# Patient Record
Sex: Male | Born: 1935
Health system: Southern US, Community
[De-identification: ages and names within clinical notes are randomized; demographics above are authoritative.]

## PROBLEM LIST (undated history)

## (undated) DIAGNOSIS — D499 Neoplasm of unspecified behavior of unspecified site: Secondary | ICD-10-CM

## (undated) DIAGNOSIS — I669 Occlusion and stenosis of unspecified cerebral artery: Secondary | ICD-10-CM

## (undated) HISTORY — DX: Neoplasm of unspecified behavior of unspecified site: D49.9

## (undated) HISTORY — DX: Occlusion and stenosis of unspecified cerebral artery: I66.9

---

## 2002-03-03 ENCOUNTER — Encounter: Payer: Self-pay | Admitting: Internal Medicine

## 2002-03-03 ENCOUNTER — Encounter: Admission: RE | Admit: 2002-03-03 | Discharge: 2002-03-03 | Payer: Self-pay | Admitting: Internal Medicine

## 2003-06-30 ENCOUNTER — Encounter: Payer: Self-pay | Admitting: Internal Medicine

## 2003-06-30 ENCOUNTER — Ambulatory Visit (HOSPITAL_COMMUNITY): Admission: RE | Admit: 2003-06-30 | Discharge: 2003-06-30 | Payer: Self-pay | Admitting: Internal Medicine

## 2004-02-15 ENCOUNTER — Ambulatory Visit (HOSPITAL_COMMUNITY): Admission: RE | Admit: 2004-02-15 | Discharge: 2004-02-15 | Payer: Self-pay | Admitting: Gastroenterology

## 2007-02-26 ENCOUNTER — Encounter: Admission: RE | Admit: 2007-02-26 | Discharge: 2007-02-26 | Payer: Self-pay | Admitting: Internal Medicine

## 2009-11-25 ENCOUNTER — Encounter: Admission: RE | Admit: 2009-11-25 | Discharge: 2009-11-25 | Payer: Self-pay | Admitting: Internal Medicine

## 2015-11-28 DIAGNOSIS — N400 Enlarged prostate without lower urinary tract symptoms: Secondary | ICD-10-CM | POA: Diagnosis not present

## 2015-11-28 DIAGNOSIS — Z0000 Encounter for general adult medical examination without abnormal findings: Secondary | ICD-10-CM | POA: Diagnosis not present

## 2015-11-28 DIAGNOSIS — E7800 Pure hypercholesterolemia, unspecified: Secondary | ICD-10-CM | POA: Diagnosis not present

## 2015-11-28 DIAGNOSIS — K219 Gastro-esophageal reflux disease without esophagitis: Secondary | ICD-10-CM | POA: Diagnosis not present

## 2015-11-28 DIAGNOSIS — Z1389 Encounter for screening for other disorder: Secondary | ICD-10-CM | POA: Diagnosis not present

## 2016-04-24 DIAGNOSIS — L309 Dermatitis, unspecified: Secondary | ICD-10-CM | POA: Diagnosis not present

## 2016-04-24 DIAGNOSIS — L570 Actinic keratosis: Secondary | ICD-10-CM | POA: Diagnosis not present

## 2016-04-24 DIAGNOSIS — Z85828 Personal history of other malignant neoplasm of skin: Secondary | ICD-10-CM | POA: Diagnosis not present

## 2016-04-24 DIAGNOSIS — C44519 Basal cell carcinoma of skin of other part of trunk: Secondary | ICD-10-CM | POA: Diagnosis not present

## 2016-04-24 DIAGNOSIS — L400 Psoriasis vulgaris: Secondary | ICD-10-CM | POA: Diagnosis not present

## 2016-04-24 DIAGNOSIS — D1801 Hemangioma of skin and subcutaneous tissue: Secondary | ICD-10-CM | POA: Diagnosis not present

## 2016-04-24 DIAGNOSIS — L821 Other seborrheic keratosis: Secondary | ICD-10-CM | POA: Diagnosis not present

## 2016-10-09 DIAGNOSIS — C44319 Basal cell carcinoma of skin of other parts of face: Secondary | ICD-10-CM | POA: Diagnosis not present

## 2016-10-09 DIAGNOSIS — L821 Other seborrheic keratosis: Secondary | ICD-10-CM | POA: Diagnosis not present

## 2016-10-09 DIAGNOSIS — Z85828 Personal history of other malignant neoplasm of skin: Secondary | ICD-10-CM | POA: Diagnosis not present

## 2016-10-09 DIAGNOSIS — D1801 Hemangioma of skin and subcutaneous tissue: Secondary | ICD-10-CM | POA: Diagnosis not present

## 2016-10-09 DIAGNOSIS — D2261 Melanocytic nevi of right upper limb, including shoulder: Secondary | ICD-10-CM | POA: Diagnosis not present

## 2016-10-09 DIAGNOSIS — L814 Other melanin hyperpigmentation: Secondary | ICD-10-CM | POA: Diagnosis not present

## 2016-10-09 DIAGNOSIS — L400 Psoriasis vulgaris: Secondary | ICD-10-CM | POA: Diagnosis not present

## 2016-10-09 DIAGNOSIS — D2262 Melanocytic nevi of left upper limb, including shoulder: Secondary | ICD-10-CM | POA: Diagnosis not present

## 2016-11-11 DIAGNOSIS — C44319 Basal cell carcinoma of skin of other parts of face: Secondary | ICD-10-CM | POA: Diagnosis not present

## 2016-11-11 DIAGNOSIS — Z85828 Personal history of other malignant neoplasm of skin: Secondary | ICD-10-CM | POA: Diagnosis not present

## 2016-12-25 DIAGNOSIS — Z961 Presence of intraocular lens: Secondary | ICD-10-CM | POA: Diagnosis not present

## 2017-02-15 DIAGNOSIS — N400 Enlarged prostate without lower urinary tract symptoms: Secondary | ICD-10-CM | POA: Diagnosis not present

## 2017-02-15 DIAGNOSIS — H5712 Ocular pain, left eye: Secondary | ICD-10-CM | POA: Diagnosis not present

## 2017-02-15 DIAGNOSIS — Z0000 Encounter for general adult medical examination without abnormal findings: Secondary | ICD-10-CM | POA: Diagnosis not present

## 2017-02-15 DIAGNOSIS — Z1389 Encounter for screening for other disorder: Secondary | ICD-10-CM | POA: Diagnosis not present

## 2017-02-15 DIAGNOSIS — K219 Gastro-esophageal reflux disease without esophagitis: Secondary | ICD-10-CM | POA: Diagnosis not present

## 2017-02-15 DIAGNOSIS — J302 Other seasonal allergic rhinitis: Secondary | ICD-10-CM | POA: Diagnosis not present

## 2017-02-15 DIAGNOSIS — R21 Rash and other nonspecific skin eruption: Secondary | ICD-10-CM | POA: Diagnosis not present

## 2017-02-15 DIAGNOSIS — E7800 Pure hypercholesterolemia, unspecified: Secondary | ICD-10-CM | POA: Diagnosis not present

## 2017-05-19 DIAGNOSIS — L821 Other seborrheic keratosis: Secondary | ICD-10-CM | POA: Diagnosis not present

## 2017-05-19 DIAGNOSIS — D225 Melanocytic nevi of trunk: Secondary | ICD-10-CM | POA: Diagnosis not present

## 2017-05-19 DIAGNOSIS — I788 Other diseases of capillaries: Secondary | ICD-10-CM | POA: Diagnosis not present

## 2017-05-19 DIAGNOSIS — Z85828 Personal history of other malignant neoplasm of skin: Secondary | ICD-10-CM | POA: Diagnosis not present

## 2017-05-19 DIAGNOSIS — L309 Dermatitis, unspecified: Secondary | ICD-10-CM | POA: Diagnosis not present

## 2017-05-19 DIAGNOSIS — C44319 Basal cell carcinoma of skin of other parts of face: Secondary | ICD-10-CM | POA: Diagnosis not present

## 2017-05-19 DIAGNOSIS — C44212 Basal cell carcinoma of skin of right ear and external auricular canal: Secondary | ICD-10-CM | POA: Diagnosis not present

## 2017-05-19 DIAGNOSIS — C4441 Basal cell carcinoma of skin of scalp and neck: Secondary | ICD-10-CM | POA: Diagnosis not present

## 2017-06-21 DIAGNOSIS — C44319 Basal cell carcinoma of skin of other parts of face: Secondary | ICD-10-CM | POA: Diagnosis not present

## 2017-06-21 DIAGNOSIS — Z85828 Personal history of other malignant neoplasm of skin: Secondary | ICD-10-CM | POA: Diagnosis not present

## 2017-12-08 DIAGNOSIS — H903 Sensorineural hearing loss, bilateral: Secondary | ICD-10-CM | POA: Diagnosis not present

## 2017-12-29 DIAGNOSIS — H903 Sensorineural hearing loss, bilateral: Secondary | ICD-10-CM | POA: Diagnosis not present

## 2018-01-07 DIAGNOSIS — R1013 Epigastric pain: Secondary | ICD-10-CM | POA: Diagnosis not present

## 2018-02-21 DIAGNOSIS — K219 Gastro-esophageal reflux disease without esophagitis: Secondary | ICD-10-CM | POA: Diagnosis not present

## 2018-02-21 DIAGNOSIS — N400 Enlarged prostate without lower urinary tract symptoms: Secondary | ICD-10-CM | POA: Diagnosis not present

## 2018-02-21 DIAGNOSIS — E7800 Pure hypercholesterolemia, unspecified: Secondary | ICD-10-CM | POA: Diagnosis not present

## 2018-02-21 DIAGNOSIS — Z1389 Encounter for screening for other disorder: Secondary | ICD-10-CM | POA: Diagnosis not present

## 2018-02-21 DIAGNOSIS — Z0000 Encounter for general adult medical examination without abnormal findings: Secondary | ICD-10-CM | POA: Diagnosis not present

## 2018-02-21 DIAGNOSIS — M25511 Pain in right shoulder: Secondary | ICD-10-CM | POA: Diagnosis not present

## 2018-03-31 DIAGNOSIS — K5900 Constipation, unspecified: Secondary | ICD-10-CM | POA: Diagnosis not present

## 2019-06-01 DIAGNOSIS — L821 Other seborrheic keratosis: Secondary | ICD-10-CM | POA: Diagnosis not present

## 2019-06-01 DIAGNOSIS — C44212 Basal cell carcinoma of skin of right ear and external auricular canal: Secondary | ICD-10-CM | POA: Diagnosis not present

## 2019-06-01 DIAGNOSIS — D1801 Hemangioma of skin and subcutaneous tissue: Secondary | ICD-10-CM | POA: Diagnosis not present

## 2019-06-01 DIAGNOSIS — C44319 Basal cell carcinoma of skin of other parts of face: Secondary | ICD-10-CM | POA: Diagnosis not present

## 2019-06-01 DIAGNOSIS — Z85828 Personal history of other malignant neoplasm of skin: Secondary | ICD-10-CM | POA: Diagnosis not present

## 2019-06-01 DIAGNOSIS — C4441 Basal cell carcinoma of skin of scalp and neck: Secondary | ICD-10-CM | POA: Diagnosis not present

## 2019-06-15 DIAGNOSIS — C44319 Basal cell carcinoma of skin of other parts of face: Secondary | ICD-10-CM | POA: Diagnosis not present

## 2019-06-15 DIAGNOSIS — C44329 Squamous cell carcinoma of skin of other parts of face: Secondary | ICD-10-CM | POA: Diagnosis not present

## 2019-06-15 DIAGNOSIS — Z85828 Personal history of other malignant neoplasm of skin: Secondary | ICD-10-CM | POA: Diagnosis not present

## 2019-06-21 DIAGNOSIS — C44319 Basal cell carcinoma of skin of other parts of face: Secondary | ICD-10-CM | POA: Diagnosis not present

## 2019-06-21 DIAGNOSIS — Z85828 Personal history of other malignant neoplasm of skin: Secondary | ICD-10-CM | POA: Diagnosis not present

## 2019-06-21 DIAGNOSIS — C44212 Basal cell carcinoma of skin of right ear and external auricular canal: Secondary | ICD-10-CM | POA: Diagnosis not present

## 2019-12-24 ENCOUNTER — Ambulatory Visit: Payer: PPO | Attending: Internal Medicine

## 2019-12-24 DIAGNOSIS — Z23 Encounter for immunization: Secondary | ICD-10-CM | POA: Insufficient documentation

## 2019-12-24 NOTE — Progress Notes (Signed)
   Covid-19 Vaccination Clinic  Name:  RYETT EASTER    MRN: [id number] DOB: February 10, 1936  12/24/2019  Mr. Bowmer was observed post Covid-19 immunization for 15 minutes without incidence. He was provided with Vaccine Information Sheet and instruction to access the V-Safe system.   Mr. Caveny was instructed to call 911 with any severe reactions post vaccine: Marland Kitchen Difficulty breathing  . Swelling of your face and throat  . A fast heartbeat  . A bad rash all over your body  . Dizziness and weakness    Immunizations Administered    Name Date Dose VIS Date Route   Pfizer COVID-19 Vaccine 12/24/2019 10:58 AM 0.3 mL 10/13/2019 Intramuscular   Manufacturer: [place]   Lot: [id number]   Teller: [id number]

## 2020-01-16 ENCOUNTER — Ambulatory Visit: Payer: PPO | Attending: Internal Medicine

## 2020-01-16 DIAGNOSIS — Z23 Encounter for immunization: Secondary | ICD-10-CM

## 2020-01-16 NOTE — Progress Notes (Signed)
   Covid-19 Vaccination Clinic  Name:  Aaron Hickman    MRN: [id number] DOB: [birth year]  01/16/2020  [name] was observed post Covid-19 immunization for 15 minutes without incident. He was provided with Vaccine Information Sheet and instruction to access the V-Safe system.   [name] was instructed to call 911 with any severe reactions post vaccine: Marland Kitchen Difficulty breathing  . Swelling of face and throat  . A fast heartbeat  . A bad rash all over body  . Dizziness and weakness   Immunizations Administered    Name Date Dose VIS Date Route   Pfizer COVID-19 Vaccine 01/16/2020 10:24 AM 0.3 mL 10/13/2019 Intramuscular   Manufacturer: [place]   Lot: [id number]   Goff: [id number]

## 2020-04-26 DIAGNOSIS — E7800 Pure hypercholesterolemia, unspecified: Secondary | ICD-10-CM | POA: Diagnosis not present

## 2020-04-26 DIAGNOSIS — D320 Benign neoplasm of cerebral meninges: Secondary | ICD-10-CM | POA: Diagnosis not present

## 2020-04-26 DIAGNOSIS — Z0000 Encounter for general adult medical examination without abnormal findings: Secondary | ICD-10-CM | POA: Diagnosis not present

## 2020-04-26 DIAGNOSIS — R55 Syncope and collapse: Secondary | ICD-10-CM | POA: Diagnosis not present

## 2020-04-26 DIAGNOSIS — N400 Enlarged prostate without lower urinary tract symptoms: Secondary | ICD-10-CM | POA: Diagnosis not present

## 2020-04-26 DIAGNOSIS — Z1389 Encounter for screening for other disorder: Secondary | ICD-10-CM | POA: Diagnosis not present

## 2020-06-03 ENCOUNTER — Ambulatory Visit
Admission: RE | Admit: 2020-06-03 | Discharge: 2020-06-03 | Disposition: A | Discharge status: Home / Self Care | Payer: PPO | Source: Ambulatory Visit | Attending: Internal Medicine | Admitting: Internal Medicine

## 2020-06-03 ENCOUNTER — Other Ambulatory Visit: Payer: Self-pay | Admitting: Internal Medicine

## 2020-06-03 DIAGNOSIS — N400 Enlarged prostate without lower urinary tract symptoms: Secondary | ICD-10-CM | POA: Diagnosis not present

## 2020-06-03 DIAGNOSIS — R1030 Lower abdominal pain, unspecified: Secondary | ICD-10-CM | POA: Diagnosis not present

## 2020-06-03 DIAGNOSIS — R1031 Right lower quadrant pain: Secondary | ICD-10-CM | POA: Diagnosis not present

## 2020-06-03 DIAGNOSIS — M419 Scoliosis, unspecified: Secondary | ICD-10-CM | POA: Diagnosis not present

## 2020-06-18 ENCOUNTER — Other Ambulatory Visit: Payer: Self-pay | Admitting: Internal Medicine

## 2020-06-18 DIAGNOSIS — R1030 Lower abdominal pain, unspecified: Secondary | ICD-10-CM

## 2020-06-27 ENCOUNTER — Ambulatory Visit
Admission: RE | Admit: 2020-06-27 | Discharge: 2020-06-27 | Disposition: A | Discharge status: Home / Self Care | Payer: PPO | Source: Ambulatory Visit | Attending: Internal Medicine | Admitting: Internal Medicine

## 2020-06-27 ENCOUNTER — Other Ambulatory Visit: Payer: Self-pay

## 2020-06-27 DIAGNOSIS — R634 Abnormal weight loss: Secondary | ICD-10-CM | POA: Diagnosis not present

## 2020-06-27 DIAGNOSIS — R1030 Lower abdominal pain, unspecified: Secondary | ICD-10-CM

## 2020-06-27 DIAGNOSIS — N400 Enlarged prostate without lower urinary tract symptoms: Secondary | ICD-10-CM | POA: Diagnosis not present

## 2020-06-27 DIAGNOSIS — K449 Diaphragmatic hernia without obstruction or gangrene: Secondary | ICD-10-CM | POA: Diagnosis not present

## 2020-08-12 DIAGNOSIS — R1032 Left lower quadrant pain: Secondary | ICD-10-CM | POA: Diagnosis not present

## 2020-08-12 DIAGNOSIS — R140 Abdominal distension (gaseous): Secondary | ICD-10-CM | POA: Diagnosis not present

## 2020-08-12 DIAGNOSIS — Z1211 Encounter for screening for malignant neoplasm of colon: Secondary | ICD-10-CM | POA: Diagnosis not present

## 2020-08-12 DIAGNOSIS — R1031 Right lower quadrant pain: Secondary | ICD-10-CM | POA: Diagnosis not present

## 2020-09-13 DIAGNOSIS — R140 Abdominal distension (gaseous): Secondary | ICD-10-CM | POA: Diagnosis not present

## 2020-09-13 DIAGNOSIS — Z1211 Encounter for screening for malignant neoplasm of colon: Secondary | ICD-10-CM | POA: Diagnosis not present

## 2020-10-01 DIAGNOSIS — H1132 Conjunctival hemorrhage, left eye: Secondary | ICD-10-CM | POA: Diagnosis not present

## 2020-10-01 DIAGNOSIS — H52223 Regular astigmatism, bilateral: Secondary | ICD-10-CM | POA: Diagnosis not present

## 2020-10-01 DIAGNOSIS — H5203 Hypermetropia, bilateral: Secondary | ICD-10-CM | POA: Diagnosis not present

## 2020-11-11 ENCOUNTER — Other Ambulatory Visit: Payer: Self-pay | Admitting: Physician Assistant

## 2020-11-11 DIAGNOSIS — Z1211 Encounter for screening for malignant neoplasm of colon: Secondary | ICD-10-CM

## 2020-11-11 DIAGNOSIS — R140 Abdominal distension (gaseous): Secondary | ICD-10-CM

## 2020-11-27 ENCOUNTER — Ambulatory Visit
Admission: RE | Admit: 2020-11-27 | Discharge: 2020-11-27 | Disposition: A | Discharge status: Home / Self Care | Payer: PPO | Source: Ambulatory Visit | Attending: Physician Assistant | Admitting: Physician Assistant

## 2020-11-27 DIAGNOSIS — R140 Abdominal distension (gaseous): Secondary | ICD-10-CM

## 2020-11-27 DIAGNOSIS — Z1211 Encounter for screening for malignant neoplasm of colon: Secondary | ICD-10-CM

## 2021-02-04 DIAGNOSIS — D1801 Hemangioma of skin and subcutaneous tissue: Secondary | ICD-10-CM | POA: Diagnosis not present

## 2021-02-04 DIAGNOSIS — L821 Other seborrheic keratosis: Secondary | ICD-10-CM | POA: Diagnosis not present

## 2021-02-04 DIAGNOSIS — D2261 Melanocytic nevi of right upper limb, including shoulder: Secondary | ICD-10-CM | POA: Diagnosis not present

## 2021-02-04 DIAGNOSIS — L910 Hypertrophic scar: Secondary | ICD-10-CM | POA: Diagnosis not present

## 2021-02-04 DIAGNOSIS — Z85828 Personal history of other malignant neoplasm of skin: Secondary | ICD-10-CM | POA: Diagnosis not present

## 2021-02-04 DIAGNOSIS — D2262 Melanocytic nevi of left upper limb, including shoulder: Secondary | ICD-10-CM | POA: Diagnosis not present

## 2021-02-04 DIAGNOSIS — D225 Melanocytic nevi of trunk: Secondary | ICD-10-CM | POA: Diagnosis not present

## 2021-02-04 DIAGNOSIS — L237 Allergic contact dermatitis due to plants, except food: Secondary | ICD-10-CM | POA: Diagnosis not present

## 2021-02-04 DIAGNOSIS — L814 Other melanin hyperpigmentation: Secondary | ICD-10-CM | POA: Diagnosis not present

## 2021-05-30 DIAGNOSIS — R55 Syncope and collapse: Secondary | ICD-10-CM | POA: Diagnosis not present

## 2021-05-30 DIAGNOSIS — R519 Headache, unspecified: Secondary | ICD-10-CM | POA: Diagnosis not present

## 2021-05-30 DIAGNOSIS — K219 Gastro-esophageal reflux disease without esophagitis: Secondary | ICD-10-CM | POA: Diagnosis not present

## 2021-05-30 DIAGNOSIS — Z1389 Encounter for screening for other disorder: Secondary | ICD-10-CM | POA: Diagnosis not present

## 2021-05-30 DIAGNOSIS — N400 Enlarged prostate without lower urinary tract symptoms: Secondary | ICD-10-CM | POA: Diagnosis not present

## 2021-05-30 DIAGNOSIS — Z0000 Encounter for general adult medical examination without abnormal findings: Secondary | ICD-10-CM | POA: Diagnosis not present

## 2021-05-30 DIAGNOSIS — E7800 Pure hypercholesterolemia, unspecified: Secondary | ICD-10-CM | POA: Diagnosis not present

## 2021-06-04 ENCOUNTER — Other Ambulatory Visit: Payer: Self-pay | Admitting: Internal Medicine

## 2021-06-04 DIAGNOSIS — D320 Benign neoplasm of cerebral meninges: Secondary | ICD-10-CM

## 2021-06-23 ENCOUNTER — Ambulatory Visit
Admission: RE | Admit: 2021-06-23 | Discharge: 2021-06-23 | Disposition: A | Discharge status: Home / Self Care | Payer: PPO | Source: Ambulatory Visit | Attending: Internal Medicine | Admitting: Internal Medicine

## 2021-06-23 DIAGNOSIS — G939 Disorder of brain, unspecified: Secondary | ICD-10-CM | POA: Diagnosis not present

## 2021-06-23 DIAGNOSIS — D320 Benign neoplasm of cerebral meninges: Secondary | ICD-10-CM

## 2021-06-23 DIAGNOSIS — D329 Benign neoplasm of meninges, unspecified: Secondary | ICD-10-CM | POA: Diagnosis not present

## 2021-06-23 MED ORDER — GADOBENATE DIMEGLUMINE 529 MG/ML IV SOLN
17.0000 mL | Freq: Once | INTRAVENOUS | Status: AC | PRN
  Administered 2021-06-23: 17 mL via INTRAVENOUS

## 2021-06-25 DIAGNOSIS — D649 Anemia, unspecified: Secondary | ICD-10-CM | POA: Diagnosis not present

## 2021-06-25 DIAGNOSIS — D320 Benign neoplasm of cerebral meninges: Secondary | ICD-10-CM | POA: Diagnosis not present

## 2021-06-25 DIAGNOSIS — G939 Disorder of brain, unspecified: Secondary | ICD-10-CM | POA: Diagnosis not present

## 2021-07-02 ENCOUNTER — Other Ambulatory Visit: Payer: Self-pay | Admitting: Internal Medicine

## 2021-07-02 ENCOUNTER — Telehealth: Payer: Self-pay

## 2021-07-02 DIAGNOSIS — R911 Solitary pulmonary nodule: Secondary | ICD-10-CM

## 2021-07-02 MED ORDER — DIPHENHYDRAMINE HCL 50 MG PO TABS
50.0000 mg | ORAL_TABLET | Freq: Once | ORAL | 0 refills | Status: DC
Start: 2021-07-02 — End: 2022-09-14

## 2021-07-02 MED ORDER — PREDNISONE 50 MG PO TABS
ORAL_TABLET | ORAL | 0 refills | Status: DC
Start: 2021-07-02 — End: 2022-09-14

## 2021-07-02 NOTE — Telephone Encounter (Signed)
Phone call to patient to review instructions for 13 hr prep for CT w/ contrast on 07/21/21 at 3:20PM. Prescription called into [facility]. Pt aware and verbalized understanding of instructions.  Prescription: Pt to take 50 mg of prednisone on 07/21/21 at 02:20AM, 50 mg of prednisone on 07/21/21 at 08:20AM, and 50 mg of prednisone on 07/21/21 at 2:20PM. Pt is also to take 50 mg of benadryl on 07/21/21 at 2:20PM. Please call [phone] with any questions.

## 2021-07-21 ENCOUNTER — Other Ambulatory Visit: Payer: Self-pay

## 2021-07-21 ENCOUNTER — Ambulatory Visit
Admission: RE | Admit: 2021-07-21 | Discharge: 2021-07-21 | Disposition: A | Discharge status: Home / Self Care | Payer: PPO | Source: Ambulatory Visit | Attending: Internal Medicine | Admitting: Internal Medicine

## 2021-07-21 DIAGNOSIS — K449 Diaphragmatic hernia without obstruction or gangrene: Secondary | ICD-10-CM | POA: Diagnosis not present

## 2021-07-21 DIAGNOSIS — R911 Solitary pulmonary nodule: Secondary | ICD-10-CM

## 2021-07-21 DIAGNOSIS — M5134 Other intervertebral disc degeneration, thoracic region: Secondary | ICD-10-CM | POA: Diagnosis not present

## 2021-07-21 DIAGNOSIS — J439 Emphysema, unspecified: Secondary | ICD-10-CM | POA: Diagnosis not present

## 2021-07-21 DIAGNOSIS — I2510 Atherosclerotic heart disease of native coronary artery without angina pectoris: Secondary | ICD-10-CM | POA: Diagnosis not present

## 2021-07-21 MED ORDER — IOPAMIDOL (ISOVUE-370) INJECTION 76%
60.0000 mL | Freq: Once | INTRAVENOUS | Status: AC | PRN
  Administered 2021-07-21: 60 mL via INTRAVENOUS

## 2021-08-11 ENCOUNTER — Other Ambulatory Visit: Payer: Self-pay | Admitting: Internal Medicine

## 2021-08-11 DIAGNOSIS — E538 Deficiency of other specified B group vitamins: Secondary | ICD-10-CM | POA: Diagnosis not present

## 2021-08-11 DIAGNOSIS — G9389 Other specified disorders of brain: Secondary | ICD-10-CM

## 2022-01-02 ENCOUNTER — Ambulatory Visit
Admission: RE | Admit: 2022-01-02 | Discharge: 2022-01-02 | Disposition: A | Discharge status: Home / Self Care | Payer: PPO | Source: Ambulatory Visit | Attending: Internal Medicine | Admitting: Internal Medicine

## 2022-01-02 DIAGNOSIS — G9389 Other specified disorders of brain: Secondary | ICD-10-CM | POA: Diagnosis not present

## 2022-01-02 DIAGNOSIS — C719 Malignant neoplasm of brain, unspecified: Secondary | ICD-10-CM | POA: Diagnosis not present

## 2022-01-02 MED ORDER — GADOBENATE DIMEGLUMINE 529 MG/ML IV SOLN
15.0000 mL | Freq: Once | INTRAVENOUS | Status: AC | PRN
  Administered 2022-01-02: 15 mL via INTRAVENOUS

## 2022-01-14 ENCOUNTER — Other Ambulatory Visit: Payer: Self-pay | Admitting: Radiation Therapy

## 2022-01-14 DIAGNOSIS — Z6827 Body mass index (BMI) 27.0-27.9, adult: Secondary | ICD-10-CM | POA: Diagnosis not present

## 2022-01-14 DIAGNOSIS — D320 Benign neoplasm of cerebral meninges: Secondary | ICD-10-CM | POA: Diagnosis not present

## 2022-01-19 ENCOUNTER — Inpatient Hospital Stay: Payer: PPO | Attending: Neurosurgery

## 2022-01-22 DIAGNOSIS — G936 Cerebral edema: Secondary | ICD-10-CM | POA: Diagnosis not present

## 2022-01-22 DIAGNOSIS — D320 Benign neoplasm of cerebral meninges: Secondary | ICD-10-CM | POA: Diagnosis not present

## 2022-01-22 DIAGNOSIS — I671 Cerebral aneurysm, nonruptured: Secondary | ICD-10-CM | POA: Diagnosis not present

## 2022-01-22 DIAGNOSIS — Z923 Personal history of irradiation: Secondary | ICD-10-CM | POA: Diagnosis not present

## 2022-01-22 DIAGNOSIS — D329 Benign neoplasm of meninges, unspecified: Secondary | ICD-10-CM | POA: Diagnosis not present

## 2022-01-22 DIAGNOSIS — G9389 Other specified disorders of brain: Secondary | ICD-10-CM | POA: Diagnosis not present

## 2022-02-16 DIAGNOSIS — C44319 Basal cell carcinoma of skin of other parts of face: Secondary | ICD-10-CM | POA: Diagnosis not present

## 2022-02-16 DIAGNOSIS — D692 Other nonthrombocytopenic purpura: Secondary | ICD-10-CM | POA: Diagnosis not present

## 2022-02-16 DIAGNOSIS — C4441 Basal cell carcinoma of skin of scalp and neck: Secondary | ICD-10-CM | POA: Diagnosis not present

## 2022-02-16 DIAGNOSIS — L821 Other seborrheic keratosis: Secondary | ICD-10-CM | POA: Diagnosis not present

## 2022-02-16 DIAGNOSIS — Z85828 Personal history of other malignant neoplasm of skin: Secondary | ICD-10-CM | POA: Diagnosis not present

## 2022-02-16 DIAGNOSIS — L814 Other melanin hyperpigmentation: Secondary | ICD-10-CM | POA: Diagnosis not present

## 2022-02-16 DIAGNOSIS — L570 Actinic keratosis: Secondary | ICD-10-CM | POA: Diagnosis not present

## 2022-04-24 DIAGNOSIS — D329 Benign neoplasm of meninges, unspecified: Secondary | ICD-10-CM | POA: Diagnosis not present

## 2022-04-30 DIAGNOSIS — D329 Benign neoplasm of meninges, unspecified: Secondary | ICD-10-CM | POA: Diagnosis not present

## 2022-04-30 DIAGNOSIS — D320 Benign neoplasm of cerebral meninges: Secondary | ICD-10-CM | POA: Diagnosis not present

## 2022-05-20 DIAGNOSIS — N400 Enlarged prostate without lower urinary tract symptoms: Secondary | ICD-10-CM | POA: Diagnosis not present

## 2022-05-20 DIAGNOSIS — R1032 Left lower quadrant pain: Secondary | ICD-10-CM | POA: Diagnosis not present

## 2022-06-03 DIAGNOSIS — L821 Other seborrheic keratosis: Secondary | ICD-10-CM | POA: Diagnosis not present

## 2022-06-03 DIAGNOSIS — Z85828 Personal history of other malignant neoplasm of skin: Secondary | ICD-10-CM | POA: Diagnosis not present

## 2022-06-03 DIAGNOSIS — L905 Scar conditions and fibrosis of skin: Secondary | ICD-10-CM | POA: Diagnosis not present

## 2022-06-12 DIAGNOSIS — E7800 Pure hypercholesterolemia, unspecified: Secondary | ICD-10-CM | POA: Diagnosis not present

## 2022-06-12 DIAGNOSIS — E538 Deficiency of other specified B group vitamins: Secondary | ICD-10-CM | POA: Diagnosis not present

## 2022-06-12 DIAGNOSIS — N400 Enlarged prostate without lower urinary tract symptoms: Secondary | ICD-10-CM | POA: Diagnosis not present

## 2022-06-12 DIAGNOSIS — R251 Tremor, unspecified: Secondary | ICD-10-CM | POA: Diagnosis not present

## 2022-06-12 DIAGNOSIS — D320 Benign neoplasm of cerebral meninges: Secondary | ICD-10-CM | POA: Diagnosis not present

## 2022-06-12 DIAGNOSIS — Z1331 Encounter for screening for depression: Secondary | ICD-10-CM | POA: Diagnosis not present

## 2022-06-12 DIAGNOSIS — Z23 Encounter for immunization: Secondary | ICD-10-CM | POA: Diagnosis not present

## 2022-06-12 DIAGNOSIS — Z0000 Encounter for general adult medical examination without abnormal findings: Secondary | ICD-10-CM | POA: Diagnosis not present

## 2022-06-12 DIAGNOSIS — I700 Atherosclerosis of aorta: Secondary | ICD-10-CM | POA: Diagnosis not present

## 2022-06-12 DIAGNOSIS — K219 Gastro-esophageal reflux disease without esophagitis: Secondary | ICD-10-CM | POA: Diagnosis not present

## 2022-06-23 ENCOUNTER — Encounter: Payer: Self-pay | Admitting: Neurology

## 2022-07-22 DIAGNOSIS — H02831 Dermatochalasis of right upper eyelid: Secondary | ICD-10-CM | POA: Diagnosis not present

## 2022-07-22 DIAGNOSIS — H26492 Other secondary cataract, left eye: Secondary | ICD-10-CM | POA: Diagnosis not present

## 2022-07-22 DIAGNOSIS — H18413 Arcus senilis, bilateral: Secondary | ICD-10-CM | POA: Diagnosis not present

## 2022-07-22 DIAGNOSIS — H26493 Other secondary cataract, bilateral: Secondary | ICD-10-CM | POA: Diagnosis not present

## 2022-07-31 DIAGNOSIS — H5202 Hypermetropia, left eye: Secondary | ICD-10-CM | POA: Diagnosis not present

## 2022-07-31 DIAGNOSIS — H26491 Other secondary cataract, right eye: Secondary | ICD-10-CM | POA: Diagnosis not present

## 2022-07-31 DIAGNOSIS — Z9842 Cataract extraction status, left eye: Secondary | ICD-10-CM | POA: Diagnosis not present

## 2022-07-31 DIAGNOSIS — H524 Presbyopia: Secondary | ICD-10-CM | POA: Diagnosis not present

## 2022-07-31 DIAGNOSIS — H52223 Regular astigmatism, bilateral: Secondary | ICD-10-CM | POA: Diagnosis not present

## 2022-07-31 DIAGNOSIS — Z961 Presence of intraocular lens: Secondary | ICD-10-CM | POA: Diagnosis not present

## 2022-08-04 ENCOUNTER — Encounter: Payer: Self-pay | Admitting: Neurology

## 2022-08-04 ENCOUNTER — Ambulatory Visit: Payer: PPO | Admitting: Neurology

## 2022-08-04 DIAGNOSIS — Z029 Encounter for administrative examinations, unspecified: Secondary | ICD-10-CM

## 2022-08-24 DIAGNOSIS — H26491 Other secondary cataract, right eye: Secondary | ICD-10-CM | POA: Diagnosis not present

## 2022-08-31 DIAGNOSIS — H52223 Regular astigmatism, bilateral: Secondary | ICD-10-CM | POA: Diagnosis not present

## 2022-08-31 DIAGNOSIS — Z961 Presence of intraocular lens: Secondary | ICD-10-CM | POA: Diagnosis not present

## 2022-08-31 DIAGNOSIS — Z9841 Cataract extraction status, right eye: Secondary | ICD-10-CM | POA: Diagnosis not present

## 2022-08-31 DIAGNOSIS — H5202 Hypermetropia, left eye: Secondary | ICD-10-CM | POA: Diagnosis not present

## 2022-08-31 DIAGNOSIS — Z9842 Cataract extraction status, left eye: Secondary | ICD-10-CM | POA: Diagnosis not present

## 2022-08-31 DIAGNOSIS — H524 Presbyopia: Secondary | ICD-10-CM | POA: Diagnosis not present

## 2022-09-14 ENCOUNTER — Encounter: Payer: Self-pay | Admitting: Neurology

## 2022-09-14 ENCOUNTER — Ambulatory Visit: Payer: PPO | Admitting: Neurology

## 2022-09-14 VITALS — BP 137/97 | HR 102 | Ht 69.0 in | Wt 175.6 lb

## 2022-09-14 DIAGNOSIS — R442 Other hallucinations: Secondary | ICD-10-CM | POA: Diagnosis not present

## 2022-09-14 DIAGNOSIS — G9389 Other specified disorders of brain: Secondary | ICD-10-CM | POA: Diagnosis not present

## 2022-09-14 DIAGNOSIS — R413 Other amnesia: Secondary | ICD-10-CM | POA: Diagnosis not present

## 2022-09-14 NOTE — Patient Instructions (Signed)
Good to meet you.  Schedule EEG. Our office will call with results and next steps  2. Follow-up with Neurosurgery as scheduled for repeat MRI  3. Follow-up in 3 months, call for any changes   The name of the hearing device is Cabin crew. You can check it out on [employer].

## 2022-09-14 NOTE — Progress Notes (Signed)
NEUROLOGY CONSULTATION NOTE  Aaron Hickman MRN: [id number] DOB: October 18, 1936  Referring provider: Dr. Lavone Orn Primary care provider: Dr. Lavone Orn  Reason for consult:  meningioma, unusual taste/smell sensation with tingling and tremor  Dear Dr Laurann Montana:  Thank you for your kind referral of Aaron Hickman for consultation of the above symptoms. Although his history is well known to you, please allow me to reiterate it for the purpose of our medical record. The patient was accompanied to the clinic by his wife and sister-in-law who also provide collateral information. Records and images were personally reviewed where available.   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 He is a poor historian but also may be due to significant hearing loss. He and his wife report recurrent episodes of a metallic taste in his mouth where he may get dizzy, reports feeling sick, then he would have slight hand tremors/trembling lasting a few seconds. He reported tingling to his PCP, however he denies any focal numbness/tingling/weakness today. His wife reports that these episodes would make him "lose his memory" for a few seconds. He had seen his neurosurgeon at [facility] in March and was given a 41-monthDecadron taper. He states that since starting the Decadron, the episodes stopped. The last episode of metallic taste was 4-5 months ago. His wife however reports an episode this morning where he had hand tremors and lost his memory for a few seconds. He could not remember our office. They deny any staring/unresponsive episodes. [facility] but cannot give more details about it,  no convulsive activity. His wife states it was "not long," no tongue bite or incontinence. He denies any headaches, dizziness, vision changes, focal numbness/tingling/weakness, myoclonic jerks. He drives. Sleep is good.   [facility]2022 [facility] for review[facility] "There is a right middle cranial fossa dural based mass medially with heterogeneous enhancement and extension into the lateral aspect of the right cavernous sinus consistent with a treated meningioma. This has grown compared with 2022 appears similar to the exam from 01/02/2022. An anterior right temporal lobe mass has also enlarged slightly from 2022 but similar to 2023. This lesion appears noncontiguous with the enhancement of the middle cranial fossa meningioma and is associated with some susceptibility artifact from hemorrhage or calcification. 84 differential for the temporal lobe lesion includes meningioma invasion of the right temporal lobe given a subtle progression over time, enhancement associated with organized hemorrhage, and a vascular malformation related to radiation treatment. The meningioma lateral to the right temporal lobe posteriorly appears minimally enlarged compared with earlier 2023 exam.  There is extensive edema greatest in the right temporal lobe and also extending into portions of the right frontal and parietal white matter and into the internal and external capsule. This may be secondary to biological effects of the right middle cranial fossa meningioma and appears similar to 01/02/2022 but increased compared with 2022."   Patient refused biopsy or open surgical intervention to obtain pathology, currently on close clinical monitoring.    PAST MEDICAL HISTORY: Past Medical History:  Diagnosis Date   Blood clots in brain    Tumors  in head    PAST SURGICAL HISTORY: History reviewed. No pertinent surgical history.  MEDICATIONS: Current Outpatient Medications on  File Prior to Visit  Medication Sig Dispense Refill   atorvastatin (LIPITOR) 40 MG tablet Take 40 mg by mouth daily.     Cholecalciferol (D3) 50 MCG (2000 UT) TABS Take 1 tablet by mouth daily.     finasteride (PROSCAR) 5 MG tablet Take 5 mg by mouth daily.     tamsulosin (FLOMAX) 0.4 MG CAPS capsule SMARTSIG:1 Capsule(s) By Mouth Every Evening     No current facility-administered medications on file prior to visit.    ALLERGIES: Allergies  Allergen Reactions   Iodinated Contrast Media Itching    FAMILY HISTORY: History reviewed. No pertinent family history.  SOCIAL HISTORY: Social History   Socioeconomic History   Marital status: Married    Spouse name: Not on file   Number of children: Not on file   Years of education: Not on file   Highest education level: Not on file  Occupational History   Not on file  Tobacco Use   Smoking status: Former    Types: Cigarettes   Smokeless tobacco: Never  Vaping Use   Vaping Use: Never used  Substance and Sexual Activity   Alcohol use: Not Currently   Drug use: Never   Sexual activity: Not on file  Other Topics Concern   Not on file  Social History Narrative   Are you right handed or left handed? Right handed    Are you currently employed ? 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 NO   Who lives with you? With family    What type of home do you live in: 1 story or 2 story? One story        Social Determinants of Radio broadcast assistant Strain: Not on file  Food Insecurity: Not on file  Transportation Needs: Not on file  Physical Activity: Not on file  Stress: Not on file  Social Connections: Not on file  Intimate Partner Violence: Not on file     PHYSICAL EXAM: Vitals:   09/14/22 1007  BP: (!) 137/97  Pulse: (!) 102  SpO2: 96%   General: No acute distress Head:  Normocephalic/atraumatic Skin/Extremities: No rash, no edema Neurological Exam: Mental status: alert and  awake, no dysarthria or aphasia, Fund of knowledge is appropriate.  Attention and concentration are normal.   Cranial nerves: CN I: not tested CN II: pupils equal, round, visual fields intact CN III, IV, VI:  full range of motion, no nystagmus, no ptosis CN V: facial sensation intact CN VII: upper and lower face symmetric CN VIII: hearing intact to conversation Bulk & Tone: normal, no fasciculations. Motor: 5/5 throughout with no pronator drift. Sensation: intact to light touch, cold, pin, vibration sense.  No extinction to double simultaneous stimulation.  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 They were reporting recurrent episodes of a metallic taste in his mouth where he may get dizzy, reports feeling sick, then he  would have slight hand tremors/trembling lasting a few seconds. His wife reports that these episodes would make him "lose his memory" for a few seconds. Episodes concerning for seizures arising from temporal lobe mass. He denies any further episodes since Decadron was started, however his wife reports the brief episodes of "memory loss" with trembling continue. EEG will be ordered. Low threshold to start seizure medication. Follow-up with Neurosurgery as scheduled for repeat brain MRI, he has declined biopsy or open surgical intervention to obtain pathology, currently on close clinical monitoring. [place] driving laws were discussed with the patient, and he knows to stop driving after an episode of loss of awareness until 6 months event-free.  Follow-up in 3 months, call for any changes.    Thank you for allowing me to participate in the care of this patient. Please do not hesitate to call for any questions or concerns.   Ellouise Newer, M.D.  CC: Dr. Laurann Montana

## 2022-09-29 ENCOUNTER — Other Ambulatory Visit: Payer: Self-pay | Admitting: Internal Medicine

## 2022-09-29 ENCOUNTER — Ambulatory Visit
Admission: RE | Admit: 2022-09-29 | Discharge: 2022-09-29 | Disposition: A | Discharge status: Home / Self Care | Payer: PPO | Source: Ambulatory Visit | Attending: Internal Medicine | Admitting: Internal Medicine

## 2022-09-29 DIAGNOSIS — R109 Unspecified abdominal pain: Secondary | ICD-10-CM | POA: Diagnosis not present

## 2022-09-29 DIAGNOSIS — J310 Chronic rhinitis: Secondary | ICD-10-CM | POA: Diagnosis not present

## 2022-09-29 DIAGNOSIS — R1033 Periumbilical pain: Secondary | ICD-10-CM | POA: Diagnosis not present

## 2022-09-29 DIAGNOSIS — G939 Disorder of brain, unspecified: Secondary | ICD-10-CM | POA: Diagnosis not present

## 2022-10-01 ENCOUNTER — Ambulatory Visit (INDEPENDENT_AMBULATORY_CARE_PROVIDER_SITE_OTHER): Payer: PPO | Admitting: Neurology

## 2022-10-01 DIAGNOSIS — R413 Other amnesia: Secondary | ICD-10-CM | POA: Diagnosis not present

## 2022-10-01 DIAGNOSIS — R442 Other hallucinations: Secondary | ICD-10-CM

## 2022-10-01 NOTE — Progress Notes (Signed)
EEG complete - results pending 

## 2022-10-07 ENCOUNTER — Telehealth: Payer: Self-pay | Admitting: Neurology

## 2022-10-07 MED ORDER — OXCARBAZEPINE 150 MG PO TABS: 150.0000 mg | ORAL_TABLET | Freq: Two times a day (BID) | ORAL | 5 refills | Status: DC

## 2022-10-07 NOTE — Procedures (Signed)
ELECTROENCEPHALOGRAM REPORT  Date of Study: 10/01/2022  Patient's Name: Aaron Hickman MRN: [id number] Date of Birth: 1936-10-29  Referring Provider: Dr. Ellouise Newer  Clinical History: This is an 86 year old man with right sphenoid wing meningioma, right temporal lobe mass with edema, with recurrent episodes of a metallic taste and episodes where he would "lose memory." EEG for classification.  Medications: Atorvastatin, Flomax, Proscar  Technical Summary: A multichannel digital EEG recording measured by the international 10-20 system with electrodes applied with paste and impedances below 5000 ohms performed in our laboratory with EKG monitoring in an awake and asleep patient.  Hyperventilation was not performed. Photic stimulation was performed.  The digital EEG was referentially recorded, reformatted, and digitally filtered in a variety of bipolar and referential montages for optimal display.    Description: The patient is awake and asleep during the recording.  During maximal wakefulness, there is a symmetric, medium voltage 8-9 Hz posterior dominant rhythm that attenuates with eye opening.  There is occasional focal 4-5 Hz and 2-3 Hz delta slowing over the right temporal region. During drowsiness and sleep, there is an increase in theta slowing of the background.  Vertex waves and symmetric sleep spindles were seen. Photic stimulation did not elicit any abnormalities.  There were rare sharp waves over the right frontotemporal region. No electrographic seizures seen.    EKG lead was unremarkable.  Impression: This awake and asleep EEG is abnormal due to the presence of: Occasional focal slowing over the right temporal region Rare epileptiform discharges over the right frontotemporal region  Clinical Correlation of the above findings indicates focal cerebral dysfunction over the right temporal region suggestive of underlying structural or physiologic abnormality. There is a tendency  for seizures to arise from this region. Clinical correlation is advised.   Ellouise Newer, M.D.

## 2022-10-07 NOTE — Telephone Encounter (Signed)
Spoke to wife. Discussed EEG showing slowing over right hemisphere and epileptiform discharges over the right frontotemporal region. Since his last visit, she has seen a couple of episodes of "memory loss" but "not bad." He has not had shaking in a while. He has not complained of metallic taste. Discussed recommendation to start seizure medication, oxcarbazepine '150mg'$  BID. Side effects discussed. He has a f/u with [facility] later this month.

## 2022-10-29 DIAGNOSIS — Z91199 Patient's noncompliance with other medical treatment and regimen due to unspecified reason: Secondary | ICD-10-CM | POA: Diagnosis not present

## 2022-10-29 DIAGNOSIS — G936 Cerebral edema: Secondary | ICD-10-CM | POA: Diagnosis not present

## 2022-10-29 DIAGNOSIS — D320 Benign neoplasm of cerebral meninges: Secondary | ICD-10-CM | POA: Diagnosis not present

## 2022-10-29 DIAGNOSIS — I671 Cerebral aneurysm, nonruptured: Secondary | ICD-10-CM | POA: Diagnosis not present

## 2022-10-29 DIAGNOSIS — G9389 Other specified disorders of brain: Secondary | ICD-10-CM | POA: Diagnosis not present

## 2022-10-29 DIAGNOSIS — D329 Benign neoplasm of meninges, unspecified: Secondary | ICD-10-CM | POA: Diagnosis not present

## 2022-10-30 DIAGNOSIS — E871 Hypo-osmolality and hyponatremia: Secondary | ICD-10-CM | POA: Diagnosis not present

## 2022-10-30 DIAGNOSIS — R4189 Other symptoms and signs involving cognitive functions and awareness: Secondary | ICD-10-CM | POA: Diagnosis not present

## 2022-10-30 DIAGNOSIS — D320 Benign neoplasm of cerebral meninges: Secondary | ICD-10-CM | POA: Diagnosis not present

## 2022-10-30 DIAGNOSIS — E440 Moderate protein-calorie malnutrition: Secondary | ICD-10-CM | POA: Diagnosis not present

## 2023-01-04 ENCOUNTER — Ambulatory Visit: Payer: PPO | Admitting: Neurology

## 2023-01-04 ENCOUNTER — Encounter: Payer: Self-pay | Admitting: Neurology

## 2023-01-04 VITALS — BP 113/76 | HR 77 | Ht 70.0 in | Wt 167.5 lb

## 2023-01-04 DIAGNOSIS — G40209 Localization-related (focal) (partial) symptomatic epilepsy and epileptic syndromes with complex partial seizures, not intractable, without status epilepticus: Secondary | ICD-10-CM | POA: Diagnosis not present

## 2023-01-04 DIAGNOSIS — G9389 Other specified disorders of brain: Secondary | ICD-10-CM

## 2023-01-04 MED ORDER — OXCARBAZEPINE 150 MG PO TABS: 150.0000 mg | ORAL_TABLET | Freq: Two times a day (BID) | ORAL | 3 refills | Status: DC

## 2023-01-04 NOTE — Patient Instructions (Signed)
Good to see you doing well. Continue Oxcarbazepine '150mg'$  twice a day. Follow-up in 6 months, call for any changes.    Seizure Precautions: 1. If medication has been prescribed for you to prevent seizures, take it exactly as directed.  Do not stop taking the medicine without talking to your doctor first, even if you have not had a seizure in a long time.   2. Avoid activities in which a seizure would cause danger to yourself or to others.  Don't operate dangerous machinery, swim alone, or climb in high or dangerous places, such as on ladders, roofs, or girders.  Do not drive unless your doctor says you may.  3. If you have any warning that you may have a seizure, lay down in a safe place where you can't hurt yourself.    4.  No driving for 6 months from last seizure, as per [facility].   Please refer to the following link on the [facility] website for more information: http://[email or web address]/[email or web address]/driving/drivingu.cfm   5.  Maintain good sleep hygiene. Avoid alcohol.  6.  Contact your doctor if you have any problems that may be related to the medicine you are taking.  7.  Call 911 and bring the patient back to the ED if:        A.  The seizure lasts longer than 5 minutes.       B.  The patient doesn't awaken shortly after the seizure  C.  The patient has new problems such as difficulty seeing, speaking or moving  D.  The patient was injured during the seizure  E.  The patient has a temperature over 102 F (39C)  F.  The patient vomited and now is having trouble breathing

## 2023-01-04 NOTE — Progress Notes (Signed)
NEUROLOGY FOLLOW UP OFFICE NOTE  [name] [id number] Jul 30, 1936  HISTORY OF PRESENT ILLNESS: I had the pleasure of seeing Aaron Hickman in follow-up in the neurology clinic on 01/04/2023.  The patient was last seen 4 months ago for seizures secondary to right temporal lobe mass. He is again accompanied by his wife who helps supplement the history today.  Records and images were personally reviewed where available.  He presented for evaluation of gustatory hallucinations with some tremors/trembling and memory loss for a few seconds. EEG in 09/2022 showed occasional focal slowing over the right temporal region and rare epileptiform discharges over the right frontotemporal region. He was started on oxcarbazepine '150mg'$  BID. His wife is happy to report that he has been doing well with no further spells since starting medication. He denies any olfactory/gustatory hallucinations, focal numbness/tingling/weakness, myoclonic jerks. No headaches, dizziness, double vision, no falls. Sleep and mood are good.   His last brain MRI with and without contrast at [place] done 10/29/22 did not show evidence for progression. There was similar size and appearance of dural-based enhancing lesions along the medial aspect of the right middle cranial fossa and along the posterior right temporal convexity, mild interval decrease in size of an intra-axial enhancing lesion in the anterior right temporal lobe with slight interval decrease in extent of edema in the right cerebral hemisphere. He was seen by Neurosurgery with plans for continued observation.   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 He is a poor historian but  also may be due to significant hearing loss. He and his wife report recurrent episodes of a metallic taste in his mouth where he may get dizzy, reports feeling sick, then he would have slight hand tremors/trembling lasting a few seconds. He reported tingling to his PCP, however he denies any focal numbness/tingling/weakness today. His wife reports that these episodes would make him "lose his memory" for a few seconds. He had seen his neurosurgeon [facility] in March and was given a 75-monthDecadron taper. He states that since starting the Decadron, the episodes stopped. The last episode of metallic taste was 4-5 months ago. His wife however reports an episode this morning where he had hand tremors and lost his memory for a few seconds. He could not remember our office. They deny any staring/unresponsive episodes. [facility] but cannot give more details about it, no convulsive activity. His wife states it was "not long," no tongue bite or incontinence. He denies any headaches, dizziness, vision changes, focal numbness/tingling/weakness, myoclonic jerks. He drives. Sleep is good.   [facility] "There is a right middle cranial fossa dural based mass medially with heterogeneous enhancement and extension into the lateral aspect of the right cavernous sinus consistent with a treated meningioma. This has grown compared with 2022 appears similar to the exam from 01/02/2022. An anterior right temporal lobe mass has also enlarged slightly from 2022 but similar to 2023. This lesion appears noncontiguous with the enhancement of the middle cranial fossa meningioma and is associated with some susceptibility artifact from hemorrhage or calcification. A differential for the temporal lobe  lesion includes meningioma invasion of the right temporal lobe given a subtle progression over time,  enhancement associated with organized hemorrhage, and a vascular malformation related to radiation treatment. The meningioma lateral to the right temporal lobe posteriorly appears minimally enlarged compared with earlier 2023 exam.  There is extensive edema greatest in the right temporal lobe and also extending into portions of the right frontal and parietal white matter and into the internal and external capsule. This may be secondary to biological effects of the right middle cranial fossa meningioma and appears similar to 01/02/2022 but increased compared with 2022."   Patient refused biopsy or open surgical intervention to obtain pathology, currently on close clinical monitoring.    PAST MEDICAL HISTORY: Past Medical History:  Diagnosis Date   Blood clots in brain    Tumors    in head    MEDICATIONS: Current Outpatient Medications on File Prior to Visit  Medication Sig Dispense Refill   atorvastatin (LIPITOR) 40 MG tablet Take 40 mg by mouth daily.     Cholecalciferol (D3) 50 MCG (2000 UT) TABS Take 1 tablet by mouth daily.     finasteride (PROSCAR) 5 MG tablet Take 5 mg by mouth daily.     OXcarbazepine (TRILEPTAL) 150 MG tablet Take 1 tablet (150 mg total) by mouth 2 (two) times daily. 60 tablet 5   tamsulosin (FLOMAX) 0.4 MG CAPS capsule SMARTSIG:1 Capsule(s) By Mouth Every Evening     No current facility-administered medications on file prior to visit.    ALLERGIES: Allergies  Allergen Reactions   Iodinated Contrast Media Itching    FAMILY HISTORY: No family history on file.  SOCIAL HISTORY: Social History   Socioeconomic History   Marital status: Married    Spouse name: Not on file   Number of children: Not on file   Years of education: Not on file   Highest education level: Not on file  Occupational History   Not on file  Tobacco Use   Smoking status: Former    Types: Cigarettes   Smokeless tobacco: Never  Vaping Use   Vaping Use: Never used  Substance and  Sexual Activity   Alcohol use: Not Currently   Drug use: Never   Sexual activity: Not on file  Other Topics Concern   Not on file  Social History Narrative   Are you right handed or left handed? Right handed    Are you currently employed ? 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 NO   Who lives with you? With family    What type of home do you live in: 1 story or 2 story? One story        Social Determinants of Radio broadcast assistant Strain: Not on file  Food Insecurity: Not on file  Transportation Needs: Not on file  Physical Activity: Not on file  Stress: Not on file  Social Connections: Not on file  Intimate Partner Violence: Not on file     PHYSICAL EXAM: Vitals:   01/04/23 1539  BP: 113/76  Pulse: 77  SpO2: 98%   General: No acute distress Head:  Normocephalic/atraumatic Skin/Extremities: No rash, no edema Neurological Exam: alert and awake. No aphasia or dysarthria. Fund of knowledge is appropriate.   Attention and concentration are normal.   Cranial nerves: Pupils equal, round. Extraocular movements intact with no nystagmus. Visual fields full.  No facial asymmetry. Hard of hearing. Motor: Bulk and  tone normal, muscle strength 5/5 throughout with no pronator drift.   Finger to nose testing intact.  Gait narrow-based and steady, no ataxia. No tremors.   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 Last MRI brain in 10/2022 reported stable dural-based enhancing lesions along the medial aspect of the right middle cranial fossa and along the posterior right temporal convexity, mild interval decrease in size of an intra-axial enhancing lesion in the anterior right temporal lobe with slight interval decrease in extent of edema in the right cerebral hemisphere. EEG showed rare right  frontotemporal epileptiform discharges. His wife denies any further focal seizures (metallic taste, tremors, transient memory loss) since starting low dose oxcarbazepine '150mg'$  BID, low threshold to increase if needed. Refills sent. He is aware of [place] driving laws to stop driving after a seizure until 6 months seizure-free. Continue follow-up with [facility]. Follow-up in 6 months, call for any changes.    Thank you for allowing me to participate in his care.  Please do not hesitate to call for any questions or concerns.    Ellouise Newer, M.D.   CC: Dr. Laurann Montana

## 2023-03-29 ENCOUNTER — Emergency Department (HOSPITAL_COMMUNITY): Payer: PPO

## 2023-03-29 ENCOUNTER — Other Ambulatory Visit: Payer: Self-pay

## 2023-03-29 ENCOUNTER — Emergency Department (HOSPITAL_COMMUNITY)
Admission: EM | Admit: 2023-03-29 | Discharge: 2023-03-29 | Disposition: A | Discharge status: Home / Self Care | Payer: PPO | Source: Home / Self Care | Attending: Emergency Medicine | Admitting: Emergency Medicine

## 2023-03-29 DIAGNOSIS — R339 Retention of urine, unspecified: Secondary | ICD-10-CM | POA: Insufficient documentation

## 2023-03-29 DIAGNOSIS — E871 Hypo-osmolality and hyponatremia: Secondary | ICD-10-CM | POA: Insufficient documentation

## 2023-03-29 DIAGNOSIS — E222 Syndrome of inappropriate secretion of antidiuretic hormone: Secondary | ICD-10-CM | POA: Diagnosis not present

## 2023-03-29 DIAGNOSIS — Y92410 Unspecified street and highway as the place of occurrence of the external cause: Secondary | ICD-10-CM | POA: Insufficient documentation

## 2023-03-29 DIAGNOSIS — G40909 Epilepsy, unspecified, not intractable, without status epilepticus: Secondary | ICD-10-CM | POA: Insufficient documentation

## 2023-03-29 LAB — COMPREHENSIVE METABOLIC PANEL
ALT: 12 U/L (ref 0–44)
AST: 21 U/L (ref 15–41)
Albumin: 3.2 g/dL — ABNORMAL LOW (ref 3.5–5.0)
Alkaline Phosphatase: 49 U/L (ref 38–126)
Anion gap: 12 (ref 5–15)
BUN: 14 mg/dL (ref 8–23)
CO2: 17 mmol/L — ABNORMAL LOW (ref 22–32)
Calcium: 7.9 mg/dL — ABNORMAL LOW (ref 8.9–10.3)
Chloride: 96 mmol/L — ABNORMAL LOW (ref 98–111)
Creatinine, Ser: 1.1 mg/dL (ref 0.61–1.24)
GFR, Estimated: >60 mL/min (ref >60)
Glucose, Bld: 113 mg/dL — ABNORMAL HIGH (ref 70–99)
Potassium: 4 mmol/L (ref 3.5–5.1)
Sodium: 125 mmol/L — ABNORMAL LOW (ref 135–145)
Total Bilirubin: 1.1 mg/dL (ref 0.3–1.2)
Total Protein: 5.2 g/dL — ABNORMAL LOW (ref 6.5–8.1)

## 2023-03-29 LAB — ETHANOL: Alcohol, Ethyl (B): <10 mg/dL (ref <10)

## 2023-03-29 LAB — URINALYSIS, ROUTINE W REFLEX MICROSCOPIC
Bilirubin Urine: NEGATIVE
Glucose, UA: NEGATIVE mg/dL
Hgb urine dipstick: NEGATIVE
Ketones, ur: NEGATIVE mg/dL
Leukocytes,Ua: NEGATIVE
Nitrite: NEGATIVE
Protein, ur: NEGATIVE mg/dL
Specific Gravity, Urine: 1.016 (ref 1.005–1.030)
pH: 5 (ref 5.0–8.0)

## 2023-03-29 LAB — CBC
HCT: 37 % — ABNORMAL LOW (ref 39.0–52.0)
Hemoglobin: 12.6 g/dL — ABNORMAL LOW (ref 13.0–17.0)
MCH: 31 pg (ref 26.0–34.0)
MCHC: 34.1 g/dL (ref 30.0–36.0)
MCV: 91.1 fL (ref 80.0–100.0)
Platelets: 266 10*3/uL (ref 150–400)
RBC: 4.06 MIL/uL — ABNORMAL LOW (ref 4.22–5.81)
RDW: 12 % (ref 11.5–15.5)
WBC: 6.7 10*3/uL (ref 4.0–10.5)
nRBC: 0 % (ref 0.0–0.2)

## 2023-03-29 LAB — I-STAT CHEM 8, ED
BUN: 18 mg/dL (ref 8–23)
Calcium, Ion: 0.98 mmol/L — ABNORMAL LOW (ref 1.15–1.40)
Chloride: 94 mmol/L — ABNORMAL LOW (ref 98–111)
Creatinine, Ser: 1 mg/dL (ref 0.61–1.24)
Glucose, Bld: 109 mg/dL — ABNORMAL HIGH (ref 70–99)
HCT: 37 % — ABNORMAL LOW (ref 39.0–52.0)
Hemoglobin: 12.6 g/dL — ABNORMAL LOW (ref 13.0–17.0)
Potassium: 4.1 mmol/L (ref 3.5–5.1)
Sodium: 124 mmol/L — ABNORMAL LOW (ref 135–145)
TCO2: 22 mmol/L (ref 22–32)

## 2023-03-29 LAB — PROTIME-INR
INR: 1.2 (ref 0.8–1.2)
Prothrombin Time: 15 seconds (ref 11.4–15.2)

## 2023-03-29 LAB — SAMPLE TO BLOOD BANK

## 2023-03-29 LAB — LACTIC ACID, PLASMA: Lactic Acid, Venous: 1.7 mmol/L (ref 0.5–1.9)

## 2023-03-29 NOTE — Discharge Instructions (Addendum)
I recommended you be admitted to the hospital because of your seizure and low sodium levels.  Please return if you change your mind.  Follow-up with your doctors to have your sodium level rechecked and please follow-up with your neurologist because of the seizure you had today.  Do not drive a motor vehicle for at least the next 6 months because of the seizure.    You also had urinary retention so please follow-up with your primary doctor or a urologist in the next week to see if your Foley catheter can be removed.  If you are unable to see them you may come back to the emergency department for evaluation.

## 2023-03-29 NOTE — ED Notes (Signed)
Patient transported to CT 
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
## 2023-03-29 NOTE — ED Provider Notes (Signed)
Patient was set up for discharge but was noted to be retaining urine.  Foley inserted with leg bag.  Family instructed on usage for it.  Urinalysis was sent and shows***.  Will him follow-up with his primary doctor and urologist for removal.

## 2023-03-29 NOTE — ED Notes (Signed)
Bilateral shin abrasions and ecchymosis
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
## 2023-03-29 NOTE — ED Notes (Signed)
Patient verbalizes understanding of discharge instructions. Opportunity for questioning and answers were provided. Armband removed by staff, pt discharged from ED. Pt taken to ED entrance via wheel chair.  
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
## 2023-03-30 ENCOUNTER — Emergency Department (HOSPITAL_COMMUNITY): Payer: PPO

## 2023-03-30 ENCOUNTER — Encounter (HOSPITAL_COMMUNITY): Payer: Self-pay

## 2023-03-30 ENCOUNTER — Inpatient Hospital Stay (HOSPITAL_COMMUNITY)
Admission: EM | Admit: 2023-03-30 | Discharge: 2023-04-03 | DRG: 643 | Disposition: A | Discharge status: Home Health | Payer: PPO | Attending: Internal Medicine | Admitting: Internal Medicine

## 2023-03-30 ENCOUNTER — Other Ambulatory Visit: Payer: Self-pay

## 2023-03-30 DIAGNOSIS — D649 Anemia, unspecified: Secondary | ICD-10-CM | POA: Diagnosis present

## 2023-03-30 DIAGNOSIS — G9341 Metabolic encephalopathy: Secondary | ICD-10-CM | POA: Diagnosis present

## 2023-03-30 DIAGNOSIS — Z515 Encounter for palliative care: Secondary | ICD-10-CM | POA: Diagnosis not present

## 2023-03-30 DIAGNOSIS — G936 Cerebral edema: Secondary | ICD-10-CM | POA: Diagnosis present

## 2023-03-30 DIAGNOSIS — E785 Hyperlipidemia, unspecified: Secondary | ICD-10-CM | POA: Diagnosis present

## 2023-03-30 DIAGNOSIS — N401 Enlarged prostate with lower urinary tract symptoms: Secondary | ICD-10-CM | POA: Diagnosis present

## 2023-03-30 DIAGNOSIS — Z923 Personal history of irradiation: Secondary | ICD-10-CM

## 2023-03-30 DIAGNOSIS — Z6824 Body mass index (BMI) 24.0-24.9, adult: Secondary | ICD-10-CM | POA: Diagnosis not present

## 2023-03-30 DIAGNOSIS — E222 Syndrome of inappropriate secretion of antidiuretic hormone: Principal | ICD-10-CM | POA: Diagnosis present

## 2023-03-30 DIAGNOSIS — E871 Hypo-osmolality and hyponatremia: Principal | ICD-10-CM

## 2023-03-30 DIAGNOSIS — Z87891 Personal history of nicotine dependence: Secondary | ICD-10-CM | POA: Diagnosis not present

## 2023-03-30 DIAGNOSIS — Y92410 Unspecified street and highway as the place of occurrence of the external cause: Secondary | ICD-10-CM

## 2023-03-30 DIAGNOSIS — Z79899 Other long term (current) drug therapy: Secondary | ICD-10-CM | POA: Diagnosis not present

## 2023-03-30 DIAGNOSIS — E43 Unspecified severe protein-calorie malnutrition: Secondary | ICD-10-CM | POA: Diagnosis present

## 2023-03-30 DIAGNOSIS — E861 Hypovolemia: Secondary | ICD-10-CM | POA: Diagnosis present

## 2023-03-30 DIAGNOSIS — D329 Benign neoplasm of meninges, unspecified: Secondary | ICD-10-CM

## 2023-03-30 DIAGNOSIS — C719 Malignant neoplasm of brain, unspecified: Secondary | ICD-10-CM | POA: Diagnosis present

## 2023-03-30 DIAGNOSIS — G40909 Epilepsy, unspecified, not intractable, without status epilepticus: Secondary | ICD-10-CM | POA: Diagnosis present

## 2023-03-30 DIAGNOSIS — Z91041 Radiographic dye allergy status: Secondary | ICD-10-CM

## 2023-03-30 DIAGNOSIS — E039 Hypothyroidism, unspecified: Secondary | ICD-10-CM | POA: Diagnosis present

## 2023-03-30 DIAGNOSIS — Z86718 Personal history of other venous thrombosis and embolism: Secondary | ICD-10-CM

## 2023-03-30 DIAGNOSIS — N390 Urinary tract infection, site not specified: Secondary | ICD-10-CM | POA: Diagnosis present

## 2023-03-30 DIAGNOSIS — R338 Other retention of urine: Secondary | ICD-10-CM | POA: Diagnosis present

## 2023-03-30 DIAGNOSIS — R569 Unspecified convulsions: Secondary | ICD-10-CM | POA: Diagnosis not present

## 2023-03-30 DIAGNOSIS — R319 Hematuria, unspecified: Secondary | ICD-10-CM | POA: Diagnosis present

## 2023-03-30 DIAGNOSIS — Z91148 Patient's other noncompliance with medication regimen for other reason: Secondary | ICD-10-CM | POA: Diagnosis not present

## 2023-03-30 DIAGNOSIS — Z7189 Other specified counseling: Secondary | ICD-10-CM | POA: Diagnosis not present

## 2023-03-30 LAB — CBC WITH DIFFERENTIAL/PLATELET
Abs Immature Granulocytes: 0.03 10*3/uL (ref 0.00–0.07)
Basophils Absolute: 0 10*3/uL (ref 0.0–0.1)
Basophils Relative: 0 %
Eosinophils Absolute: 0.1 10*3/uL (ref 0.0–0.5)
Eosinophils Relative: 1 %
HCT: 38.1 % — ABNORMAL LOW (ref 39.0–52.0)
Hemoglobin: 12.6 g/dL — ABNORMAL LOW (ref 13.0–17.0)
Immature Granulocytes: 0 %
Lymphocytes Relative: 11 %
Lymphs Abs: 0.8 10*3/uL (ref 0.7–4.0)
MCH: 30.5 pg (ref 26.0–34.0)
MCHC: 33.1 g/dL (ref 30.0–36.0)
MCV: 92.3 fL (ref 80.0–100.0)
Monocytes Absolute: 0.7 10*3/uL (ref 0.1–1.0)
Monocytes Relative: 9 %
Neutro Abs: 6.1 10*3/uL (ref 1.7–7.7)
Neutrophils Relative %: 79 %
Platelets: 247 10*3/uL (ref 150–400)
RBC: 4.13 MIL/uL — ABNORMAL LOW (ref 4.22–5.81)
RDW: 11.9 % (ref 11.5–15.5)
WBC: 7.8 10*3/uL (ref 4.0–10.5)
nRBC: 0 % (ref 0.0–0.2)

## 2023-03-30 LAB — RAPID URINE DRUG SCREEN, HOSP PERFORMED
Amphetamines: NOT DETECTED
Barbiturates: NOT DETECTED
Benzodiazepines: NOT DETECTED
Cocaine: NOT DETECTED
Opiates: NOT DETECTED
Tetrahydrocannabinol: NOT DETECTED

## 2023-03-30 LAB — I-STAT CHEM 8, ED
BUN: 14 mg/dL (ref 8–23)
Calcium, Ion: 0.82 mmol/L — CL (ref 1.15–1.40)
Chloride: 95 mmol/L — ABNORMAL LOW (ref 98–111)
Creatinine, Ser: 1 mg/dL (ref 0.61–1.24)
Glucose, Bld: 85 mg/dL (ref 70–99)
HCT: 37 % — ABNORMAL LOW (ref 39.0–52.0)
Hemoglobin: 12.6 g/dL — ABNORMAL LOW (ref 13.0–17.0)
Potassium: 4.7 mmol/L (ref 3.5–5.1)
Sodium: 121 mmol/L — ABNORMAL LOW (ref 135–145)
TCO2: 22 mmol/L (ref 22–32)

## 2023-03-30 LAB — COMPREHENSIVE METABOLIC PANEL
ALT: 10 U/L (ref 0–44)
AST: 20 U/L (ref 15–41)
Albumin: 3.2 g/dL — ABNORMAL LOW (ref 3.5–5.0)
Alkaline Phosphatase: 56 U/L (ref 38–126)
Anion gap: 8 (ref 5–15)
BUN: 10 mg/dL (ref 8–23)
CO2: 20 mmol/L — ABNORMAL LOW (ref 22–32)
Calcium: 8.1 mg/dL — ABNORMAL LOW (ref 8.9–10.3)
Chloride: 95 mmol/L — ABNORMAL LOW (ref 98–111)
Creatinine, Ser: 1.08 mg/dL (ref 0.61–1.24)
GFR, Estimated: >60 mL/min (ref >60)
Glucose, Bld: 84 mg/dL (ref 70–99)
Potassium: 4.8 mmol/L (ref 3.5–5.1)
Sodium: 123 mmol/L — ABNORMAL LOW (ref 135–145)
Total Bilirubin: 0.8 mg/dL (ref 0.3–1.2)
Total Protein: 5.3 g/dL — ABNORMAL LOW (ref 6.5–8.1)

## 2023-03-30 LAB — AMMONIA: Ammonia: 27 umol/L (ref 9–35)

## 2023-03-30 LAB — OSMOLALITY: Osmolality: 270 mOsm/kg — ABNORMAL LOW (ref 275–295)

## 2023-03-30 LAB — URINALYSIS, MICROSCOPIC (REFLEX): RBC / HPF: >50 RBC/hpf (ref 0–5)

## 2023-03-30 LAB — MRSA NEXT GEN BY PCR, NASAL: MRSA by PCR Next Gen: NOT DETECTED

## 2023-03-30 LAB — URINALYSIS, ROUTINE W REFLEX MICROSCOPIC
Glucose, UA: NEGATIVE mg/dL
Ketones, ur: NEGATIVE mg/dL
Nitrite: POSITIVE — AB
Protein, ur: 100 mg/dL — AB
Specific Gravity, Urine: 1.02 (ref 1.005–1.030)
pH: 7 (ref 5.0–8.0)

## 2023-03-30 LAB — SODIUM: Sodium: 124 mmol/L — ABNORMAL LOW (ref 135–145)

## 2023-03-30 LAB — PROTIME-INR
INR: 1.2 (ref 0.8–1.2)
Prothrombin Time: 15 seconds (ref 11.4–15.2)

## 2023-03-30 LAB — NA AND K (SODIUM & POTASSIUM), RAND UR
Potassium Urine: 36 mmol/L
Sodium, Ur: 159 mmol/L

## 2023-03-30 LAB — OSMOLALITY, URINE: Osmolality, Ur: 534 mOsm/kg (ref 300–900)

## 2023-03-30 LAB — TSH: TSH: 4.924 u[IU]/mL — ABNORMAL HIGH (ref 0.350–4.500)

## 2023-03-30 MED ORDER — ZIPRASIDONE MESYLATE 20 MG IM SOLR
20.0000 mg | Freq: Once | INTRAMUSCULAR | Status: AC
  Administered 2023-03-30: 20 mg via INTRAMUSCULAR
  Filled 2023-03-30: qty 20

## 2023-03-30 MED ORDER — CHLORHEXIDINE GLUCONATE CLOTH 2 % EX PADS
6.0000 | MEDICATED_PAD | Freq: Every day | CUTANEOUS | Status: DC
  Administered 2023-03-31 – 2023-04-03 (×4): 6 via TOPICAL

## 2023-03-30 MED ORDER — HALOPERIDOL LACTATE 5 MG/ML IJ SOLN: 5.0000 mg | Freq: Four times a day (QID) | INTRAMUSCULAR | Status: DC | PRN

## 2023-03-30 MED ORDER — STERILE WATER FOR INJECTION IJ SOLN
INTRAMUSCULAR | Status: AC
  Filled 2023-03-30: qty 10

## 2023-03-30 MED ORDER — SODIUM CHLORIDE 0.9 % IV SOLN
2000.0000 mg | Freq: Once | INTRAVENOUS | Status: AC
  Administered 2023-03-31: 2000 mg via INTRAVENOUS
  Filled 2023-03-30: qty 20

## 2023-03-30 MED ORDER — DEXAMETHASONE SODIUM PHOSPHATE 10 MG/ML IJ SOLN
10.0000 mg | Freq: Once | INTRAMUSCULAR | Status: AC
  Administered 2023-03-30: 10 mg via INTRAVENOUS
  Filled 2023-03-30: qty 1

## 2023-03-30 MED ORDER — OXCARBAZEPINE 150 MG PO TABS
150.0000 mg | ORAL_TABLET | Freq: Two times a day (BID) | ORAL | Status: DC
  Filled 2023-03-30: qty 1

## 2023-03-30 MED ORDER — SODIUM CHLORIDE 0.9 % IV SOLN
2.0000 g | Freq: Every day | INTRAVENOUS | Status: AC
  Administered 2023-03-30 – 2023-04-01 (×3): 2 g via INTRAVENOUS
  Filled 2023-03-30 (×3): qty 20

## 2023-03-30 MED ORDER — SODIUM CHLORIDE 0.9 % IV SOLN: INTRAVENOUS | Status: DC

## 2023-03-30 MED ORDER — SODIUM CHLORIDE 0.9 % IV BOLUS
500.0000 mL | Freq: Once | INTRAVENOUS | Status: AC
  Administered 2023-03-30: 500 mL via INTRAVENOUS

## 2023-03-30 MED ORDER — LORAZEPAM 2 MG/ML IJ SOLN
2.0000 mg | INTRAMUSCULAR | Status: DC | PRN
  Filled 2023-03-30: qty 1

## 2023-03-30 MED ORDER — DOCUSATE SODIUM 100 MG PO CAPS: 100.0000 mg | ORAL_CAPSULE | Freq: Two times a day (BID) | ORAL | Status: DC | PRN

## 2023-03-30 MED ORDER — POLYETHYLENE GLYCOL 3350 17 G PO PACK: 17.0000 g | PACK | Freq: Every day | ORAL | Status: DC | PRN

## 2023-03-30 MED ORDER — LEVETIRACETAM IN NACL 500 MG/100ML IV SOLN
500.0000 mg | Freq: Two times a day (BID) | INTRAVENOUS | Status: DC
  Administered 2023-03-31 – 2023-04-01 (×3): 500 mg via INTRAVENOUS
  Filled 2023-03-30 (×3): qty 100

## 2023-03-30 MED ORDER — DEXAMETHASONE SODIUM PHOSPHATE 4 MG/ML IJ SOLN
4.0000 mg | Freq: Four times a day (QID) | INTRAMUSCULAR | Status: DC
  Administered 2023-03-31 – 2023-04-03 (×13): 4 mg via INTRAVENOUS
  Filled 2023-03-30 (×13): qty 1

## 2023-03-30 MED ORDER — HEPARIN SODIUM (PORCINE) 5000 UNIT/ML IJ SOLN
5000.0000 [IU] | Freq: Three times a day (TID) | INTRAMUSCULAR | Status: DC
  Administered 2023-03-30 – 2023-04-03 (×10): 5000 [IU] via SUBCUTANEOUS
  Filled 2023-03-30 (×11): qty 1

## 2023-03-30 NOTE — ED Notes (Addendum)
Family came to pt's bedside and stated pt keeps pulling on foley catheter and states he wants it out while at home

## 2023-03-30 NOTE — Progress Notes (Signed)
Visited with patient and provided emotional and spiritual support and gave to wife snack per her request. Snack was for wife not patient.  Chaplain available as needed.       Venida Jarvis, [place], [facility], Pager [phone]

## 2023-03-30 NOTE — ED Triage Notes (Signed)
Pt arrived via [facility] from home for hematuria and increased confusion per family. Per EMS, pt had hematuria 500 mL at home. Per EMS, initial bp 90/60 laying down. EMS gave NS 100 mL. Pt is A&Ox4. Pt currently has blood in foley bag approx 50 mL. Pt denies abd pain or pain in penis.
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
## 2023-03-30 NOTE — ED Notes (Signed)
Pt ripped IV out and ripped everything off. I went into room and pt was walking around and stated he wanted to go home. I got pt back into bed.

## 2023-03-30 NOTE — Progress Notes (Signed)
eLink Physician-Brief Progress Note Patient Name: Aaron Hickman DOB: 09-25-1936 MRN: [id number]   Date of Service  03/30/2023  HPI/Events of Note  87 year old male with a history of a brain tumor admitted through the ED for altered mental status believed to be related to a combination of hyponatremia and his brain masses.  eICU Interventions  Patient admitted.  Doing well.  Continue current critical care interventions.  No indication for hypertonic saline infusion.     Intervention Category Evaluation Type: New Patient Evaluation  Carilyn Goodpasture 03/30/2023, 10:30 PM
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
## 2023-03-30 NOTE — ED Notes (Signed)
Patient transported to CT 

## 2023-03-31 DIAGNOSIS — E871 Hypo-osmolality and hyponatremia: Secondary | ICD-10-CM | POA: Diagnosis not present

## 2023-03-31 DIAGNOSIS — R569 Unspecified convulsions: Secondary | ICD-10-CM | POA: Diagnosis not present

## 2023-03-31 DIAGNOSIS — C719 Malignant neoplasm of brain, unspecified: Secondary | ICD-10-CM | POA: Diagnosis not present

## 2023-03-31 LAB — PHOSPHORUS: Phosphorus: 3 mg/dL (ref 2.5–4.6)

## 2023-03-31 LAB — BASIC METABOLIC PANEL
Anion gap: 9 (ref 5–15)
BUN: 10 mg/dL (ref 8–23)
CO2: 18 mmol/L — ABNORMAL LOW (ref 22–32)
Calcium: 7.7 mg/dL — ABNORMAL LOW (ref 8.9–10.3)
Chloride: 96 mmol/L — ABNORMAL LOW (ref 98–111)
Creatinine, Ser: 0.87 mg/dL (ref 0.61–1.24)
GFR, Estimated: >60 mL/min (ref >60)
Glucose, Bld: 104 mg/dL — ABNORMAL HIGH (ref 70–99)
Potassium: 4.3 mmol/L (ref 3.5–5.1)
Sodium: 123 mmol/L — ABNORMAL LOW (ref 135–145)

## 2023-03-31 LAB — CBC
HCT: 35.7 % — ABNORMAL LOW (ref 39.0–52.0)
Hemoglobin: 12.4 g/dL — ABNORMAL LOW (ref 13.0–17.0)
MCH: 30.8 pg (ref 26.0–34.0)
MCHC: 34.7 g/dL (ref 30.0–36.0)
MCV: 88.6 fL (ref 80.0–100.0)
Platelets: 224 10*3/uL (ref 150–400)
RBC: 4.03 MIL/uL — ABNORMAL LOW (ref 4.22–5.81)
RDW: 11.8 % (ref 11.5–15.5)
WBC: 6.1 10*3/uL (ref 4.0–10.5)
nRBC: 0 % (ref 0.0–0.2)

## 2023-03-31 LAB — MAGNESIUM: Magnesium: 1.6 mg/dL — ABNORMAL LOW (ref 1.7–2.4)

## 2023-03-31 LAB — URINE CULTURE: Culture: NO GROWTH

## 2023-03-31 LAB — UREA NITROGEN, URINE: Urea Nitrogen, Ur: 383 mg/dL

## 2023-03-31 LAB — SODIUM
Sodium: 127 mmol/L — ABNORMAL LOW (ref 135–145)
Sodium: 135 mmol/L (ref 135–145)
Sodium: 133 mmol/L — ABNORMAL LOW (ref 135–145)

## 2023-03-31 LAB — CORTISOL
Cortisol, Plasma: 8.9 ug/dL
Cortisol, Plasma: 3.9 ug/dL

## 2023-03-31 LAB — T4, FREE: Free T4: 0.43 ng/dL — ABNORMAL LOW (ref 0.61–1.12)

## 2023-03-31 LAB — GLUCOSE, CAPILLARY
Glucose-Capillary: 203 mg/dL — ABNORMAL HIGH (ref 70–99)
Glucose-Capillary: 105 mg/dL — ABNORMAL HIGH (ref 70–99)

## 2023-03-31 MED ORDER — LORAZEPAM 2 MG/ML IJ SOLN: 2.0000 mg | Freq: Once | INTRAMUSCULAR | Status: AC

## 2023-03-31 MED ORDER — COSYNTROPIN 0.25 MG IJ SOLR
0.2500 mg | Freq: Once | INTRAMUSCULAR | Status: AC
  Administered 2023-04-01: 0.25 mg via INTRAVENOUS
  Filled 2023-03-31 (×2): qty 0.25

## 2023-03-31 MED ORDER — MAGNESIUM SULFATE 4 GM/100ML IV SOLN
4.0000 g | Freq: Once | INTRAVENOUS | Status: AC
  Administered 2023-03-31: 4 g via INTRAVENOUS
  Filled 2023-03-31: qty 100

## 2023-03-31 MED ORDER — SODIUM CHLORIDE 0.9 % IV BOLUS
1000.0000 mL | Freq: Once | INTRAVENOUS | Status: AC
  Administered 2023-03-31: 1000 mL via INTRAVENOUS

## 2023-03-31 MED ORDER — LORAZEPAM 2 MG/ML IJ SOLN
INTRAMUSCULAR | Status: AC
  Administered 2023-03-31: 2 mg via INTRAVENOUS
  Filled 2023-03-31: qty 1

## 2023-03-31 MED ORDER — INSULIN ASPART 100 UNIT/ML IJ SOLN
0.0000 [IU] | Freq: Three times a day (TID) | INTRAMUSCULAR | Status: DC
  Administered 2023-03-31: 5 [IU] via SUBCUTANEOUS
  Administered 2023-04-01: 2 [IU] via SUBCUTANEOUS
  Administered 2023-04-01: 3 [IU] via SUBCUTANEOUS
  Administered 2023-04-01 – 2023-04-02 (×2): 2 [IU] via SUBCUTANEOUS
  Administered 2023-04-02 (×2): 3 [IU] via SUBCUTANEOUS
  Administered 2023-04-03: 2 [IU] via SUBCUTANEOUS

## 2023-03-31 NOTE — Progress Notes (Signed)
[facility] ADULT ICU REPLACEMENT PROTOCOL   The patient does apply for the [facility] Adult ICU Electrolyte Replacment Protocol based on the criteria listed below:   1.Exclusion criteria: TCTS, ECMO, Dialysis, and Myasthenia Gravis patients 2. Is GFR >/= 30 ml/min? Yes.    Patient's GFR today is >60 3. Is SCr </= 2? Yes.   Patient's SCr is 0.87 mg/dL 4. Did SCr increase >/= 0.5 in 24 hours? No. 5.Pt's weight >40kg  Yes.   6. Abnormal electrolyte(s): Mag 1.6  7. Electrolytes replaced per protocol 8.  Call MD STAT for K+ </= 2.5, Phos </= 1, or Mag </= 1 Physician:    Markus Daft A 03/31/2023 6:18 AM
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
## 2023-04-01 DIAGNOSIS — R569 Unspecified convulsions: Secondary | ICD-10-CM | POA: Diagnosis not present

## 2023-04-01 DIAGNOSIS — C719 Malignant neoplasm of brain, unspecified: Secondary | ICD-10-CM

## 2023-04-01 DIAGNOSIS — D329 Benign neoplasm of meninges, unspecified: Secondary | ICD-10-CM

## 2023-04-01 DIAGNOSIS — Z7189 Other specified counseling: Secondary | ICD-10-CM | POA: Diagnosis not present

## 2023-04-01 DIAGNOSIS — E871 Hypo-osmolality and hyponatremia: Secondary | ICD-10-CM | POA: Diagnosis not present

## 2023-04-01 DIAGNOSIS — Z515 Encounter for palliative care: Secondary | ICD-10-CM

## 2023-04-01 LAB — ACTH STIMULATION, 3 TIME POINTS
Cortisol, 30 Min: 9.5 ug/dL
Cortisol, 60 Min: 12.4 ug/dL
Cortisol, Base: 0.8 ug/dL

## 2023-04-01 LAB — BASIC METABOLIC PANEL
Anion gap: 11 (ref 5–15)
BUN: 14 mg/dL (ref 8–23)
CO2: 19 mmol/L — ABNORMAL LOW (ref 22–32)
Calcium: 8 mg/dL — ABNORMAL LOW (ref 8.9–10.3)
Chloride: 101 mmol/L (ref 98–111)
Creatinine, Ser: 1.03 mg/dL (ref 0.61–1.24)
GFR, Estimated: >60 mL/min (ref >60)
Glucose, Bld: 137 mg/dL — ABNORMAL HIGH (ref 70–99)
Potassium: 4.3 mmol/L (ref 3.5–5.1)
Sodium: 131 mmol/L — ABNORMAL LOW (ref 135–145)

## 2023-04-01 LAB — MAGNESIUM: Magnesium: 2.6 mg/dL — ABNORMAL HIGH (ref 1.7–2.4)

## 2023-04-01 LAB — CORTISOL: Cortisol, Plasma: 0.8 ug/dL

## 2023-04-01 LAB — GLUCOSE, CAPILLARY
Glucose-Capillary: 138 mg/dL — ABNORMAL HIGH (ref 70–99)
Glucose-Capillary: 134 mg/dL — ABNORMAL HIGH (ref 70–99)
Glucose-Capillary: 172 mg/dL — ABNORMAL HIGH (ref 70–99)
Glucose-Capillary: 143 mg/dL — ABNORMAL HIGH (ref 70–99)

## 2023-04-01 MED ORDER — LEVOTHYROXINE SODIUM 50 MCG PO TABS
50.0000 ug | ORAL_TABLET | Freq: Every day | ORAL | Status: DC
  Administered 2023-04-01 – 2023-04-03 (×3): 50 ug via ORAL
  Filled 2023-04-01 (×3): qty 1

## 2023-04-01 MED ORDER — LEVETIRACETAM 500 MG PO TABS
500.0000 mg | ORAL_TABLET | Freq: Two times a day (BID) | ORAL | Status: DC
  Administered 2023-04-01 – 2023-04-03 (×5): 500 mg via ORAL
  Filled 2023-04-01 (×5): qty 1
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
## 2023-04-01 NOTE — Progress Notes (Signed)
Inpatient Rehab Admissions Coordinator:   Per PT request pt was screened for CIR candidacy by Estill Dooms, PT, DPT.  At this time we are recommending a CIR consult and I will place an order for assessment per our protocol.   Estill Dooms, PT, DPT Admissions Coordinator [phone] 04/01/23  12:35 PM
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
## 2023-04-02 DIAGNOSIS — E871 Hypo-osmolality and hyponatremia: Secondary | ICD-10-CM | POA: Diagnosis not present

## 2023-04-02 DIAGNOSIS — G40909 Epilepsy, unspecified, not intractable, without status epilepticus: Secondary | ICD-10-CM | POA: Diagnosis not present

## 2023-04-02 DIAGNOSIS — R569 Unspecified convulsions: Secondary | ICD-10-CM | POA: Diagnosis not present

## 2023-04-02 DIAGNOSIS — G9341 Metabolic encephalopathy: Secondary | ICD-10-CM

## 2023-04-02 DIAGNOSIS — D329 Benign neoplasm of meninges, unspecified: Secondary | ICD-10-CM | POA: Diagnosis not present

## 2023-04-02 DIAGNOSIS — E43 Unspecified severe protein-calorie malnutrition: Secondary | ICD-10-CM | POA: Insufficient documentation

## 2023-04-02 LAB — BASIC METABOLIC PANEL
Anion gap: 8 (ref 5–15)
BUN: 24 mg/dL — ABNORMAL HIGH (ref 8–23)
CO2: 21 mmol/L — ABNORMAL LOW (ref 22–32)
Calcium: 7.8 mg/dL — ABNORMAL LOW (ref 8.9–10.3)
Chloride: 103 mmol/L (ref 98–111)
Creatinine, Ser: 1.02 mg/dL (ref 0.61–1.24)
GFR, Estimated: >60 mL/min (ref >60)
Glucose, Bld: 140 mg/dL — ABNORMAL HIGH (ref 70–99)
Potassium: 4.1 mmol/L (ref 3.5–5.1)
Sodium: 132 mmol/L — ABNORMAL LOW (ref 135–145)

## 2023-04-02 LAB — GLUCOSE, CAPILLARY
Glucose-Capillary: 125 mg/dL — ABNORMAL HIGH (ref 70–99)
Glucose-Capillary: 180 mg/dL — ABNORMAL HIGH (ref 70–99)
Glucose-Capillary: 118 mg/dL — ABNORMAL HIGH (ref 70–99)
Glucose-Capillary: 176 mg/dL — ABNORMAL HIGH (ref 70–99)
Glucose-Capillary: 190 mg/dL — ABNORMAL HIGH (ref 70–99)

## 2023-04-02 MED ORDER — ADULT MULTIVITAMIN W/MINERALS CH
1.0000 | ORAL_TABLET | Freq: Every day | ORAL | Status: DC
  Administered 2023-04-03: 1 via ORAL
  Filled 2023-04-02: qty 1

## 2023-04-02 MED ORDER — ENSURE ENLIVE PO LIQD
237.0000 mL | Freq: Two times a day (BID) | ORAL | Status: DC
  Administered 2023-04-02 – 2023-04-03 (×3): 237 mL via ORAL
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
## 2023-04-02 NOTE — Progress Notes (Signed)
Inpatient Rehab Coordinator Note:  I met with patient at bedside to discuss CIR recommendations and goals/expectations of CIR stay.  We reviewed 3 hrs/day of therapy, physician follow up, and average length of stay 2 weeks (dependent upon progress) with goals of supervision.  He is not interested in staying inpatient for rehab; much prefers to d/c home where he says his spouse can provide support.  I did leave a message for his spouse to discuss caregiver support.   Estill Dooms, PT, DPT Admissions Coordinator [phone] 04/02/23  4:19 PM
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
## 2023-04-02 NOTE — Care Management Important Message (Signed)
Important Message  Patient Details  Name: Aaron Hickman MRN: [id number] Date of Birth: May 28, 1936   Medicare Important Message Given:  Yes     Dorena Bodo 04/02/2023, 2:37 PM

## 2023-04-03 DIAGNOSIS — E43 Unspecified severe protein-calorie malnutrition: Secondary | ICD-10-CM | POA: Diagnosis not present

## 2023-04-03 DIAGNOSIS — C719 Malignant neoplasm of brain, unspecified: Secondary | ICD-10-CM | POA: Diagnosis not present

## 2023-04-03 DIAGNOSIS — E871 Hypo-osmolality and hyponatremia: Secondary | ICD-10-CM | POA: Diagnosis not present

## 2023-04-03 DIAGNOSIS — D329 Benign neoplasm of meninges, unspecified: Secondary | ICD-10-CM | POA: Diagnosis not present

## 2023-04-03 LAB — GLUCOSE, CAPILLARY: Glucose-Capillary: 140 mg/dL — ABNORMAL HIGH (ref 70–99)

## 2023-04-03 MED ORDER — DEXAMETHASONE 4 MG PO TABS: ORAL_TABLET | ORAL | 0 refills | Status: DC

## 2023-04-03 MED ORDER — LEVETIRACETAM 500 MG PO TABS: 500.0000 mg | ORAL_TABLET | Freq: Two times a day (BID) | ORAL | 2 refills | Status: DC

## 2023-04-03 MED ORDER — DEXAMETHASONE 4 MG PO TABS: 4.0000 mg | ORAL_TABLET | Freq: Two times a day (BID) | ORAL | Status: DC

## 2023-04-03 MED ORDER — DOCUSATE SODIUM 100 MG PO CAPS: 100.0000 mg | ORAL_CAPSULE | Freq: Two times a day (BID) | ORAL | 0 refills | Status: DC | PRN

## 2023-04-03 MED ORDER — ADULT MULTIVITAMIN W/MINERALS CH: 1.0000 | ORAL_TABLET | Freq: Every day | ORAL | Status: AC

## 2023-04-03 MED ORDER — LEVOTHYROXINE SODIUM 50 MCG PO TABS: 50.0000 ug | ORAL_TABLET | Freq: Every day | ORAL | 0 refills | Status: AC

## 2023-04-03 MED ORDER — POLYETHYLENE GLYCOL 3350 17 G PO PACK: 17.0000 g | PACK | Freq: Every day | ORAL | 0 refills | Status: DC | PRN

## 2023-04-03 MED ORDER — ENSURE ENLIVE PO LIQD: 237.0000 mL | Freq: Two times a day (BID) | ORAL | 2 refills | Status: DC

## 2023-04-03 NOTE — Care Management (Signed)
Spoke w patient and family at bedside. RW to be delivered to the room through [facility] before he goes.  I have set up home health services through [place], family had no preference for provider.

## 2023-04-03 NOTE — Plan of Care (Signed)
Patient is resting in bed with normal vitals and respirations. Patient has been told by MD of discharge preparations and has declined all meds. IV has been removed per patient request. Wife is at bedside. Plan of care is ongoing.  Problem: Education: Goal: Ability to describe self-care measures that may prevent or decrease complications (Diabetes Survival Skills Education) will improve Outcome: Progressing Goal: Individualized Educational Video(s) Outcome: Progressing   Problem: Coping: Goal: Ability to adjust to condition or change in health will improve Outcome: Progressing   Problem: Fluid Volume: Goal: Ability to maintain a balanced intake and output will improve Outcome: Progressing   Problem: Health Behavior/Discharge Planning: Goal: Ability to identify and utilize available resources and services will improve Outcome: Progressing Goal: Ability to manage health-related needs will improve Outcome: Progressing   Problem: Metabolic: Goal: Ability to maintain appropriate glucose levels will improve Outcome: Progressing   Problem: Nutritional: Goal: Maintenance of adequate nutrition will improve Outcome: Progressing Goal: Progress toward achieving an optimal weight will improve Outcome: Progressing   Problem: Skin Integrity: Goal: Risk for impaired skin integrity will decrease Outcome: Progressing   Problem: Tissue Perfusion: Goal: Adequacy of tissue perfusion will improve Outcome: Progressing   Problem: Education: Goal: Knowledge of General Education information will improve Description: Including pain rating scale, medication(s)/side effects and non-pharmacologic comfort measures Outcome: Progressing   Problem: Health Behavior/Discharge Planning: Goal: Ability to manage health-related needs will improve Outcome: Progressing   Problem: Clinical Measurements: Goal: Ability to maintain clinical measurements within normal limits will improve Outcome:  Progressing Goal: Will remain free from infection Outcome: Progressing Goal: Diagnostic test results will improve Outcome: Progressing Goal: Respiratory complications will improve Outcome: Progressing Goal: Cardiovascular complication will be avoided Outcome: Progressing   Problem: Activity: Goal: Risk for activity intolerance will decrease Outcome: Progressing   Problem: Nutrition: Goal: Adequate nutrition will be maintained Outcome: Progressing   Problem: Coping: Goal: Level of anxiety will decrease Outcome: Progressing   Problem: Elimination: Goal: Will not experience complications related to bowel motility Outcome: Progressing Goal: Will not experience complications related to urinary retention Outcome: Progressing   Problem: Pain Managment: Goal: General experience of comfort will improve Outcome: Progressing   Problem: Safety: Goal: Ability to remain free from injury will improve Outcome: Progressing   Problem: Skin Integrity: Goal: Risk for impaired skin integrity will decrease Outcome: Progressing
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
## 2023-04-16 ENCOUNTER — Encounter: Payer: Self-pay | Admitting: Neurology

## 2023-04-16 ENCOUNTER — Ambulatory Visit (INDEPENDENT_AMBULATORY_CARE_PROVIDER_SITE_OTHER): Payer: PPO | Admitting: Neurology

## 2023-04-16 VITALS — BP 107/58 | HR 64 | Ht 70.0 in | Wt 160.2 lb

## 2023-04-16 DIAGNOSIS — G40209 Localization-related (focal) (partial) symptomatic epilepsy and epileptic syndromes with complex partial seizures, not intractable, without status epilepticus: Secondary | ICD-10-CM | POA: Diagnosis not present

## 2023-04-16 DIAGNOSIS — G9389 Other specified disorders of brain: Secondary | ICD-10-CM | POA: Diagnosis not present
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
## 2023-05-17 ENCOUNTER — Emergency Department (HOSPITAL_COMMUNITY): Payer: PPO

## 2023-05-17 ENCOUNTER — Encounter (HOSPITAL_COMMUNITY): Payer: Self-pay

## 2023-05-17 ENCOUNTER — Other Ambulatory Visit: Payer: Self-pay

## 2023-05-17 ENCOUNTER — Emergency Department (HOSPITAL_COMMUNITY)
Admission: EM | Admit: 2023-05-17 | Discharge: 2023-05-17 | Disposition: A | Discharge status: Home / Self Care | Payer: PPO | Attending: Emergency Medicine | Admitting: Emergency Medicine

## 2023-05-17 DIAGNOSIS — R55 Syncope and collapse: Secondary | ICD-10-CM | POA: Insufficient documentation

## 2023-05-17 DIAGNOSIS — G40909 Epilepsy, unspecified, not intractable, without status epilepticus: Secondary | ICD-10-CM | POA: Insufficient documentation

## 2023-05-17 DIAGNOSIS — N390 Urinary tract infection, site not specified: Secondary | ICD-10-CM

## 2023-05-17 DIAGNOSIS — R410 Disorientation, unspecified: Secondary | ICD-10-CM | POA: Diagnosis not present

## 2023-05-17 DIAGNOSIS — R42 Dizziness and giddiness: Secondary | ICD-10-CM | POA: Insufficient documentation

## 2023-05-17 DIAGNOSIS — R339 Retention of urine, unspecified: Secondary | ICD-10-CM

## 2023-05-17 LAB — BASIC METABOLIC PANEL
Anion gap: 9 (ref 5–15)
BUN: 15 mg/dL (ref 8–23)
CO2: 22 mmol/L (ref 22–32)
Calcium: 8.2 mg/dL — ABNORMAL LOW (ref 8.9–10.3)
Chloride: 102 mmol/L (ref 98–111)
Creatinine, Ser: 1.1 mg/dL (ref 0.61–1.24)
GFR, Estimated: >60 mL/min (ref >60)
Glucose, Bld: 118 mg/dL — ABNORMAL HIGH (ref 70–99)
Potassium: 4.1 mmol/L (ref 3.5–5.1)
Sodium: 133 mmol/L — ABNORMAL LOW (ref 135–145)

## 2023-05-17 LAB — URINALYSIS, MICROSCOPIC (REFLEX)
RBC / HPF: >50 RBC/hpf (ref 0–5)
WBC, UA: >50 WBC/hpf (ref 0–5)

## 2023-05-17 LAB — CBC
HCT: 36.6 % — ABNORMAL LOW (ref 39.0–52.0)
Hemoglobin: 11.7 g/dL — ABNORMAL LOW (ref 13.0–17.0)
MCH: 31.1 pg (ref 26.0–34.0)
MCHC: 32 g/dL (ref 30.0–36.0)
MCV: 97.3 fL (ref 80.0–100.0)
Platelets: 435 10*3/uL — ABNORMAL HIGH (ref 150–400)
RBC: 3.76 MIL/uL — ABNORMAL LOW (ref 4.22–5.81)
RDW: 12 % (ref 11.5–15.5)
WBC: 10.2 10*3/uL (ref 4.0–10.5)
nRBC: 0 % (ref 0.0–0.2)

## 2023-05-17 LAB — URINALYSIS, ROUTINE W REFLEX MICROSCOPIC
Bilirubin Urine: NEGATIVE
Glucose, UA: NEGATIVE mg/dL
Ketones, ur: NEGATIVE mg/dL
Nitrite: POSITIVE — AB
Protein, ur: 100 mg/dL — AB
Specific Gravity, Urine: 1.025 (ref 1.005–1.030)
pH: 6 (ref 5.0–8.0)

## 2023-05-17 LAB — HEPATIC FUNCTION PANEL
ALT: 10 U/L (ref 0–44)
AST: 18 U/L (ref 15–41)
Albumin: 2.8 g/dL — ABNORMAL LOW (ref 3.5–5.0)
Alkaline Phosphatase: 59 U/L (ref 38–126)
Bilirubin, Direct: 0.1 mg/dL (ref 0.0–0.2)
Indirect Bilirubin: 0.2 mg/dL — ABNORMAL LOW (ref 0.3–0.9)
Total Bilirubin: 0.3 mg/dL (ref 0.3–1.2)
Total Protein: 5.7 g/dL — ABNORMAL LOW (ref 6.5–8.1)

## 2023-05-17 LAB — CBG MONITORING, ED: Glucose-Capillary: 94 mg/dL (ref 70–99)

## 2023-05-17 LAB — TROPONIN I (HIGH SENSITIVITY)
Troponin I (High Sensitivity): 6 ng/L (ref <18)
Troponin I (High Sensitivity): 7 ng/L (ref <18)

## 2023-05-17 LAB — MAGNESIUM: Magnesium: 1.7 mg/dL (ref 1.7–2.4)

## 2023-05-17 MED ORDER — SODIUM CHLORIDE 0.9 % IV SOLN
1.0000 g | Freq: Once | INTRAVENOUS | Status: AC
  Administered 2023-05-17: 1 g via INTRAVENOUS
  Filled 2023-05-17: qty 10

## 2023-05-17 MED ORDER — LEVETIRACETAM IN NACL 500 MG/100ML IV SOLN
500.0000 mg | Freq: Once | INTRAVENOUS | Status: AC
  Administered 2023-05-17: 500 mg via INTRAVENOUS
  Filled 2023-05-17: qty 100

## 2023-05-17 MED ORDER — LACTATED RINGERS IV BOLUS
500.0000 mL | Freq: Once | INTRAVENOUS | Status: AC
  Administered 2023-05-17: 500 mL via INTRAVENOUS

## 2023-05-17 MED ORDER — CEFDINIR 300 MG PO CAPS: 300.0000 mg | ORAL_CAPSULE | Freq: Two times a day (BID) | ORAL | 0 refills | Status: AC

## 2023-05-17 NOTE — ED Notes (Signed)
Patient transported to CT 

## 2023-05-17 NOTE — ED Triage Notes (Signed)
Pt arrived via [facility] from urology. Pt had a procedure done on his bladder where they expanded his bladder. After they were done with the procedure, pt had a syncopal/seizure. Pt did not fall or hit head. Staff assisted pt to lay on floor. Per EMS< pt initial bp 82/48. Ems gave NS 200 mL. Per EMS pt is A&Ox1 to self only and keeps repeating the same questions. Pt is A&Ox3 to self, place and time.

## 2023-05-17 NOTE — ED Provider Notes (Signed)
[place] EMERGENCY DEPARTMENT AT [facility] Provider Note   CSN: [id number] Arrival date & time: 05/17/23  1240     History  Chief Complaint  Patient presents with   Seizures    Aaron Hickman is a 87 y.o. male.   Seizures Patient presents for loss of consciousness.  Medical history includes seizures, meningioma, malnutrition.  He was admitted 2 months ago for hyponatremia.  Following that, his AED was switched from oxcarbazepine to Keppra.  He was undergoing a urology procedure today when he had a witnessed LOC.  He did not fall at the time.  EMS noted initial blood pressures of 82/48.  He received 200 cc IVF prior to arrival.  Patient describes urology procedure as a removal of a Foley catheter, although he calls it a "stent".  He states that he was only able to void a few drops afterwards.  He describes a prodrome of lightheadedness and feeling as if he might pass out.  He states that his symptoms were not similar to prior seizure episodes.  History per wife: Patient went to the urology office today for removal of the Foley catheter that had been in for the past 40 days.  Shortly after removal, he stated that he felt like he was going to pass out.  He was lowered to the floor.  He was unconscious for less than 1 minute.  She did not see any convulsive activity.  Episode was not consistent with prior seizures.  When he came to, he was mildly confused.  He is at his mental baseline currently.     Home Medications Prior to Admission medications   Medication Sig Start Date End Date Taking? Authorizing Provider  Cholecalciferol (D3) 50 MCG (2000 UT) TABS Take 1 tablet by mouth daily.    [provider]  dexamethasone (DECADRON) 4 MG tablet Take dexamethasone 4 mg twice daily for 7 days followed by  Dexamethasone 4  mg daily for 7 days. 04/03/23   Kathlen Mody, MD  docusate sodium (COLACE) 100 MG capsule Take 1 capsule (100 mg total) by mouth 2 (two) times daily as  needed for mild constipation. 04/03/23   Kathlen Mody, MD  feeding supplement (ENSURE ENLIVE / ENSURE PLUS) LIQD Take 237 mLs by mouth 2 (two) times daily between meals. 04/04/23 07/03/23  Kathlen Mody, MD  levETIRAcetam (KEPPRA) 500 MG tablet Take 1 tablet (500 mg total) by mouth 2 (two) times daily. 04/03/23 07/02/23  Kathlen Mody, MD  levothyroxine (SYNTHROID) 50 MCG tablet Take 1 tablet (50 mcg total) by mouth daily at 6 (six) AM. 04/04/23 05/04/23  Kathlen Mody, MD  Multiple Vitamin (MULTIVITAMIN WITH MINERALS) TABS tablet Take 1 tablet by mouth daily. Patient not taking: Reported on 04/16/2023 04/04/23   Kathlen Mody, MD  polyethylene glycol (MIRALAX / GLYCOLAX) 17 g packet Take 17 g by mouth daily as needed for moderate constipation. 04/03/23   Kathlen Mody, MD  sodium chloride 1 g tablet Take 1 g by mouth 2 (two) times daily with a meal.    [provider]      Allergies    Iodinated contrast media and Tamsulosin    Review of Systems   Review of Systems  Genitourinary:  Positive for difficulty urinating.  Neurological:  Positive for syncope.  All other systems reviewed and are negative.   Physical Exam Updated Vital Signs BP 129/74   Pulse 88   Temp (!) 97.4 F (36.3 C) (Oral)   Resp 18  Ht 5\' 10"  (1.778 m)   Wt 72.7 kg   SpO2 99%   BMI 23.00 kg/m  Physical Exam Vitals and nursing note reviewed.  Constitutional:      General: He is not in acute distress.    Appearance: Normal appearance. He is well-developed. He is not ill-appearing, toxic-appearing or diaphoretic.  HENT:     Head: Normocephalic and atraumatic.     Right Ear: External ear normal.     Left Ear: External ear normal.     Nose: Nose normal.     Mouth/Throat:     Mouth: Mucous membranes are moist.  Eyes:     Extraocular Movements: Extraocular movements intact.     Conjunctiva/sclera: Conjunctivae normal.  Cardiovascular:     Rate and Rhythm: Normal rate and regular rhythm.  Pulmonary:     Effort:  Pulmonary effort is normal. No respiratory distress.  Abdominal:     General: There is no distension.     Palpations: Abdomen is soft.     Tenderness: There is no abdominal tenderness.  Musculoskeletal:        General: No swelling. Normal range of motion.     Cervical back: Normal range of motion and neck supple.     Right lower leg: No edema.     Left lower leg: No edema.  Skin:    General: Skin is warm and dry.     Coloration: Skin is not jaundiced or pale.  Neurological:     General: No focal deficit present.     Mental Status: He is alert.  Psychiatric:        Mood and Affect: Mood normal.        Behavior: Behavior normal.        Thought Content: Thought content normal.        Judgment: Judgment normal.     ED Results / Procedures / Treatments   Labs (all labs ordered are listed, but only abnormal results are displayed) Labs Reviewed  BASIC METABOLIC PANEL - Abnormal; Notable for the following components:      Result Value   Sodium 133 (*)    Glucose, Bld 118 (*)    Calcium 8.2 (*)    All other components within normal limits  CBC - Abnormal; Notable for the following components:   RBC 3.76 (*)    Hemoglobin 11.7 (*)    HCT 36.6 (*)    Platelets 435 (*)    All other components within normal limits  HEPATIC FUNCTION PANEL - Abnormal; Notable for the following components:   Total Protein 5.7 (*)    Albumin 2.8 (*)    Indirect Bilirubin 0.2 (*)    All other components within normal limits  MAGNESIUM  LEVETIRACETAM LEVEL  URINALYSIS, ROUTINE W REFLEX MICROSCOPIC  CBG MONITORING, ED  TROPONIN I (HIGH SENSITIVITY)  TROPONIN I (HIGH SENSITIVITY)    EKG EKG Interpretation Date/Time:  Monday May 17 2023 13:27:24 EDT Ventricular Rate:  76 PR Interval:  178 QRS Duration:  106 QT Interval:  391 QTC Calculation: 440 R Axis:   27  Text Interpretation: Sinus rhythm Low voltage, precordial leads Confirmed by Gloris Manchester [phone]) on 05/17/2023 2:15:41  PM  Radiology DG Chest Portable 1 View  Result Date: 05/17/2023 CLINICAL DATA:  Syncope. EXAM: PORTABLE CHEST 1 VIEW COMPARISON:  Mar 30, 2023. FINDINGS: Stable cardiomediastinal silhouette. Both lungs are clear. The visualized skeletal structures are unremarkable. IMPRESSION: No active disease. Electronically Signed   By: Fayrene Fearing  Christen Butter M.D.   On: 05/17/2023 15:20    Procedures Procedures    Medications Ordered in ED Medications  levETIRAcetam (KEPPRA) IVPB 500 mg/100 mL premix (0 mg Intravenous Stopped 05/17/23 1400)  lactated ringers bolus 500 mL (0 mLs Intravenous Stopped 05/17/23 1447)  lactated ringers bolus 500 mL (500 mLs Intravenous New Bag/Given 05/17/23 1503)    ED Course/ Medical Decision Making/ A&P Clinical Course as of 05/17/23 1609  Mon May 17, 2023  1513 Assumed care from Dr. Durwin Nora [RP]    Clinical Course User Index [RP] Rondel Baton, MD                             Medical Decision Making Amount and/or Complexity of Data Reviewed Labs: ordered. Radiology: ordered.  Risk Prescription drug management.   This patient presents to the ED for concern of syncope, this involves an extensive number of treatment options, and is a complaint that carries with it a high risk of complications and morbidity.  The differential diagnosis includes vasovagal episode, arrhythmia, dehydration, anemia, metabolic derangements, seizure   Co morbidities that complicate the patient evaluation  seizures, meningioma, malnutrition, urine retention   Additional history obtained:  Additional history obtained from patient's wife External records from outside source obtained and reviewed including EMR   Lab Tests:  I Ordered, and personally interpreted labs.  The pertinent results include: Baseline anemia, no leukocytosis, normal electrolytes, normal troponin   Imaging Studies ordered:  I ordered imaging studies including chest x-ray, CT head I independently visualized  and interpreted imaging which showed no acute findings on x-ray, CT pending at time of signout. I agree with the radiologist interpretation   Cardiac Monitoring: / EKG:  The patient was maintained on a cardiac monitor.  I personally viewed and interpreted the cardiac monitored which showed an underlying rhythm of: Sinus rhythm   Problem List / ED Course / Critical interventions / Medication management  Patient presents after a syncopal episode.  This occurred at his urology office shortly after his Foley catheter was removed.  He had a prodrome of lightheadedness.  Wife, who witnessed the episode, states that he had an LOC of approximately 1 minute.  At the time, his blood pressure was low.  He had a quick return to mental baseline afterwards.  He arrives in the ED alert and oriented with normal blood pressure.  Although he has a seizure history, history is more consistent with a vasovagal syncope.  Patient is well-appearing on exam.  History is limited by his severe hearing impairment.  He denies any current complaints.  He has no focal neurologic deficits.  Patient was placed on monitor.  Workup was initiated.  Lab results are unremarkable.  In the ED, patient was initially unable to urinate.  On initial bladder scan, he had approximately 100 cc of urine in his bladder.  He was given IV fluids.  On retrial of voiding, he was again unable to go.  At this point, he had approximately 250 cc of urine in his bladder.  Patient will require assessment for urine retention and possible replacement of Foley catheter.  Care of patient was signed out to oncoming ED provider. I ordered medication including IV fluids for hydration; Keppra for seizure prophylaxis Reevaluation of the patient after these medicines showed that the patient improved I have reviewed the patients home medicines and have made adjustments as needed   Social Determinants of Health:  Hearing impaired, has access to outpatient  care        Final Clinical Impression(s) / ED Diagnoses Final diagnoses:  Vasovagal syncope    Rx / DC Orders ED Discharge Orders     None         Gloris Manchester, MD 05/17/23 1609

## 2023-05-17 NOTE — Discharge Instructions (Signed)
You were seen for your urinary retention and urinary tract infection in the emergency department.   At home, please take the antibiotics [facility]).  Follow-up with your primary doctor in 2 to 3 days to see if the Foley catheter can be removed  Check your MyChart online for the results of any tests that had not resulted by the time you left the emergency department.   Return immediately to the emergency department if you experience any of the following: Fevers, chills, flank pain, or any other concerning symptoms.    Thank you for visiting our Emergency Department. It was a pleasure taking care of you today.

## 2023-05-17 NOTE — ED Notes (Signed)
RN assisted pt to [facility] to have BM. Pt had large BM. Pt cleaned. Linens changed. RN assisted pt back to bed. Pt resting comfortably in bed with call bell in reach. RN attempted to collect urine sample again but pt stated he is still unable to provide sample at this time.

## 2023-05-17 NOTE — ED Provider Notes (Signed)
  Physical Exam  BP 98/61   Pulse (!) 105   Temp 98.6 F (37 C) (Oral)   Resp 17   Ht 5\' 10"  (1.778 m)   Wt 72.7 kg   SpO2 95%   BMI 23.00 kg/m   Physical Exam  Procedures  Procedures  ED Course / MDM   Clinical Course as of 05/17/23 2317  Mon May 17, 2023  1513 Assumed care from Dr. Durwin Nora.  87 year old male with a history of seizures, meningioma, and malnutrition with recent admission for hyponatremia presents to the emergency department with a syncopal event.  Patient had a Foley removed and then passed out.  Was noted to be hypotensive afterwards and was given 200 cc of fluids prior to arrival with improvement of his blood pressure.  No seizure-like activity was noted.  Here EKG has been unremarkable.  Was given Keppra and has a head CT that is ordered and pending now.  Lab work including troponin and CBG was unremarkable.  He is awaiting a voiding trial and will need to replace the Foley if he is unable to void.  Has a head CT that is pending now as well given his syncopal event.  [RP]  2316 CT head unremarkable.  Patient's urinalysis is returned and shows anemia of urinary tract infection.  Was given IV Rocephin.  No prior cultures.  Was prescribed Omnicef to take for the next 10 days for catheter associated UTI.  Did consider prescribing Flomax but does have some soft blood pressures which appear to be at his baseline.  Will him follow-up with his primary doctor in 2 to 3 days to see if his Foley catheter can be removed. [RP]    Clinical Course User Index [RP] Rondel Baton, MD   Medical Decision Making Amount and/or Complexity of Data Reviewed Labs: ordered. Radiology: ordered.  Risk Prescription drug management.      Rondel Baton, MD 05/17/23 [phone]

## 2023-05-19 LAB — LEVETIRACETAM LEVEL: Levetiracetam Lvl: 7.7 ug/mL — ABNORMAL LOW (ref 10.0–40.0)

## 2023-05-21 ENCOUNTER — Encounter (HOSPITAL_COMMUNITY): Payer: Self-pay

## 2023-05-21 ENCOUNTER — Emergency Department (HOSPITAL_COMMUNITY)
Admission: EM | Admit: 2023-05-21 | Discharge: 2023-05-21 | Discharge status: Against Medical Advice | Payer: PPO | Attending: Emergency Medicine | Admitting: Emergency Medicine

## 2023-05-21 DIAGNOSIS — Y732 Prosthetic and other implants, materials and accessory gastroenterology and urology devices associated with adverse incidents: Secondary | ICD-10-CM | POA: Diagnosis not present

## 2023-05-21 DIAGNOSIS — R531 Weakness: Secondary | ICD-10-CM | POA: Diagnosis present

## 2023-05-21 DIAGNOSIS — T83098A Other mechanical complication of other indwelling urethral catheter, initial encounter: Secondary | ICD-10-CM | POA: Diagnosis not present

## 2023-05-21 DIAGNOSIS — T83011A Breakdown (mechanical) of indwelling urethral catheter, initial encounter: Secondary | ICD-10-CM

## 2023-05-21 DIAGNOSIS — Z5329 Procedure and treatment not carried out because of patient's decision for other reasons: Secondary | ICD-10-CM | POA: Diagnosis not present

## 2023-05-21 LAB — HEPATIC FUNCTION PANEL
ALT: 18 U/L (ref 0–44)
AST: 27 U/L (ref 15–41)
Albumin: 3.2 g/dL — ABNORMAL LOW (ref 3.5–5.0)
Alkaline Phosphatase: 62 U/L (ref 38–126)
Bilirubin, Direct: 0.1 mg/dL (ref 0.0–0.2)
Indirect Bilirubin: 0.7 mg/dL (ref 0.3–0.9)
Total Bilirubin: 0.8 mg/dL (ref 0.3–1.2)
Total Protein: 6.5 g/dL (ref 6.5–8.1)

## 2023-05-21 LAB — CBC
HCT: 34.1 % — ABNORMAL LOW (ref 39.0–52.0)
Hemoglobin: 11 g/dL — ABNORMAL LOW (ref 13.0–17.0)
MCH: 30.8 pg (ref 26.0–34.0)
MCHC: 32.3 g/dL (ref 30.0–36.0)
MCV: 95.5 fL (ref 80.0–100.0)
Platelets: 531 10*3/uL — ABNORMAL HIGH (ref 150–400)
RBC: 3.57 MIL/uL — ABNORMAL LOW (ref 4.22–5.81)
RDW: 12.3 % (ref 11.5–15.5)
WBC: 14.6 10*3/uL — ABNORMAL HIGH (ref 4.0–10.5)
nRBC: 0 % (ref 0.0–0.2)

## 2023-05-21 LAB — BASIC METABOLIC PANEL
Anion gap: 9 (ref 5–15)
BUN: 28 mg/dL — ABNORMAL HIGH (ref 8–23)
CO2: 20 mmol/L — ABNORMAL LOW (ref 22–32)
Calcium: 8.6 mg/dL — ABNORMAL LOW (ref 8.9–10.3)
Chloride: 106 mmol/L (ref 98–111)
Creatinine, Ser: 1.17 mg/dL (ref 0.61–1.24)
GFR, Estimated: >60 mL/min (ref >60)
Glucose, Bld: 107 mg/dL — ABNORMAL HIGH (ref 70–99)
Potassium: 3.8 mmol/L (ref 3.5–5.1)
Sodium: 135 mmol/L (ref 135–145)

## 2023-05-21 NOTE — ED Triage Notes (Signed)
Pt is coming in for weakness, and has an indwelling foley catheter, there is cause for concern of a UTI. Pt has had the weakness for 3 days, has had burning with urinating since Wednesday and some leaking around the catheter since Wednesday also. No new cough, no SHOB, no new fever.   Medic vitals  128/78 95hr 22rr 98%ra 28 cap  20g left forearm

## 2023-05-21 NOTE — Discharge Instructions (Addendum)
I strongly encourage you to stay in the ER to make sure that you can urinate, or else to put a new catheter in place.  You have had difficulty with urinating on your own in the past.  You did not want to stay for either of these options.  You understand that by leaving you are acting AGAINST MEDICAL ADVICE.  If you find that you are not able to urinate or you have worsening abdominal pain please return to the ER immediately.  Please continue in the meantime the antibiotics that were prescribed to you earlier this week for a possible urinary infection.  Follow-up with your primary care doctor or your urologist.

## 2023-05-21 NOTE — ED Notes (Signed)
Foley catheter was removed per md order and request.

## 2023-05-21 NOTE — ED Provider Notes (Signed)
[place] EM[facility]GENCY DEPARTMENT AT [facility] Provider Note   CSN: [id number] Arrival date & time: 05/21/23  1405     History  Chief Complaint  Patient presents with   Weakness    Aaron Hickman is a 87 y.o. male presenting to the ED with several complaints.  Primarily he is concerned that he is leaking urine around his Foley catheter, and has had diminished output inside the Foley catheter.  The patient is quite irritable on arrival and insisting the catheter be removed.  Per my review of the records he was seen in the ER approximately 4 days ago on 15 July, at which time they attempted to remove the catheter and he was unable to urinate.  I explained this to the patient but he repeatedly insisted catheter be removed and he will leave.  His wife is also present with him.  It appears that he was started on an antibiotic in the ED 4 days ago which she is continuing at home, [place].  He reports he is able to urinate around the catheter site denies abdominal pain.  He has had loose bowel movements for a few days.  Patient reports that he emptied the Foley catheter immediately prior to arrival  HPI     Home Medications Prior to Admission medications   Medication Sig Start Date End Date Taking? Authorizing Provider  cefdinir (OMNICEF) 300 MG capsule Take 1 capsule (300 mg total) by mouth 2 (two) times daily for 14 days. 05/17/23 05/31/23  Rondel Baton, MD  Cholecalciferol (D3) 50 MCG (2000 UT) TABS Take 1 tablet by mouth daily.    [provider]  dexamethasone (DECADRON) 4 MG tablet Take dexamethasone 4 mg twice daily for 7 days followed by  Dexamethasone 4  mg daily for 7 days. 04/03/23   Kathlen Mody, MD  docusate sodium (COLACE) 100 MG capsule Take 1 capsule (100 mg total) by mouth 2 (two) times daily as needed for mild constipation. 04/03/23   Kathlen Mody, MD  feeding supplement (ENSURE ENLIVE / ENSURE PLUS) LIQD Take 237 mLs by mouth 2 (two) times daily  between meals. 04/04/23 07/03/23  Kathlen Mody, MD  levETIRAcetam (KEPPRA) 500 MG tablet Take 1 tablet (500 mg total) by mouth 2 (two) times daily. 04/03/23 07/02/23  Kathlen Mody, MD  levothyroxine (SYNTHROID) 50 MCG tablet Take 1 tablet (50 mcg total) by mouth daily at 6 (six) AM. 04/04/23 05/04/23  Kathlen Mody, MD  Multiple Vitamin (MULTIVITAMIN WITH MINERALS) TABS tablet Take 1 tablet by mouth daily. Patient not taking: Reported on 04/16/2023 04/04/23   Kathlen Mody, MD  polyethylene glycol (MIRALAX / GLYCOLAX) 17 g packet Take 17 g by mouth daily as needed for moderate constipation. 04/03/23   Kathlen Mody, MD  sodium chloride 1 g tablet Take 1 g by mouth 2 (two) times daily with a meal.    [provider]      Allergies    Iodinated contrast media and Tamsulosin    Review of Systems   Review of Systems  Physical Exam Updated Vital Signs BP (!) 144/74 (BP Location: Right Arm)   Pulse (!) 101   Temp 98.2 F (36.8 C) (Oral)   Resp 18   SpO2 98%  Physical Exam Constitutional:      General: He is not in acute distress. HENT:     Head: Normocephalic and atraumatic.  Eyes:     Conjunctiva/sclera: Conjunctivae normal.     Pupils: Pupils are equal,  round, and reactive to light.  Cardiovascular:     Rate and Rhythm: Normal rate and regular rhythm.  Pulmonary:     Effort: Pulmonary effort is normal. No respiratory distress.  Abdominal:     General: There is no distension.     Tenderness: There is no abdominal tenderness.  Genitourinary:    Comments: Indwelling Foley catheter with no urine output  Skin:    General: Skin is warm and dry.  Neurological:     General: No focal deficit present.     Mental Status: He is alert. Mental status is at baseline.  Psychiatric:        Mood and Affect: Mood normal.        Behavior: Behavior normal.     ED Results / Procedures / Treatments   Labs (all labs ordered are listed, but only abnormal results are displayed) Labs Reviewed   BASIC METABOLIC PANEL - Abnormal; Notable for the following components:      Result Value   CO2 20 (*)    Glucose, Bld 107 (*)    BUN 28 (*)    Calcium 8.6 (*)    All other components within normal limits  CBC - Abnormal; Notable for the following components:   WBC 14.6 (*)    RBC 3.57 (*)    Hemoglobin 11.0 (*)    HCT 34.1 (*)    Platelets 531 (*)    All other components within normal limits  DIFFERENTIAL  HEPATIC FUNCTION PANEL  URINALYSIS, W/ REFLEX TO CULTURE (INFECTION SUSPECTED)  CBG MONITORING, ED    EKG None  Radiology No results found.  Procedures Procedures    Medications Ordered in ED Medications - No data to display  ED Course/ Medical Decision Making/ A&P                             Medical Decision Making Amount and/or Complexity of Data Reviewed Labs: ordered.   Patient was here complaining of leakage of urine around his Foley catheter site.  He is insisting on the catheter being removed.  I very clearly meticulously explained to him as well as his wife the concerns that he may not be able to urinate after the catheter is removed.  I encouraged him to stay for a void trial but he does not want to do so.  Instead the catheter was removed, and the patient was discharged, I did not want to provide a urine sample or have a new catheter placed.  He understands that he is at high risk of needing to return to the ER, and that he is acting against my advice.        Final Clinical Impression(s) / ED Diagnoses Final diagnoses:  Weakness  Malfunction of Foley catheter, initial encounter [facility])    Rx / DC Orders ED Discharge Orders     None         Guiseppe Flanagan, Kermit Balo, MD 05/21/23 1700

## 2023-06-11 ENCOUNTER — Emergency Department (HOSPITAL_COMMUNITY): Payer: PPO

## 2023-06-11 ENCOUNTER — Inpatient Hospital Stay (HOSPITAL_COMMUNITY)
Admission: EM | Admit: 2023-06-11 | Discharge: 2023-06-21 | DRG: 871 | Disposition: A | Discharge status: Hospice | Payer: PPO | Attending: Student | Admitting: Student

## 2023-06-11 ENCOUNTER — Other Ambulatory Visit: Payer: Self-pay

## 2023-06-11 DIAGNOSIS — D696 Thrombocytopenia, unspecified: Secondary | ICD-10-CM | POA: Diagnosis present

## 2023-06-11 DIAGNOSIS — Z1152 Encounter for screening for COVID-19: Secondary | ICD-10-CM | POA: Diagnosis not present

## 2023-06-11 DIAGNOSIS — G928 Other toxic encephalopathy: Secondary | ICD-10-CM | POA: Diagnosis not present

## 2023-06-11 DIAGNOSIS — E8809 Other disorders of plasma-protein metabolism, not elsewhere classified: Secondary | ICD-10-CM | POA: Diagnosis present

## 2023-06-11 DIAGNOSIS — N400 Enlarged prostate without lower urinary tract symptoms: Secondary | ICD-10-CM | POA: Diagnosis present

## 2023-06-11 DIAGNOSIS — A4159 Other Gram-negative sepsis: Secondary | ICD-10-CM | POA: Diagnosis not present

## 2023-06-11 DIAGNOSIS — R4182 Altered mental status, unspecified: Secondary | ICD-10-CM | POA: Diagnosis not present

## 2023-06-11 DIAGNOSIS — Z91041 Radiographic dye allergy status: Secondary | ICD-10-CM

## 2023-06-11 DIAGNOSIS — Z7189 Other specified counseling: Secondary | ICD-10-CM | POA: Diagnosis not present

## 2023-06-11 DIAGNOSIS — Z66 Do not resuscitate: Secondary | ICD-10-CM | POA: Diagnosis not present

## 2023-06-11 DIAGNOSIS — R569 Unspecified convulsions: Secondary | ICD-10-CM

## 2023-06-11 DIAGNOSIS — E876 Hypokalemia: Secondary | ICD-10-CM | POA: Diagnosis not present

## 2023-06-11 DIAGNOSIS — G40909 Epilepsy, unspecified, not intractable, without status epilepticus: Secondary | ICD-10-CM | POA: Diagnosis present

## 2023-06-11 DIAGNOSIS — D649 Anemia, unspecified: Secondary | ICD-10-CM | POA: Diagnosis present

## 2023-06-11 DIAGNOSIS — F03911 Unspecified dementia, unspecified severity, with agitation: Secondary | ICD-10-CM | POA: Diagnosis not present

## 2023-06-11 DIAGNOSIS — E440 Moderate protein-calorie malnutrition: Secondary | ICD-10-CM | POA: Diagnosis present

## 2023-06-11 DIAGNOSIS — A419 Sepsis, unspecified organism: Secondary | ICD-10-CM | POA: Diagnosis present

## 2023-06-11 DIAGNOSIS — I3139 Other pericardial effusion (noninflammatory): Secondary | ICD-10-CM | POA: Diagnosis present

## 2023-06-11 DIAGNOSIS — E039 Hypothyroidism, unspecified: Secondary | ICD-10-CM | POA: Diagnosis present

## 2023-06-11 DIAGNOSIS — N3000 Acute cystitis without hematuria: Secondary | ICD-10-CM

## 2023-06-11 DIAGNOSIS — Z515 Encounter for palliative care: Secondary | ICD-10-CM

## 2023-06-11 DIAGNOSIS — Z79890 Hormone replacement therapy (postmenopausal): Secondary | ICD-10-CM | POA: Diagnosis not present

## 2023-06-11 DIAGNOSIS — N1330 Unspecified hydronephrosis: Secondary | ICD-10-CM | POA: Diagnosis not present

## 2023-06-11 DIAGNOSIS — H9190 Unspecified hearing loss, unspecified ear: Secondary | ICD-10-CM | POA: Diagnosis present

## 2023-06-11 DIAGNOSIS — Z79899 Other long term (current) drug therapy: Secondary | ICD-10-CM

## 2023-06-11 DIAGNOSIS — N320 Bladder-neck obstruction: Secondary | ICD-10-CM | POA: Diagnosis present

## 2023-06-11 DIAGNOSIS — G936 Cerebral edema: Secondary | ICD-10-CM | POA: Diagnosis present

## 2023-06-11 DIAGNOSIS — N136 Pyonephrosis: Secondary | ICD-10-CM | POA: Diagnosis present

## 2023-06-11 DIAGNOSIS — N179 Acute kidney failure, unspecified: Secondary | ICD-10-CM | POA: Diagnosis present

## 2023-06-11 DIAGNOSIS — Z86011 Personal history of benign neoplasm of the brain: Secondary | ICD-10-CM

## 2023-06-11 DIAGNOSIS — A498 Other bacterial infections of unspecified site: Secondary | ICD-10-CM | POA: Diagnosis not present

## 2023-06-11 DIAGNOSIS — R55 Syncope and collapse: Secondary | ICD-10-CM | POA: Diagnosis not present

## 2023-06-11 DIAGNOSIS — F0390 Unspecified dementia without behavioral disturbance: Secondary | ICD-10-CM | POA: Diagnosis present

## 2023-06-11 DIAGNOSIS — D329 Benign neoplasm of meninges, unspecified: Secondary | ICD-10-CM | POA: Diagnosis not present

## 2023-06-11 DIAGNOSIS — Z923 Personal history of irradiation: Secondary | ICD-10-CM

## 2023-06-11 DIAGNOSIS — G9341 Metabolic encephalopathy: Secondary | ICD-10-CM | POA: Diagnosis not present

## 2023-06-11 DIAGNOSIS — K5730 Diverticulosis of large intestine without perforation or abscess without bleeding: Secondary | ICD-10-CM | POA: Diagnosis present

## 2023-06-11 DIAGNOSIS — Z888 Allergy status to other drugs, medicaments and biological substances status: Secondary | ICD-10-CM

## 2023-06-11 DIAGNOSIS — N390 Urinary tract infection, site not specified: Secondary | ICD-10-CM | POA: Diagnosis present

## 2023-06-11 DIAGNOSIS — R6520 Severe sepsis without septic shock: Secondary | ICD-10-CM | POA: Diagnosis present

## 2023-06-11 DIAGNOSIS — E860 Dehydration: Secondary | ICD-10-CM | POA: Diagnosis present

## 2023-06-11 DIAGNOSIS — I700 Atherosclerosis of aorta: Secondary | ICD-10-CM | POA: Diagnosis present

## 2023-06-11 DIAGNOSIS — E872 Acidosis, unspecified: Secondary | ICD-10-CM | POA: Diagnosis not present

## 2023-06-11 DIAGNOSIS — Z91148 Patient's other noncompliance with medication regimen for other reason: Secondary | ICD-10-CM

## 2023-06-11 DIAGNOSIS — R0902 Hypoxemia: Secondary | ICD-10-CM | POA: Diagnosis present

## 2023-06-11 DIAGNOSIS — Z87891 Personal history of nicotine dependence: Secondary | ICD-10-CM

## 2023-06-11 DIAGNOSIS — R451 Restlessness and agitation: Secondary | ICD-10-CM | POA: Diagnosis not present

## 2023-06-11 DIAGNOSIS — R008 Other abnormalities of heart beat: Secondary | ICD-10-CM | POA: Diagnosis not present

## 2023-06-11 DIAGNOSIS — Z6822 Body mass index (BMI) 22.0-22.9, adult: Secondary | ICD-10-CM

## 2023-06-11 DIAGNOSIS — T50905A Adverse effect of unspecified drugs, medicaments and biological substances, initial encounter: Secondary | ICD-10-CM | POA: Diagnosis not present

## 2023-06-11 LAB — CULTURE, BLOOD (ROUTINE X 2): Special Requests: ADEQUATE

## 2023-06-11 LAB — URINALYSIS, W/ REFLEX TO CULTURE (INFECTION SUSPECTED)
Bilirubin Urine: NEGATIVE
Glucose, UA: NEGATIVE mg/dL
Ketones, ur: NEGATIVE mg/dL
Nitrite: POSITIVE — AB
Protein, ur: NEGATIVE mg/dL
RBC / HPF: >50 RBC/hpf (ref 0–5)
Specific Gravity, Urine: 1.01 (ref 1.005–1.030)
WBC, UA: >50 WBC/hpf (ref 0–5)
pH: 5 (ref 5.0–8.0)

## 2023-06-11 LAB — COMPREHENSIVE METABOLIC PANEL
ALT: 17 U/L (ref 0–44)
AST: 30 U/L (ref 15–41)
Albumin: 2.6 g/dL — ABNORMAL LOW (ref 3.5–5.0)
Alkaline Phosphatase: 64 U/L (ref 38–126)
Anion gap: 11 (ref 5–15)
BUN: 66 mg/dL — ABNORMAL HIGH (ref 8–23)
CO2: 17 mmol/L — ABNORMAL LOW (ref 22–32)
Calcium: 8.3 mg/dL — ABNORMAL LOW (ref 8.9–10.3)
Chloride: 113 mmol/L — ABNORMAL HIGH (ref 98–111)
Creatinine, Ser: 3.92 mg/dL — ABNORMAL HIGH (ref 0.61–1.24)
GFR, Estimated: 14 mL/min — ABNORMAL LOW (ref >60)
Glucose, Bld: 119 mg/dL — ABNORMAL HIGH (ref 70–99)
Potassium: 4.1 mmol/L (ref 3.5–5.1)
Sodium: 141 mmol/L (ref 135–145)
Total Bilirubin: 0.8 mg/dL (ref 0.3–1.2)
Total Protein: 5.3 g/dL — ABNORMAL LOW (ref 6.5–8.1)

## 2023-06-11 LAB — CBC WITH DIFFERENTIAL/PLATELET
Abs Immature Granulocytes: 2.59 10*3/uL — ABNORMAL HIGH (ref 0.00–0.07)
Basophils Absolute: 0.1 10*3/uL (ref 0.0–0.1)
Basophils Relative: 0 %
Eosinophils Absolute: 0.2 10*3/uL (ref 0.0–0.5)
Eosinophils Relative: 1 %
HCT: 29.3 % — ABNORMAL LOW (ref 39.0–52.0)
Hemoglobin: 9.4 g/dL — ABNORMAL LOW (ref 13.0–17.0)
Immature Granulocytes: 9 %
Lymphocytes Relative: 4 %
Lymphs Abs: 1.2 10*3/uL (ref 0.7–4.0)
MCH: 31.1 pg (ref 26.0–34.0)
MCHC: 32.1 g/dL (ref 30.0–36.0)
MCV: 97 fL (ref 80.0–100.0)
Monocytes Absolute: 0.9 10*3/uL (ref 0.1–1.0)
Monocytes Relative: 3 %
Neutro Abs: 23.4 10*3/uL — ABNORMAL HIGH (ref 1.7–7.7)
Neutrophils Relative %: 83 %
Platelets: 139 10*3/uL — ABNORMAL LOW (ref 150–400)
RBC: 3.02 MIL/uL — ABNORMAL LOW (ref 4.22–5.81)
RDW: 14.2 % (ref 11.5–15.5)
WBC: 28.3 10*3/uL — ABNORMAL HIGH (ref 4.0–10.5)
nRBC: 0 % (ref 0.0–0.2)

## 2023-06-11 LAB — RESP PANEL BY RT-PCR (RSV, FLU A&B, COVID)  RVPGX2
Influenza A by PCR: NEGATIVE
Influenza B by PCR: NEGATIVE
Resp Syncytial Virus by PCR: NEGATIVE
SARS Coronavirus 2 by RT PCR: NEGATIVE

## 2023-06-11 LAB — PROTIME-INR
INR: 1.7 — ABNORMAL HIGH (ref 0.8–1.2)
Prothrombin Time: 20.4 seconds — ABNORMAL HIGH (ref 11.4–15.2)

## 2023-06-11 LAB — I-STAT CG4 LACTIC ACID, ED: Lactic Acid, Venous: 1.7 mmol/L (ref 0.5–1.9)

## 2023-06-11 LAB — CBG MONITORING, ED: Glucose-Capillary: 113 mg/dL — ABNORMAL HIGH (ref 70–99)

## 2023-06-11 LAB — APTT: aPTT: 31 seconds (ref 24–36)

## 2023-06-11 MED ORDER — LACTATED RINGERS IV SOLN: INTRAVENOUS | Status: AC

## 2023-06-11 MED ORDER — SODIUM CHLORIDE 0.9 % IV SOLN: 1.0000 g | INTRAVENOUS | Status: DC

## 2023-06-11 MED ORDER — ENOXAPARIN SODIUM 30 MG/0.3ML IJ SOSY: 30.0000 mg | PREFILLED_SYRINGE | INTRAMUSCULAR | Status: DC

## 2023-06-11 MED ORDER — SODIUM CHLORIDE 1 G PO TABS
1.0000 g | ORAL_TABLET | Freq: Two times a day (BID) | ORAL | Status: DC
  Administered 2023-06-14 – 2023-06-21 (×12): 1 g via ORAL
  Filled 2023-06-11 (×13): qty 1

## 2023-06-11 MED ORDER — LEVETIRACETAM IN NACL 500 MG/100ML IV SOLN
500.0000 mg | Freq: Every day | INTRAVENOUS | Status: DC
  Administered 2023-06-11 – 2023-06-12 (×2): 500 mg via INTRAVENOUS
  Filled 2023-06-11 (×2): qty 100

## 2023-06-11 MED ORDER — SODIUM CHLORIDE 0.9 % IV SOLN
2.0000 g | Freq: Once | INTRAVENOUS | Status: AC
  Administered 2023-06-11: 2 g via INTRAVENOUS
  Filled 2023-06-11: qty 20

## 2023-06-11 MED ORDER — LEVOTHYROXINE SODIUM 50 MCG PO TABS
50.0000 ug | ORAL_TABLET | Freq: Every day | ORAL | Status: DC
  Administered 2023-06-14 – 2023-06-21 (×8): 50 ug via ORAL
  Filled 2023-06-11 (×10): qty 1

## 2023-06-11 MED ORDER — ENOXAPARIN SODIUM 40 MG/0.4ML IJ SOSY: 40.0000 mg | PREFILLED_SYRINGE | INTRAMUSCULAR | Status: DC

## 2023-06-11 MED ORDER — LACTATED RINGERS IV BOLUS (SEPSIS)
1000.0000 mL | Freq: Once | INTRAVENOUS | Status: AC
  Administered 2023-06-11: 1000 mL via INTRAVENOUS

## 2023-06-11 MED ORDER — SODIUM CHLORIDE 0.9 % IV SOLN
2.0000 g | INTRAVENOUS | Status: DC
  Administered 2023-06-12 – 2023-06-13 (×2): 2 g via INTRAVENOUS
  Filled 2023-06-11 (×2): qty 20

## 2023-06-11 MED ORDER — LORAZEPAM 2 MG/ML IJ SOLN
0.5000 mg | Freq: Once | INTRAMUSCULAR | Status: AC
  Administered 2023-06-11: 0.5 mg via INTRAVENOUS
  Filled 2023-06-11: qty 1

## 2023-06-11 NOTE — Assessment & Plan Note (Signed)
-  secondary to dehydration and acute renal failure -continue IV fluid and follow trend

## 2023-06-11 NOTE — Assessment & Plan Note (Signed)
-  UA grossly positive and presented with fever, tachycardia and leukocytosis -continue IV fluids and IV Rocephin -urine and blood culture pending

## 2023-06-11 NOTE — ED Notes (Signed)
ED TO INPATIENT HANDOFF REPORT  Name/Age/Gender Aaron Hickman 87 y.o. male  Code Status    Code Status Orders  (From admission, onward)           Start     Ordered   06/11/23 2103  Full code  Continuous       Question:  By:  Answer:  Consent: discussion documented in EHR   06/11/23 2103           Code Status History     Date Active Date Inactive Code Status Order ID Comments User Context   03/30/2023 2105 04/03/2023 2102 Full Code [id number]  Duayne Cal, NP ED       Home/SNF/Other Home  Chief Complaint Sepsis [facility]) [A41.9]  Level of Care/Admitting Diagnosis ED Disposition     ED Disposition  Admit   Condition  --   Comment  Hospital Area: [facility] [[id number]]  Level of Care: Telemetry [5]  Admit to tele based on following criteria: Other see comments  Comments: rate  May admit patient to [facility] or [facility] if equivalent level of care is available:: No  Covid Evaluation: Asymptomatic - no recent exposure (last 10 days) testing not required  Diagnosis: Sepsis [facility]) [[id number]]  Admitting Physician: Anselm Jungling [[id number]]  Attending Physician: Anselm Jungling [[id number]]  Certification:: I certify this patient will need inpatient services for at least 2 midnights  Estimated Length of Stay: 3          Medical History Past Medical History:  Diagnosis Date   Blood clots in brain    Tumors    in head    Allergies Allergies  Allergen Reactions   Iodinated Contrast Media Itching   Tamsulosin Other (See Comments)    Upsets stomach    IV Location/Drains/Wounds Patient Lines/Drains/Airways Status     Active Line/Drains/Airways     Name Placement date Placement time Site Days   Peripheral IV 06/11/23 20 G Anterior;Right Forearm 06/11/23  1607  Forearm  less than 1   Peripheral IV 06/11/23 18 G Anterior;Left Forearm 06/11/23  1608  Forearm  less than 1            Labs/Imaging Results for orders placed or  performed during the hospital encounter of 06/11/23 (from the past 48 hour(s))  CBG monitoring, ED     Status: Abnormal   Collection Time: 06/11/23  4:00 PM  Result Value Ref Range   Glucose-Capillary 113 (H) 70 - 99 mg/dL    Comment: Glucose reference range applies only to samples taken after fasting for at least 8 hours.  Resp panel by RT-PCR (RSV, Flu A&B, Covid) Anterior Nasal Swab     Status: None   Collection Time: 06/11/23  4:05 PM   Specimen: Anterior Nasal Swab  Result Value Ref Range   SARS Coronavirus 2 by RT PCR NEGATIVE NEGATIVE    Comment: (NOTE) SARS-CoV-2 target nucleic acids are NOT DETECTED.  The SARS-CoV-2 RNA is generally detectable in upper respiratory specimens during the acute phase of infection. The lowest concentration of SARS-CoV-2 viral copies this assay can detect is 138 copies/mL. A negative result does not preclude SARS-Cov-2 infection and should not be used as the sole basis for treatment or other patient management decisions. A negative result may occur with  improper specimen collection/handling, submission of specimen other than nasopharyngeal swab, presence of viral mutation(s) within the areas targeted by this assay, and inadequate number of viral copies(<138 copies/mL).  A negative result must be combined with clinical observations, patient history, and epidemiological information. The expected result is Negative.  Fact Sheet for Patients:  BloggerCourse.com  Fact Sheet for Healthcare Providers:  SeriousBroker.it  This test is no t yet approved or cleared by the [place] FDA and  has been authorized for detection and/or diagnosis of SARS-CoV-2 by FDA under an Emergency Use Authorization (EUA). This EUA will remain  in effect (meaning this test can be used) for the duration of the COVID-19 declaration under Section 564(b)(1) of the Act, 21 U.S.C.section 360bbb-3(b)(1), unless the  authorization is terminated  or revoked sooner.       Influenza A by PCR NEGATIVE NEGATIVE   Influenza B by PCR NEGATIVE NEGATIVE    Comment: (NOTE) The Xpert Xpress SARS-CoV-2/FLU/RSV plus assay is intended as an aid in the diagnosis of influenza from Nasopharyngeal swab specimens and should not be used as a sole basis for treatment. Nasal washings and aspirates are unacceptable for Xpert Xpress SARS-CoV-2/FLU/RSV testing.  Fact Sheet for Patients: BloggerCourse.com  Fact Sheet for Healthcare Providers: SeriousBroker.it  This test is not yet approved or cleared by the [place] FDA and has been authorized for detection and/or diagnosis of SARS-CoV-2 by FDA under an Emergency Use Authorization (EUA). This EUA will remain in effect (meaning this test can be used) for the duration of the COVID-19 declaration under Section 564(b)(1) of the Act, 21 U.S.C. section 360bbb-3(b)(1), unless the authorization is terminated or revoked.     Resp Syncytial Virus by PCR NEGATIVE NEGATIVE    Comment: (NOTE) Fact Sheet for Patients: BloggerCourse.com  Fact Sheet for Healthcare Providers: SeriousBroker.it  This test is not yet approved or cleared by the [place] FDA and has been authorized for detection and/or diagnosis of SARS-CoV-2 by FDA under an Emergency Use Authorization (EUA). This EUA will remain in effect (meaning this test can be used) for the duration of the COVID-19 declaration under Section 564(b)(1) of the Act, 21 U.S.C. section 360bbb-3(b)(1), unless the authorization is terminated or revoked.  Performed at [facility], [address], [place], [place] [address]   Comprehensive metabolic panel     Status: Abnormal   Collection Time: 06/11/23  4:05 PM  Result Value Ref Range   Sodium 141 135 - 145 mmol/L   Potassium 4.1 3.5 - 5.1 mmol/L    Chloride 113 (H) 98 - 111 mmol/L   CO2 17 (L) 22 - 32 mmol/L   Glucose, Bld 119 (H) 70 - 99 mg/dL    Comment: Glucose reference range applies only to samples taken after fasting for at least 8 hours.   BUN 66 (H) 8 - 23 mg/dL   Creatinine, Ser 6.04 (H) 0.61 - 1.24 mg/dL   Calcium 8.3 (L) 8.9 - 10.3 mg/dL   Total Protein 5.3 (L) 6.5 - 8.1 g/dL   Albumin 2.6 (L) 3.5 - 5.0 g/dL   AST 30 15 - 41 U/L   ALT 17 0 - 44 U/L   Alkaline Phosphatase 64 38 - 126 U/L   Total Bilirubin 0.8 0.3 - 1.2 mg/dL   GFR, Estimated 14 (L) >60 mL/min    Comment: (NOTE) Calculated using the CKD-EPI Creatinine Equation (2021)    Anion gap 11 5 - 15    Comment: Performed at [facility], [address] [place], [place] [address]  CBC with Differential     Status: Abnormal   Collection Time: 06/11/23  4:05 PM  Result  Value Ref Range   WBC 28.3 (H) 4.0 - 10.5 K/uL   RBC 3.02 (L) 4.22 - 5.81 MIL/uL   Hemoglobin 9.4 (L) 13.0 - 17.0 g/dL   HCT 01.0 (L) 27.2 - 53.6 %   MCV 97.0 80.0 - 100.0 fL   MCH 31.1 26.0 - 34.0 pg   MCHC 32.1 30.0 - 36.0 g/dL   RDW 64.4 03.4 - 74.2 %   Platelets 139 (L) 150 - 400 K/uL   nRBC 0.0 0.0 - 0.2 %   Neutrophils Relative % 83 %   Neutro Abs 23.4 (H) 1.7 - 7.7 K/uL   Lymphocytes Relative 4 %   Lymphs Abs 1.2 0.7 - 4.0 K/uL   Monocytes Relative 3 %   Monocytes Absolute 0.9 0.1 - 1.0 K/uL   Eosinophils Relative 1 %   Eosinophils Absolute 0.2 0.0 - 0.5 K/uL   Basophils Relative 0 %   Basophils Absolute 0.1 0.0 - 0.1 K/uL   WBC Morphology Mild Left Shift (1-5% metas, occ myelo)    Immature Granulocytes 9 %   Abs Immature Granulocytes 2.59 (H) 0.00 - 0.07 K/uL    Comment: Performed at [facility], [address], [place], [place] [address]  Protime-INR     Status: Abnormal   Collection Time: 06/11/23  4:05 PM  Result Value Ref Range   Prothrombin Time 20.4 (H) 11.4 - 15.2 seconds   INR 1.7 (H) 0.8 - 1.2    Comment: (NOTE) INR goal  varies based on device and disease states. Performed at [facility], [address] [place], [place] [address]   APTT     Status: None   Collection Time: 06/11/23  4:05 PM  Result Value Ref Range   aPTT 31 24 - 36 seconds    Comment: Performed at [facility], [address] [place], [place] [address]  Blood Culture (routine x 2)     Status: None (Preliminary result)   Collection Time: 06/11/23  4:08 PM   Specimen: BLOOD LEFT FOREARM  Result Value Ref Range   Specimen Description      BLOOD LEFT FOREARM Performed at [facility], [address] [place], [place] [address]    Special Requests      BOTTLES DRAWN AEROBIC AND ANAEROBIC Blood Culture adequate volume Performed at [facility], [address], [place], [place] [address]    Culture PENDING    Report Status PENDING   I-Stat Lactic Acid, ED     Status: None   Collection Time: 06/11/23  4:14 PM  Result Value Ref Range   Lactic Acid, Venous 1.7 0.5 - 1.9 mmol/L  Urinalysis, w/ Reflex to Culture (Infection Suspected) -Urine, Clean Catch     Status: Abnormal   Collection Time: 06/11/23  6:14 PM  Result Value Ref Range   Specimen Source URINE, CLEAN CATCH    Color, Urine YELLOW YELLOW   APPearance CLEAR CLEAR   Specific Gravity, Urine 1.010 1.005 - 1.030   pH 5.0 5.0 - 8.0   Glucose, UA NEGATIVE NEGATIVE mg/dL   Hgb urine dipstick LARGE (A) NEGATIVE   Bilirubin Urine NEGATIVE NEGATIVE   Ketones, ur NEGATIVE NEGATIVE mg/dL   Protein, ur NEGATIVE NEGATIVE mg/dL   Nitrite POSITIVE (A) NEGATIVE   Leukocytes,Ua MODERATE (A) NEGATIVE   RBC / HPF >50 0 - 5 RBC/hpf   WBC, UA >50 0 - 5 WBC/hpf    Comment:  Reflex urine culture not performed if WBC <=10, OR if Squamous epithelial cells >5. If Squamous epithelial cells >5 suggest recollection.    Bacteria, UA MANY (A) NONE SEEN   Squamous Epithelial / HPF 0-5 0 - 5 /HPF   Mucus PRESENT     Comment:  Performed at [facility], [address] [place], [place] [address]   CT ABDOMEN PELVIS WO CONTRAST  Result Date: 06/11/2023 CLINICAL DATA:  Sepsis. EXAM: CT ABDOMEN AND PELVIS WITHOUT CONTRAST TECHNIQUE: Multidetector CT imaging of the abdomen and pelvis was performed following the standard protocol without IV contrast. RADIATION DOSE REDUCTION: This exam was performed according to the departmental dose-optimization program which includes automated exposure control, adjustment of the mA and/or kV according to patient size and/or use of iterative reconstruction technique. COMPARISON:  CT dated 03/29/2023. FINDINGS: Evaluation of this exam is limited in the absence of intravenous contrast. Lower chest: Minimal bibasilar dependent atelectasis. There is coronary vascular calcification. No intra-abdominal free air or free fluid. Hepatobiliary: The liver is unremarkable. No BD dilatation. The gallbladder is unremarkable. Pancreas: The distal body and tail of the pancreas are atrophic. There is a focus of coarse calcification in the head and uncinate process of the pancreas similar to prior CT, likely sequela of chronic pancreatitis. No active inflammatory changes. Spleen: Normal in size without focal abnormality. Adrenals/Urinary Tract: The adrenal glands are unremarkable. There is mild bilateral hydronephrosis, new since the prior CT. No stone. There is diffuse thickened and trabeculated appearance of the bladder wall indicative of chronic bladder outlet obstruction. Correlation with urinalysis recommended to exclude superimposed UTI. Stomach/Bowel: Mild sigmoid diverticulosis without active inflammatory changes. There is no bowel obstruction or active inflammation. The appendix is normal. Vascular/Lymphatic: Moderate aortoiliac atherosclerotic disease. The IVC is unremarkable. No portal venous gas. There is no adenopathy. Reproductive: Enlarged prostate gland with median lobe hypertrophy  protruding through the base of the bladder. Other: None Musculoskeletal: Degenerative changes.  No acute osseous pathology. IMPRESSION: 1. Mild bilateral hydronephrosis, new since the prior CT, likely related to chronic bladder outlet obstruction. No stone. 2. Enlarged prostate gland with findings of chronic bladder outlet obstruction. 3. Mild sigmoid diverticulosis. No bowel obstruction. Normal appendix. 4.  Aortic Atherosclerosis (ICD10-I70.0). Electronically Signed   By: Elgie Collard M.D.   On: 06/11/2023 18:56   CT Head Wo Contrast  Result Date: 06/11/2023 CLINICAL DATA:  Mental status change, unknown cause EXAM: CT HEAD WITHOUT CONTRAST TECHNIQUE: Contiguous axial images were obtained from the base of the skull through the vertex without intravenous contrast. RADIATION DOSE REDUCTION: This exam was performed according to the departmental dose-optimization program which includes automated exposure control, adjustment of the mA and/or kV according to patient size and/or use of iterative reconstruction technique. COMPARISON:  CT head 05/17/23, Brain MRI 01/02/22 FINDINGS: Brain: No hemorrhage. No extra-axial fluid collection. There is a peripheral 1.3 x 0.7 cm hyperdense lesion along the right cerebral convexity (series 2, image 20). This was likely present on prior imaging. This was seen on prior brain MRI dated 01/02/2022, and favored to represent a small meningioma. Redemonstrated vasogenic edema in the anterior right temporal lobe, and the right insula. Known lesion abutting the right cavernous sinus is poorly visualized due to CT technique. No hemorrhage. No extra-axial fluid collection. No CT evidence of an acute cortical infarct. No midline shift. Vascular: No hyperdense vessel or unexpected calcification. Skull: Normal. Negative for fracture or focal lesion. Sinuses/Orbits: no middle ear or mastoid effusion. Paranasal sinuses are  clear. Bilateral lens replacement. Orbits are otherwise unremarkable.  Other: None. IMPRESSION: 1. No acute intracranial abnormality. 2. Redemonstrated vasogenic edema in the anterior right temporal lobe, and the right insula. Known lesion abutting the right cavernous sinus is poorly visualized due to CT technique. Consider further evaluation with contrast-enhanced brain MRI more definitive characterization. Electronically Signed   By: Lorenza Cambridge M.D.   On: 06/11/2023 18:07   DG Chest Port 1 View  Result Date: 06/11/2023 CLINICAL DATA:  Altered mental status. EXAM: PORTABLE CHEST 1 VIEW COMPARISON:  May 17, 2023. FINDINGS: The heart size and mediastinal contours are within normal limits. Hypoinflation of the lungs with minimal bibasilar subsegmental atelectasis. The visualized skeletal structures are unremarkable. IMPRESSION: Hypoinflation of the lungs with minimal bibasilar subsegmental atelectasis. Electronically Signed   By: Lupita Raider M.D.   On: 06/11/2023 16:39    Pending Labs Unresulted Labs (From admission, onward)     Start     Ordered   06/12/23 0500  CBC  Tomorrow morning,   R        06/11/23 2103   06/12/23 0500  Basic metabolic panel  Tomorrow morning,   R        06/11/23 2103   06/11/23 1814  Urine Culture  Once,   R        06/11/23 1814   06/11/23 1603  Blood Culture (routine x 2)  (Septic presentation on arrival (screening labs, nursing and treatment orders for obvious sepsis))  BLOOD CULTURE X 2,   STAT      06/11/23 1603            Vitals/Pain Today's Vitals   06/11/23 1611 06/11/23 1615 06/11/23 1715 06/11/23 1744  BP:   113/67   Pulse:   92   Resp: (!) 25 20 20    Temp:    97.7 F (36.5 C)  TempSrc:      SpO2:   100%     Isolation Precautions No active isolations  Medications Medications  lactated ringers infusion ( Intravenous New Bag/Given 06/11/23 1615)  enoxaparin (LOVENOX) injection 40 mg (has no administration in time range)  cefTRIAXone (ROCEPHIN) 2 g in sodium chloride 0.9 % 100 mL IVPB (has no administration  in time range)  lactated ringers bolus 1,000 mL (0 mLs Intravenous Stopped 06/11/23 1741)  cefTRIAXone (ROCEPHIN) 2 g in sodium chloride 0.9 % 100 mL IVPB (0 g Intravenous Stopped 06/11/23 1741)  LORazepam (ATIVAN) injection 0.5 mg (0.5 mg Intravenous Given 06/11/23 1741)    Mobility walks with person assist

## 2023-06-11 NOTE — H&P (Addendum)
History and Physical    Patient: Aaron Hickman ZOX:[id number] DOB: Mar 12, 1936 DOA: 06/11/2023 DOS: the patient was seen and examined on 06/11/2023 PCP: [facility]  Patient coming from: Home  Chief Complaint:  Chief Complaint  Patient presents with   Altered Mental Status   HPI: Aaron Hickman is a 87 y.o. male with medical history significant of medial sphenoid meningoma s/p radiation, chronic vasogenic edema (noncompliant with steroids), seizures, BPH who presents with altered mental status.   Pt arousable with light noxious stimuli and would push my hand away but otherwise remained asleep. Spoke with wife over the phone who provides limited hx. Says for the past 2 days he has just been sleeping and not wanting to do anything. Normally does everything on his own. Has not been eating, drinking or taking his medications including his antiepileptics. Had a UTI that was treated 2 weeks ago. He has hx of urinary retention requiring foley but wife thinks he has been urinating frequently. No recent seizure activities.   In the ED, he was febrile to 101F, tachycardic and normotensive.  Had leukocytosis of 28.3, hemoglobin of 9.4 down from 11.  Lactate of 1.7.  Had metabolic acidosis with CO2 of 17 and normal anion gap.  Creatinine is significantly elevated at 3.92 from 1.1.  UA grossly positive with moderate leukocyte, positive nitrite and many bacteria.  COVID/RSV/flu.  He was started on IV fluids and antibiotics. Hospitalist consulted for admission.    Review of Systems: unable to review all systems due to the inability of the patient to answer questions. Past Medical History:  Diagnosis Date   Blood clots in brain    Tumors    in head   No past surgical history on file. Social History:  reports that he has quit smoking. His smoking use included cigarettes. He has never used smokeless tobacco. He reports that he does not currently use alcohol. He reports that  he does not use drugs.  Allergies  Allergen Reactions   Iodinated Contrast Media Itching   Tamsulosin Other (See Comments)    Upsets stomach    No pertinent family hx  Prior to Admission medications   Medication Sig Start Date End Date Taking? Authorizing Provider  levETIRAcetam (KEPPRA) 500 MG tablet Take 1 tablet (500 mg total) by mouth 2 (two) times daily. 04/03/23 07/02/23 Yes Kathlen Mody, MD  levothyroxine (SYNTHROID) 50 MCG tablet Take 1 tablet (50 mcg total) by mouth daily at 6 (six) AM. 04/04/23 06/11/23 Yes Kathlen Mody, MD  Multiple Vitamin (MULTIVITAMIN WITH MINERALS) TABS tablet Take 1 tablet by mouth daily. 04/04/23  Yes Kathlen Mody, MD  sodium chloride 1 g tablet Take 1 g by mouth 2 (two) times daily with a meal.   Yes [provider]    Physical Exam: Vitals:   06/11/23 1715 06/11/23 1744 06/11/23 2117 06/11/23 2129  BP: 113/67  110/64   Pulse: 92  83   Resp: 20  14   Temp:  97.7 F (36.5 C)  97.7 F (36.5 C)  TempSrc:    Axillary  SpO2: 100%  100%    Constitutional: NAD, calm, chronically ill appearing elderly male laying in bed asleep. Responsive to light noxious stimuli and would push my hand away and pull covers up.  Eyes:  lids and conjunctivae normal ENMT: Mucous membranes are moist.  Neck: normal, supple, no masses, no thyromegaly Respiratory: clear to auscultation bilaterally, no wheezing, no crackles. Normal respiratory effort. No accessory  muscle use.  Cardiovascular: Regular rate and rhythm, no murmurs / rubs / gallops. No extremity edema.  Abdomen: no grimace with palpation, Bowel sounds positive.  Musculoskeletal: no clubbing / cyanosis. No joint deformity upper and lower extremities. Muscle wasting on all extremities Skin: no rashes, lesions, ulcers. No induration Neurologic: CN 2-12 grossly intact. Strength 5/5 in all 4.  Psychiatric: Normal judgment and insight. Alert and oriented x 3. Normal mood.   Data Reviewed:  See HPI  Assessment  and Plan: * Sepsis secondary to UTI (HCC) -UA grossly positive and presented with fever, tachycardia and leukocytosis -continue IV fluids and IV Rocephin -urine and blood culture pending  Acute metabolic encephalopathy -Secondary to urosepsis -continue to monitor with treatment above. Normally is responsive and independent on all ADLS.   Bilateral hydronephrosis - Likely secondary to chronic outlet obstruction with enlarged prostate - no retention on bladder scan   Acute renal failure (ARF) (HCC) -creatinine up to 3.92 with GFR for 14 secondary to dehydration -continue IV fluid rehydration -avoid nephrotoxic agents.   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   Consults: none  Family Communication: wife over the phone  Severity of Illness: The appropriate patient status for this patient is INPATIENT. Inpatient status is judged to be reasonable and necessary in order to provide the required intensity of service to ensure the patient's safety. The patient's presenting symptoms, physical exam findings, and initial radiographic and laboratory data in the context of their chronic comorbidities is felt to place them at high risk for further clinical deterioration. Furthermore, it is not anticipated that the patient will be medically stable for discharge from the hospital within 2 midnights of admission.   * I certify that at the point of admission it is my clinical judgment that the patient will require inpatient  hospital care spanning beyond 2 midnights from the point of admission due to high intensity of service, high risk for further deterioration and high frequency of surveillance required.*  Author: Anselm Jungling, DO 06/11/2023 9:39 PM  For on call review [email or web address].

## 2023-06-11 NOTE — Assessment & Plan Note (Addendum)
-   Likely secondary to chronic outlet obstruction with enlarged prostate - no retention on bladder scan

## 2023-06-11 NOTE — Sepsis Progress Note (Signed)
Sepsis protocol monitored by eLink ?

## 2023-06-11 NOTE — ED Triage Notes (Signed)
Patient BIB EMS from home c/o AMS. Family report increase confusion. Family report patient was diagnose with UTI 3 days ago and unable to take P.O. Antibiotic. Hx Brain Tumor.

## 2023-06-11 NOTE — Assessment & Plan Note (Addendum)
-  Keppra to be renally dose with acute renal failure -also will covert to IV dosing since not able to take oral

## 2023-06-11 NOTE — Assessment & Plan Note (Signed)
-  Secondary to urosepsis -continue to monitor with treatment above. Normally is responsive and independent on all ADLS.

## 2023-06-11 NOTE — Assessment & Plan Note (Signed)
-  creatinine up to 3.92 with GFR for 14 secondary to dehydration -continue IV fluid rehydration -avoid nephrotoxic agents.

## 2023-06-11 NOTE — Assessment & Plan Note (Signed)
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

## 2023-06-11 NOTE — ED Provider Notes (Signed)
[place] [facility]MENT AT [facility] Provider Note   CSN: [id number] Arrival date & time: 06/11/23  1539     History  Chief Complaint  Patient presents with   Altered Mental Status    Aaron Hickman is a 87 y.o. male with history of medial sphenoid meningoma s/p radiation, chronic vasogenic edema (noncompliant with steroids), seizures on trileptal who presents from home c/o AMS. Daughter and wife at bedside report increased confusion x 3 days. Family report patient was diagnosed with UTI 3 weeks ago and completed 14 days of an oral antibiotic. They state he has not had any focal weakness, slurred speech. He hasn't complained of any cough, CP, SOB, back pain. No known falls or seizures. Does have h/o brain tumor.   Past Medical History:  Diagnosis Date   Blood clots in brain    Tumors    in head       Home Medications Prior to Admission medications   Medication Sig Start Date End Date Taking? Authorizing Provider  levETIRAcetam (KEPPRA) 500 MG tablet Take 1 tablet (500 mg total) by mouth 2 (two) times daily. 04/03/23 07/02/23 Yes Kathlen Mody, MD  levothyroxine (SYNTHROID) 50 MCG tablet Take 1 tablet (50 mcg total) by mouth daily at 6 (six) AM. 04/04/23 06/11/23 Yes Kathlen Mody, MD  Multiple Vitamin (MULTIVITAMIN WITH MINERALS) TABS tablet Take 1 tablet by mouth daily. 04/04/23  Yes Kathlen Mody, MD  sodium chloride 1 g tablet Take 1 g by mouth 2 (two) times daily with a meal.   Yes [provider]      Allergies    Iodinated contrast media and Tamsulosin    Review of Systems   Review of Systems A 10 point review of systems was performed and is negative unless otherwise reported in HPI.  Physical Exam Updated Vital Signs BP 113/67   Pulse 92   Temp 97.7 F (36.5 C)   Resp 20   SpO2 100%  Physical Exam General: Normal appearing elderly male, lying in bed.  HEENT: PERRLA, Sclera anicteric, dry mucous membranes, trachea midline.   Cardiology: Regular tachycardic rate, no murmurs/rubs/gallops. BL radial and DP pulses equal bilaterally.  Resp: Normal respiratory rate and effort. CTAB, no wheezes, rhonchi, crackles.  Abd: Soft, non-tender, non-distended. No rebound tenderness or guarding.  GU: Deferred. MSK: No peripheral edema or signs of trauma. Extremities without deformity or TTP. No cyanosis or clubbing. Skin: warm, dry. No rashes or lesions. Neuro: A&Ox1, largely unable to answer questions, CNs II-XII grossly intact. MAEs. Sensation grossly intact.  Psych: Irritated, uncooperative, agitated  ED Results / Procedures / Treatments   Labs (all labs ordered are listed, but only abnormal results are displayed) Labs Reviewed  COMPREHENSIVE METABOLIC PANEL - Abnormal; Notable for the following components:      Result Value   Chloride 113 (*)    CO2 17 (*)    Glucose, Bld 119 (*)    BUN 66 (*)    Creatinine, Ser 3.92 (*)    Calcium 8.3 (*)    Total Protein 5.3 (*)    Albumin 2.6 (*)    GFR, Estimated 14 (*)    All other components within normal limits  CBC WITH DIFFERENTIAL/PLATELET - Abnormal; Notable for the following components:   WBC 28.3 (*)    RBC 3.02 (*)    Hemoglobin 9.4 (*)    HCT 29.3 (*)    Platelets 139 (*)    Neutro Abs 23.4 (*)  Abs Immature Granulocytes 2.59 (*)    All other components within normal limits  PROTIME-INR - Abnormal; Notable for the following components:   Prothrombin Time 20.4 (*)    INR 1.7 (*)    All other components within normal limits  URINALYSIS, W/ REFLEX TO CULTURE (INFECTION SUSPECTED) - Abnormal; Notable for the following components:   Hgb urine dipstick LARGE (*)    Nitrite POSITIVE (*)    Leukocytes,Ua MODERATE (*)    Bacteria, UA MANY (*)    All other components within normal limits  CBG MONITORING, ED - Abnormal; Notable for the following components:   Glucose-Capillary 113 (*)    All other components within normal limits  RESP PANEL BY RT-PCR (RSV, FLU  A&B, COVID)  RVPGX2  CULTURE, BLOOD (ROUTINE X 2)  CULTURE, BLOOD (ROUTINE X 2)  URINE CULTURE  APTT  I-STAT CG4 LACTIC ACID, ED    EKG EKG Interpretation Date/Time:  Friday June 11 2023 15:57:41 EDT Ventricular Rate:  106 PR Interval:  146 QRS Duration:  79 QT Interval:  333 QTC Calculation: 443 R Axis:   46  Text Interpretation: Sinus tachycardia Confirmed by Vivi Barrack [phone]) on 06/11/2023 5:16:06 PM  Radiology CT ABDOMEN PELVIS WO CONTRAST  Result Date: 06/11/2023 CLINICAL DATA:  Sepsis. EXAM: CT ABDOMEN AND PELVIS WITHOUT CONTRAST TECHNIQUE: Multidetector CT imaging of the abdomen and pelvis was performed following the standard protocol without IV contrast. RADIATION DOSE REDUCTION: This exam was performed according to the departmental dose-optimization program which includes automated exposure control, adjustment of the mA and/or kV according to patient size and/or use of iterative reconstruction technique. COMPARISON:  CT dated 03/29/2023. FINDINGS: Evaluation of this exam is limited in the absence of intravenous contrast. Lower chest: Minimal bibasilar dependent atelectasis. There is coronary vascular calcification. No intra-abdominal free air or free fluid. Hepatobiliary: The liver is unremarkable. No BD dilatation. The gallbladder is unremarkable. Pancreas: The distal body and tail of the pancreas are atrophic. There is a focus of coarse calcification in the head and uncinate process of the pancreas similar to prior CT, likely sequela of chronic pancreatitis. No active inflammatory changes. Spleen: Normal in size without focal abnormality. Adrenals/Urinary Tract: The adrenal glands are unremarkable. There is mild bilateral hydronephrosis, new since the prior CT. No stone. There is diffuse thickened and trabeculated appearance of the bladder wall indicative of chronic bladder outlet obstruction. Correlation with urinalysis recommended to exclude superimposed UTI. Stomach/Bowel:  Mild sigmoid diverticulosis without active inflammatory changes. There is no bowel obstruction or active inflammation. The appendix is normal. Vascular/Lymphatic: Moderate aortoiliac atherosclerotic disease. The IVC is unremarkable. No portal venous gas. There is no adenopathy. Reproductive: Enlarged prostate gland with median lobe hypertrophy protruding through the base of the bladder. Other: None Musculoskeletal: Degenerative changes.  No acute osseous pathology. IMPRESSION: 1. Mild bilateral hydronephrosis, new since the prior CT, likely related to chronic bladder outlet obstruction. No stone. 2. Enlarged prostate gland with findings of chronic bladder outlet obstruction. 3. Mild sigmoid diverticulosis. No bowel obstruction. Normal appendix. 4.  Aortic Atherosclerosis (ICD10-I70.0). Electronically Signed   By: Elgie Collard M.D.   On: 06/11/2023 18:56   CT Head Wo Contrast  Result Date: 06/11/2023 CLINICAL DATA:  Mental status change, unknown cause EXAM: CT HEAD WITHOUT CONTRAST TECHNIQUE: Contiguous axial images were obtained from the base of the skull through the vertex without intravenous contrast. RADIATION DOSE REDUCTION: This exam was performed according to the departmental dose-optimization program which includes automated exposure control, adjustment  of the mA and/or kV according to patient size and/or use of iterative reconstruction technique. COMPARISON:  CT head 05/17/23, Brain MRI 01/02/22 FINDINGS: Brain: No hemorrhage. No extra-axial fluid collection. There is a peripheral 1.3 x 0.7 cm hyperdense lesion along the right cerebral convexity (series 2, image 20). This was likely present on prior imaging. This was seen on prior brain MRI dated 01/02/2022, and favored to represent a small meningioma. Redemonstrated vasogenic edema in the anterior right temporal lobe, and the right insula. Known lesion abutting the right cavernous sinus is poorly visualized due to CT technique. No hemorrhage. No  extra-axial fluid collection. No CT evidence of an acute cortical infarct. No midline shift. Vascular: No hyperdense vessel or unexpected calcification. Skull: Normal. Negative for fracture or focal lesion. Sinuses/Orbits: no middle ear or mastoid effusion. Paranasal sinuses are clear. Bilateral lens replacement. Orbits are otherwise unremarkable. Other: None. IMPRESSION: 1. No acute intracranial abnormality. 2. Redemonstrated vasogenic edema in the anterior right temporal lobe, and the right insula. Known lesion abutting the right cavernous sinus is poorly visualized due to CT technique. Consider further evaluation with contrast-enhanced brain MRI more definitive characterization. Electronically Signed   By: Lorenza Cambridge M.D.   On: 06/11/2023 18:07   DG Chest Port 1 View  Result Date: 06/11/2023 CLINICAL DATA:  Altered mental status. EXAM: PORTABLE CHEST 1 VIEW COMPARISON:  May 17, 2023. FINDINGS: The heart size and mediastinal contours are within normal limits. Hypoinflation of the lungs with minimal bibasilar subsegmental atelectasis. The visualized skeletal structures are unremarkable. IMPRESSION: Hypoinflation of the lungs with minimal bibasilar subsegmental atelectasis. Electronically Signed   By: Lupita Raider M.D.   On: 06/11/2023 16:39    Procedures .Critical Care  Performed by: Loetta Rough, MD Authorized by: Loetta Rough, MD   Critical care provider statement:    Critical care time (minutes):  40   Critical care was necessary to treat or prevent imminent or life-threatening deterioration of the following conditions:  Sepsis   Critical care was time spent personally by me on the following activities:  Development of treatment plan with patient or surrogate, discussions with consultants, evaluation of patient's response to treatment, examination of patient, ordering and review of laboratory studies, ordering and review of radiographic studies, ordering and performing treatments and  interventions, pulse oximetry, re-evaluation of patient's condition, review of old charts and obtaining history from patient or surrogate   Care discussed with: admitting provider       Medications Ordered in ED Medications  lactated ringers infusion ( Intravenous New Bag/Given 06/11/23 1615)  lactated ringers bolus 1,000 mL (0 mLs Intravenous Stopped 06/11/23 1741)  cefTRIAXone (ROCEPHIN) 2 g in sodium chloride 0.9 % 100 mL IVPB (0 g Intravenous Stopped 06/11/23 1741)  LORazepam (ATIVAN) injection 0.5 mg (0.5 mg Intravenous Given 06/11/23 1741)    ED Course/ Medical Decision Making/ A&P                          Medical Decision Making Amount and/or Complexity of Data Reviewed Labs: ordered. Decision-making details documented in ED Course. Radiology: ordered. Decision-making details documented in ED Course. ECG/medicine tests: ordered.  Risk Prescription drug management. Decision regarding hospitalization.    This patient presents to the ED for concern of AMS, fever; this involves an extensive number of treatment options, and is a complaint that carries with it a high risk of complications and morbidity.  I considered the following differential and admission  for this acute, potentially life threatening condition.   MDM:    Ddx of acute altered mental status or encephalopathy considered but not limited to: -Intracranial abnormalities such as ICH, hydrocephalus - does have h/o brain tumor, consider possible vasogenic edema or worsening of condition.  -Highest degree of concern for metabolic encephalopathy related to UTI as patient's family states he was recently treated for 1.  He also recently had a Foley catheter removed by urology but he does not have that currently.  He is not tachypneic or hypoxic to indicate respiratory source, no report of cough, but will also get a chest x-ray to assess for pneumonia.  He has no meningismus, moving his neck freely, lower c/f meningitis.  -Electrolyte  abnormalities or hyper/hypoglycemia -Hypercarbia or hypoxia -ACS or arrhythmia - EKG w/o signs of ischemia/arrhythmia  Patient found to have Cr up from 1.17 to 3.92. Patient with AMS/UTI, will obtain CT abd pelvis wo contrast to assess for possible obstruction/nephrolithiasis/ureterolithiasis or other intraabdominal source of infection as patient cannot provide history. He does not appear to have abdominal tenderness to palpation but does become irritated with abdominal palpation. Will give small dose of ativan to help with CT scan.    Clinical Course as of 06/11/23 1951  Fri Jun 11, 2023  1715 WBC(!): 28.3 Large leukocytosis [HN]  1715 Lactic Acid, Venous: 1.7 wnl [HN]  1715 Resp panel by RT-PCR (RSV, Flu A&B, Covid) Anterior Nasal Swab neg [HN]  1715 Hemoglobin(!): 9.4 Decreased from 11 two weeks ago [HN]  1715 Creatinine(!): 3.92 Large AKI up from 1.1 three weeks ago, receiving fluids [HN]  1852 Urinalysis, w/ Reflex to Culture (Infection Suspected) -Urine, Clean Catch(!) +UTI, urine culture sent [HN]  1853 CT Head Wo Contrast 1. No acute intracranial abnormality. 2. Redemonstrated vasogenic edema in the anterior right temporal lobe, and the right insula. Known lesion abutting the right cavernous sinus is poorly visualized due to CT technique. Consider further evaluation with contrast-enhanced brain MRI more definitive characterization.   [HN]  1853 DG Chest Port 1 View Hypoinflation of the lungs with minimal bibasilar subsegmental atelectasis.   [HN]  1933 CT ABDOMEN PELVIS WO CONTRAST 1. Mild bilateral hydronephrosis, new since the prior CT, likely related to chronic bladder outlet obstruction. No stone. 2. Enlarged prostate gland with findings of chronic bladder outlet obstruction. 3. Mild sigmoid diverticulosis. No bowel obstruction. Normal appendix. 4.  Aortic Atherosclerosis (ICD10-I70.0).   [HN]  1933 Chronic bladder outlet obstruction. Will get bladder scan.   [HN]  1951 Admitted to hospitalist [HN]    Clinical Course User Index [HN] Loetta Rough, MD    Labs: I Ordered, and personally interpreted labs.  The pertinent results include:  those listed above  Imaging Studies ordered: I ordered imaging studies including CTH, CXR, CT abd pelvis wo contrast I independently visualized and interpreted imaging. I agree with the radiologist interpretation  Additional history obtained from family at bedside, chart review.    Cardiac Monitoring: The patient was maintained on a cardiac monitor.  I personally viewed and interpreted the cardiac monitored which showed an underlying rhythm of: sinus tachycardia  Reevaluation: After the interventions noted above, I reevaluated the patient and found that they have :improved  Social Determinants of Health: Lives with family  Disposition:  Admit to hospitalist  Co morbidities that complicate the patient evaluation  Past Medical History:  Diagnosis Date   Blood clots in brain    Tumors    in head  Medicines Meds ordered this encounter  Medications   lactated ringers infusion   lactated ringers bolus 1,000 mL    Order Specific Question:   Reason 30 mL/kg dose is not being ordered    Answer:   First Lactic Acid Pending   DISCONTD: cefTRIAXone (ROCEPHIN) 1 g in sodium chloride 0.9 % 100 mL IVPB    Order Specific Question:   Antibiotic Indication:    Answer:   UTI   cefTRIAXone (ROCEPHIN) 2 g in sodium chloride 0.9 % 100 mL IVPB    Order Specific Question:   Antibiotic Indication:    Answer:   UTI   LORazepam (ATIVAN) injection 0.5 mg    I have reviewed the patients home medicines and have made adjustments as needed  Problem List / ED Course: Problem List Items Addressed This Visit   None Visit Diagnoses     Altered mental status, unspecified altered mental status type    -  Primary   AKI (acute kidney injury) (HCC)       Acute cystitis without hematuria                        This note was created using dictation software, which may contain spelling or grammatical errors.    Loetta Rough, MD 06/11/23 [phone]

## 2023-06-12 DIAGNOSIS — N179 Acute kidney failure, unspecified: Secondary | ICD-10-CM | POA: Diagnosis not present

## 2023-06-12 DIAGNOSIS — A419 Sepsis, unspecified organism: Secondary | ICD-10-CM

## 2023-06-12 DIAGNOSIS — D329 Benign neoplasm of meninges, unspecified: Secondary | ICD-10-CM

## 2023-06-12 DIAGNOSIS — R569 Unspecified convulsions: Secondary | ICD-10-CM

## 2023-06-12 DIAGNOSIS — N1330 Unspecified hydronephrosis: Secondary | ICD-10-CM

## 2023-06-12 DIAGNOSIS — G9341 Metabolic encephalopathy: Secondary | ICD-10-CM | POA: Diagnosis not present

## 2023-06-12 DIAGNOSIS — N390 Urinary tract infection, site not specified: Secondary | ICD-10-CM

## 2023-06-12 DIAGNOSIS — E872 Acidosis, unspecified: Secondary | ICD-10-CM

## 2023-06-12 MED ORDER — LORAZEPAM 2 MG/ML IJ SOLN
0.5000 mg | Freq: Once | INTRAMUSCULAR | Status: AC
  Administered 2023-06-12: 0.5 mg via INTRAVENOUS
  Filled 2023-06-12: qty 1

## 2023-06-12 MED ORDER — LACTATED RINGERS IV SOLN: INTRAVENOUS | Status: DC

## 2023-06-12 NOTE — Progress Notes (Signed)
PROGRESS NOTE    Aaron Hickman  WJX:[id number] DOB: January 16, 1936 DOA: 06/11/2023 PCP: [facility]   Brief Narrative:  The patient is a 87 year old Caucasian male with past medical history significant for but limited to medial sphenoid meningioma status postradiation, chronic vasogenic edema who has been noncompliant with steroids, seizures, BPH as well as other comorbidities who presented with altered mental status.  Patient was arousable to light noxious stimuli on the time of admission and would push away the admitted hand but otherwise remained asleep.  Admitted spoke with the wife over the phone who provided limited history and she stated for last few days he has been sleepy and not wanting to do anything.  Normally does everything on his own has not been eating or drinking or taking any medications including his antiepileptics.  He had a urinary tract infection that was treated 2 weeks ago and he has a history of urinary retention requiring Foley catheter but the wife thinks that he was frequently urinating.  He came to the ED and was found to be febrile, tachycardic and a leukocytosis of 28.3.  Also had a metabolic acidosis and UA is grossly positive for moderate leukocytes positive nitrites and many bacteria.  He was started on fluids and antibiotics and admitted for sepsis and also found to have a gram-negative bacteremia and UTI with sensitivities pending.  Assessment and Plan:  Sepsis secondary to UTI (HCC) and Gram-Negative Bacteremia -UA grossly positive and presented with fever, tachycardia and leukocytosis -Urinalysis showed large hemoglobin, moderate leukocytes, positive nitrites, many bacteria, greater than 50 RBCs per high-power field, greater than 50 WBCs with 0-5 squamous epithelial cells -Blood cultures x 2 obtained and showing gram-negative bacteremia -Urine cultures obtained and showing greater than 100,000 colony forming units of gram-negative  rods -Continue with antibiotics with IV ceftriaxone and escalate IV cefepime based on sensitivities if necessary -Continue with IV fluid hydration as below -Lactic Acid Level Trend:  Recent Labs  Lab 06/11/23 1614  LATICACIDVEN 1.7  -WBC Trend: Recent Labs  Lab 05/17/23 1300 05/21/23 1430 06/11/23 1605 06/12/23 0552  WBC 10.2 14.6* 28.3* 25.3*  -Repeat CBC in a.m. -Will need PT/OT to evaluate when improved   Acute Metabolic Encephalopathy -Secondary to sepsis from urinary tract infection and bacteremia -Continue to monitor with treatment above. Normally is responsive and independent on all ADLS.  -CT scan as below -Remains significantly confused currently and if not improving with treatment of his infection we will obtain a head imaging and discussion with neurology -Placed on delirium precautions   Bilateral Hydronephrosis -Likely secondary to chronic outlet obstruction with enlarged prostate -No retention on bladder scan  -CT Scan of the Abd/Pelvis done and showed "Mild bilateral hydronephrosis, new since the prior CT, likely related to chronic bladder outlet obstruction. No stone. Enlarged prostate gland with findings of chronic bladder outlet obstruction. Mild sigmoid diverticulosis. No bowel obstruction. Normal appendix. Aortic Atherosclerosis " -Had a Foley catheter placed   AKI/Acute Renal Failure (ARF) (HCC) -BUN/Cr Trend: Recent Labs  Lab 05/17/23 1300 05/21/23 1430 06/11/23 1605 06/12/23 0552  BUN 15 28* 66* 57*  CREATININE 1.10 1.17 3.92* 2.06*  -Baseline appears to be around 1.1 -Renewed IV fluid hydration with lactated Ringer's but now reduce rate to 75 mL/h for another 20 hours -Foley catheter has been placed -Avoid Nephrotoxic Medications, Contrast Dyes, Hypotension and Dehydration to Ensure Adequate Renal Perfusion and will need to Renally Adjust Meds -Continue to Monitor and Trend Renal Function carefully  and repeat CMP in the AM    Metabolic Acidosis,  normal anion gap (NAG) -Secondary to dehydration and acute renal failure -Continue IV fluid and renewed the order but reduce the rate from 150 mL/h to 75 mL/h for another 24 hours -Patient's CO2 is now 19, anion gap is 9, chloride level is 116 -Continue monitor trend and repeat CMP in the a.m.  Thrombocytopenia -In the setting of infection and sepsis -Patient's platelet count  Trend: Recent Labs  Lab 05/17/23 1300 05/21/23 1430 06/11/23 1605 06/12/23 0552  PLT 435* 531* 139* 117*  -Continue to monitor and trend and repeat CBC in a.m.  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 Redemonstrated vasogenic edema in the anterior right temporal lobe, and the right insula. Known lesion abutting the right cavernous sinus is poorly visualized due to CT technique. Consider further evaluation with contrast-enhanced brain MRI more definitive characterization."  Hypoalbuminemia -Patient's Albumin Trend: Recent Labs  Lab 05/17/23 1300 05/21/23 1554 06/11/23 1605  ALBUMIN 2.8* 3.2* 2.6*  -Continue to Monitor and Trend and repeat CMP in the AM   DVT prophylaxis: enoxaparin (LOVENOX) injection 30 mg Start: 06/12/23 1000    Code Status: Full Code Family Communication: No family present at bedside  Disposition Plan:  Level of care:  Telemetry Status is: Inpatient Remains inpatient appropriate because: Needs further clinical improvement and improvement in his bacteremia and UTI   Consultants:  None  Procedures:  As delineated as above  Antimicrobials:  Anti-infectives (From admission, onward)    Start     Dose/Rate Route Frequency Ordered Stop   06/12/23 1600  cefTRIAXone (ROCEPHIN) 2 g in sodium chloride 0.9 % 100 mL IVPB        2 g 200 mL/hr over 30 Minutes Intravenous Every 24 hours 06/11/23 2104     06/11/23 1615  cefTRIAXone (ROCEPHIN) 1 g in sodium chloride 0.9 % 100 mL IVPB  Status:  Discontinued        1 g 200 mL/hr over 30 Minutes Intravenous Every 24 hours 06/11/23 1603 06/11/23 1608   06/11/23 1615  cefTRIAXone (ROCEPHIN) 2 g in sodium chloride 0.9 % 100 mL IVPB        2 g 200 mL/hr over 30 Minutes Intravenous  Once 06/11/23 1608 06/11/23 1741       Subjective: Seen and examined at bedside and remains very confused and was a little agitated.  Denied any complaints of pain.  No nausea or vomiting.  No other concerns or complaints this time but wanted to leave.  Objective: Vitals:   06/12/23 0900 06/12/23 0902 06/12/23 1303 06/12/23 1304  BP:  122/68 129/69   Pulse:  74 73   Resp:  18 20   Temp: 98.3 F (36.8 C)   98.3 F (36.8 C)  TempSrc: Oral   Oral  SpO2:  98% 99%   Weight:  69.9 kg    Height:  5\' 10"  (1.778 m)      Intake/Output Summary (Last 24 hours) at 06/12/2023 1600 Last data filed at 06/12/2023 1544 Gross per 24 hour  Intake 4155.91 ml  Output 2100 ml  Net 2055.91 ml   Filed Weights   06/12/23 0902  Weight: 69.9 kg   Examination: Physical Exam:  Constitutional: Thin chronically ill-appearing Caucasian male who appears little agitated and remains confused Respiratory: Diminished to auscultation bilaterally, no wheezing, rales, rhonchi or crackles. Normal respiratory effort and patient is not tachypenic. No accessory muscle use.  Unlabored breathing Cardiovascular: RRR,  no murmurs / rubs / gallops. S1 and S2 auscultated. No extremity edema.  Abdomen: Soft, non-tender, non-distended. Bowel sounds positive.  GU: Deferred. Musculoskeletal: No clubbing / cyanosis of digits/nails. No joint deformity upper and lower extremities.  Skin: No rashes, lesions, ulcers limited skin evaluation. No induration; Warm and dry.  Neurologic: Cranial nerves II through XII gross intact but remains extremely confused Psychiatric: Impaired judgment and insight.  He is awake but not fully alert and oriented  Data Reviewed: I have personally reviewed following labs and imaging studies  CBC: Recent Labs  Lab 06/11/23 1605 06/12/23 0552  WBC 28.3* 25.3*  NEUTROABS 23.4*  --   HGB 9.4* 9.1*  HCT 29.3* 28.5*  MCV 97.0 98.3  PLT 139* 117*   Basic Metabolic Panel: Recent Labs  Lab 06/11/23 1605 06/12/23 0552  NA 141 144  K 4.1 3.9  CL 113* 116*  CO2 17* 19*  GLUCOSE 119* 104*  BUN 66* 57*  CREATININE 3.92* 2.06*  CALCIUM 8.3* 8.1*   GFR: Estimated Creatinine Clearance: 26.4 mL/min (A) (by C-G formula based on SCr of 2.06 mg/dL (H)). Liver Function Tests: Recent Labs  Lab 06/11/23 1605  AST 30  ALT 17  ALKPHOS 64  BILITOT 0.8  PROT 5.3*  ALBUMIN 2.6*   No results for input(s): "LIPASE", "AMYLASE" in the last 168 hours. No results for input(s): "AMMONIA" in the last 168 hours. Coagulation Profile: Recent Labs  Lab 06/11/23 1605  INR 1.7*   Cardiac Enzymes: No results for input(s): "CKTOTAL", "CKMB", "CKMBINDEX", "TROPONINI" in the last 168 hours. BNP (last 3 results) No results for input(s): "PROBNP" in the last 8760 hours. HbA1C: No results for input(s): "HGBA1C" in the last 72 hours. CBG: Recent Labs  Lab 06/11/23 1600  GLUCAP 113*   Lipid Profile: No results for input(s): "CHOL", "HDL", "LDLCALC", "TRIG", "CHOLHDL", "LDLDIRECT" in the last 72 hours. Thyroid Function Tests: No results for input(s): "TSH", "T4TOTAL", "FREET4", "T3FREE",  "THYROIDAB" in the last 72 hours. Anemia Panel: No results for input(s): "VITAMINB12", "FOLATE", "FERRITIN", "TIBC", "IRON", "RETICCTPCT" in the last 72 hours. Sepsis Labs: Recent Labs  Lab 06/11/23 1614  LATICACIDVEN 1.7    Recent Results (from the past 240 hour(s))  Resp panel by RT-PCR (RSV, Flu A&B, Covid) Anterior Nasal Swab     Status: None   Collection Time: 06/11/23  4:05 PM   Specimen: Anterior Nasal Swab  Result Value Ref Range Status   SARS Coronavirus 2 by RT PCR NEGATIVE NEGATIVE Final    Comment: (NOTE) SARS-CoV-2 target nucleic acids are NOT DETECTED.  The SARS-CoV-2 RNA is generally detectable in upper respiratory specimens during the acute phase of infection. The lowest concentration of SARS-CoV-2 viral copies this assay can detect is 138 copies/mL. A negative result does not preclude SARS-Cov-2 infection and should not be used as  the sole basis for treatment or other patient management decisions. A negative result may occur with  improper specimen collection/handling, submission of specimen other than nasopharyngeal swab, presence of viral mutation(s) within the areas targeted by this assay, and inadequate number of viral copies(<138 copies/mL). A negative result must be combined with clinical observations, patient history, and epidemiological information. The expected result is Negative.  Fact Sheet for Patients:  BloggerCourse.com  Fact Sheet for Healthcare Providers:  SeriousBroker.it  This test is no t yet approved or cleared by the [place] FDA and  has been authorized for detection and/or diagnosis of SARS-CoV-2 by FDA under an Emergency Use Authorization (EUA). This EUA will remain  in effect (meaning this test can be used) for the duration of the COVID-19 declaration under Section 564(b)(1) of the Act, 21 U.S.C.section 360bbb-3(b)(1), unless the authorization is terminated  or revoked sooner.        Influenza A by PCR NEGATIVE NEGATIVE Final   Influenza B by PCR NEGATIVE NEGATIVE Final    Comment: (NOTE) The Xpert Xpress SARS-CoV-2/FLU/RSV plus assay is intended as an aid in the diagnosis of influenza from Nasopharyngeal swab specimens and should not be used as a sole basis for treatment. Nasal washings and aspirates are unacceptable for Xpert Xpress SARS-CoV-2/FLU/RSV testing.  Fact Sheet for Patients: BloggerCourse.com  Fact Sheet for Healthcare Providers: SeriousBroker.it  This test is not yet approved or cleared by the [place] FDA and has been authorized for detection and/or diagnosis of SARS-CoV-2 by FDA under an Emergency Use Authorization (EUA). This EUA will remain in effect (meaning this test can be used) for the duration of the COVID-19 declaration under Section 564(b)(1) of the Act, 21 U.S.C. section 360bbb-3(b)(1), unless the authorization is terminated or revoked.     Resp Syncytial Virus by PCR NEGATIVE NEGATIVE Final    Comment: (NOTE) Fact Sheet for Patients: BloggerCourse.com  Fact Sheet for Healthcare Providers: SeriousBroker.it  This test is not yet approved or cleared by the [place] FDA and has been authorized for detection and/or diagnosis of SARS-CoV-2 by FDA under an Emergency Use Authorization (EUA). This EUA will remain in effect (meaning this test can be used) for the duration of the COVID-19 declaration under Section 564(b)(1) of the Act, 21 U.S.C. section 360bbb-3(b)(1), unless the authorization is terminated or revoked.  Performed at [facility], [address] [place], [place] [address]   Blood Culture (routine x 2)     Status: None (Preliminary result)   Collection Time: 06/11/23  4:05 PM   Specimen: BLOOD  Result Value Ref Range Status   Specimen Description   Final    BLOOD SITE NOT  SPECIFIED Performed at [facility], [address], [place], [place] [address]    Special Requests   Final    BOTTLES DRAWN AEROBIC AND ANAEROBIC Blood Culture adequate volume Performed at [facility], [address] [place], [place] [address]    Culture  Setup Time   Final    GRAM NEGATIVE RODS IN BOTH AEROBIC AND ANAEROBIC BOTTLES CRITICAL RESULT CALLED TO, READ BACK BY AND VERIFIED WITH: PHARMD ANH PHAM [phone] [id number] BY Berline [place], [place] Performed at [facility], [address] [place], [place] [address]    Culture GRAM NEGATIVE RODS  Final   Report Status PENDING  Incomplete  Blood Culture ID Panel (Reflexed)     Status: Abnormal   Collection Time: 06/11/23  4:05 PM  Result Value  Ref Range Status   Enterococcus faecalis NOT DETECTED NOT DETECTED Final   Enterococcus Faecium NOT DETECTED NOT DETECTED Final   Listeria monocytogenes NOT DETECTED NOT DETECTED Final   Staphylococcus species NOT DETECTED NOT DETECTED Final   Staphylococcus aureus (BCID) NOT DETECTED NOT DETECTED Final   Staphylococcus epidermidis NOT DETECTED NOT DETECTED Final   Staphylococcus lugdunensis NOT DETECTED NOT DETECTED Final   Streptococcus species NOT DETECTED NOT DETECTED Final   Streptococcus agalactiae NOT DETECTED NOT DETECTED Final   Streptococcus pneumoniae NOT DETECTED NOT DETECTED Final   Streptococcus pyogenes NOT DETECTED NOT DETECTED Final   A.calcoaceticus-baumannii NOT DETECTED NOT DETECTED Final   Bacteroides fragilis NOT DETECTED NOT DETECTED Final   Enterobacterales DETECTED (A) NOT DETECTED Final    Comment: Enterobacterales represent a large order of gram negative bacteria, not a single organism. Refer to culture for further identification. CRITICAL RESULT CALLED TO, READ BACK BY AND VERIFIED WITH: PHARMD ANH PHAM [phone] [phone] BY J [id number], MT    Enterobacter cloacae complex NOT DETECTED NOT DETECTED Final   Escherichia coli NOT DETECTED  NOT DETECTED Final   Klebsiella aerogenes NOT DETECTED NOT DETECTED Final   Klebsiella oxytoca NOT DETECTED NOT DETECTED Final   Klebsiella pneumoniae NOT DETECTED NOT DETECTED Final   Proteus species NOT DETECTED NOT DETECTED Final   Salmonella species NOT DETECTED NOT DETECTED Final   Serratia marcescens NOT DETECTED NOT DETECTED Final   Haemophilus influenzae NOT DETECTED NOT DETECTED Final   Neisseria meningitidis NOT DETECTED NOT DETECTED Final   Pseudomonas aeruginosa NOT DETECTED NOT DETECTED Final   Stenotrophomonas maltophilia NOT DETECTED NOT DETECTED Final   Candida albicans NOT DETECTED NOT DETECTED Final   Candida auris NOT DETECTED NOT DETECTED Final   Candida glabrata NOT DETECTED NOT DETECTED Final   Candida krusei NOT DETECTED NOT DETECTED Final   Candida parapsilosis NOT DETECTED NOT DETECTED Final   Candida tropicalis NOT DETECTED NOT DETECTED Final   Cryptococcus neoformans/gattii NOT DETECTED NOT DETECTED Final   CTX-M ESBL NOT DETECTED NOT DETECTED Final   Carbapenem resistance IMP NOT DETECTED NOT DETECTED Final   Carbapenem resistance KPC NOT DETECTED NOT DETECTED Final   Carbapenem resistance NDM NOT DETECTED NOT DETECTED Final   Carbapenem resist OXA 48 LIKE NOT DETECTED NOT DETECTED Final   Carbapenem resistance VIM NOT DETECTED NOT DETECTED Final    Comment: Performed at [facility], [address], [place], [place] [address]  Blood Culture (routine x 2)     Status: None (Preliminary result)   Collection Time: 06/11/23  4:08 PM   Specimen: BLOOD LEFT FOREARM  Result Value Ref Range Status   Specimen Description   Final    BLOOD LEFT FOREARM Performed at [facility], [address] [place], [place] [address]    Special Requests   Final    BOTTLES DRAWN AEROBIC AND ANAEROBIC Blood Culture adequate volume Performed at [facility], [address], [place], [place] [address]    Culture   Final    NO GROWTH < 24  HOURS Performed at [facility], [address] [place], [place] [address]    Report Status PENDING  Incomplete  Urine Culture     Status: Abnormal (Preliminary result)   Collection Time: 06/11/23  6:14 PM   Specimen: Urine, Random  Result Value Ref Range Status   Specimen Description   Final    URINE, RANDOM Performed at [facility],  [address], [place], [place] [address]    Special Requests   Final    NONE Reflexed from [id number] Performed at [facility], [address] [place], [place] [address]    Culture >=100,000 COLONIES/mL GRAM NEGATIVE RODS (A)  Final   Report Status PENDING  Incomplete    Radiology Studies: CT ABDOMEN PELVIS WO CONTRAST  Result Date: 06/11/2023 CLINICAL DATA:  Sepsis. EXAM: CT ABDOMEN AND PELVIS WITHOUT CONTRAST TECHNIQUE: Multidetector CT imaging of the abdomen and pelvis was performed following the standard protocol without IV contrast. RADIATION DOSE REDUCTION: This exam was performed according to the departmental dose-optimization program which includes automated exposure control, adjustment of the mA and/or kV according to patient size and/or use of iterative reconstruction technique. COMPARISON:  CT dated 03/29/2023. FINDINGS: Evaluation of this exam is limited in the absence of intravenous contrast. Lower chest: Minimal bibasilar dependent atelectasis. There is coronary vascular calcification. No intra-abdominal free air or free fluid. Hepatobiliary: The liver is unremarkable. No BD dilatation. The gallbladder is unremarkable. Pancreas: The distal body and tail of the pancreas are atrophic. There is a focus of coarse calcification in the head and uncinate process of the pancreas similar to prior CT, likely sequela of chronic pancreatitis. No active inflammatory changes. Spleen: Normal in size without focal abnormality. Adrenals/Urinary Tract: The adrenal glands are unremarkable. There is mild bilateral  hydronephrosis, new since the prior CT. No stone. There is diffuse thickened and trabeculated appearance of the bladder wall indicative of chronic bladder outlet obstruction. Correlation with urinalysis recommended to exclude superimposed UTI. Stomach/Bowel: Mild sigmoid diverticulosis without active inflammatory changes. There is no bowel obstruction or active inflammation. The appendix is normal. Vascular/Lymphatic: Moderate aortoiliac atherosclerotic disease. The IVC is unremarkable. No portal venous gas. There is no adenopathy. Reproductive: Enlarged prostate gland with median lobe hypertrophy protruding through the base of the bladder. Other: None Musculoskeletal: Degenerative changes.  No acute osseous pathology. IMPRESSION: 1. Mild bilateral hydronephrosis, new since the prior CT, likely related to chronic bladder outlet obstruction. No stone. 2. Enlarged prostate gland with findings of chronic bladder outlet obstruction. 3. Mild sigmoid diverticulosis. No bowel obstruction. Normal appendix. 4.  Aortic Atherosclerosis (ICD10-I70.0). Electronically Signed   By: Elgie Collard M.D.   On: 06/11/2023 18:56   CT Head Wo Contrast  Result Date: 06/11/2023 CLINICAL DATA:  Mental status change, unknown cause EXAM: CT HEAD WITHOUT CONTRAST TECHNIQUE: Contiguous axial images were obtained from the base of the skull through the vertex without intravenous contrast. RADIATION DOSE REDUCTION: This exam was performed according to the departmental dose-optimization program which includes automated exposure control, adjustment of the mA and/or kV according to patient size and/or use of iterative reconstruction technique. COMPARISON:  CT head 05/17/23, Brain MRI 01/02/22 FINDINGS: Brain: No hemorrhage. No extra-axial fluid collection. There is a peripheral 1.3 x 0.7 cm hyperdense lesion along the right cerebral convexity (series 2, image 20). This was likely present on prior imaging. This was seen on prior brain MRI dated  01/02/2022, and favored to represent a small meningioma. Redemonstrated vasogenic edema in the anterior right temporal lobe, and the right insula. Known lesion abutting the right cavernous sinus is poorly visualized due to CT technique. No hemorrhage. No extra-axial fluid collection. No CT evidence of an acute cortical infarct. No midline shift. Vascular: No hyperdense vessel or unexpected calcification. Skull: Normal. Negative for fracture or focal lesion. Sinuses/Orbits: no middle ear or mastoid effusion. Paranasal sinuses are clear. Bilateral lens replacement. Orbits are  otherwise unremarkable. Other: None. IMPRESSION: 1. No acute intracranial abnormality. 2. Redemonstrated vasogenic edema in the anterior right temporal lobe, and the right insula. Known lesion abutting the right cavernous sinus is poorly visualized due to CT technique. Consider further evaluation with contrast-enhanced brain MRI more definitive characterization. Electronically Signed   By: Lorenza Cambridge M.D.   On: 06/11/2023 18:07   DG Chest Port 1 View  Result Date: 06/11/2023 CLINICAL DATA:  Altered mental status. EXAM: PORTABLE CHEST 1 VIEW COMPARISON:  May 17, 2023. FINDINGS: The heart size and mediastinal contours are within normal limits. Hypoinflation of the lungs with minimal bibasilar subsegmental atelectasis. The visualized skeletal structures are unremarkable. IMPRESSION: Hypoinflation of the lungs with minimal bibasilar subsegmental atelectasis. Electronically Signed   By: Lupita Raider M.D.   On: 06/11/2023 16:39    Scheduled Meds:  enoxaparin (LOVENOX) injection  30 mg Subcutaneous Q24H   levothyroxine  50 mcg Oral Q0600   sodium chloride  1 g Oral BID WC   Continuous Infusions:  cefTRIAXone (ROCEPHIN)  IV 2 g (06/12/23 1544)   lactated ringers     levETIRAcetam Stopped (06/11/23 2259)    LOS: 1 day   Marguerita Merles, DO [facility] Available via Epic secure chat 7am-7pm After these hours, please refer to  coverage provider listed on [email or web address] 06/12/2023, 4:00 PM

## 2023-06-12 NOTE — Hospital Course (Signed)
The patient is a 87 year old Caucasian male with past medical history significant for but limited to medial sphenoid meningioma status postradiation, chronic vasogenic edema who has been noncompliant with steroids, seizures, BPH as well as other comorbidities who presented with altered mental status.  Patient was arousable to light noxious stimuli on the time of admission and would push away the admitted hand but otherwise remained asleep.  Admitted spoke with the wife over the phone who provided limited history and she stated for last few days he has been sleepy and not wanting to do anything.  Normally does everything on his own has not been eating or drinking or taking any medications including his antiepileptics.  He had a urinary tract infection that was treated 2 weeks ago and he has a history of urinary retention requiring Foley catheter but the wife thinks that he was frequently urinating.  He came to the ED and was found to be febrile, tachycardic and a leukocytosis of 28.3.  Also had a metabolic acidosis and UA is grossly positive for moderate leukocytes positive nitrites and many bacteria.  He was started on fluids and antibiotics and admitted for sepsis and also found to have a gram-negative bacteremia and UTI with sensitivities pending.  Assessment and Plan:  Sepsis secondary to UTI (HCC) and Gram-Negative Bacteremia -UA grossly positive and presented with fever, tachycardia and leukocytosis -Urinalysis showed large hemoglobin, moderate leukocytes, positive nitrites, many bacteria, greater than 50 RBCs per high-power field, greater than 50 WBCs with 0-5 squamous epithelial cells -Blood cultures x 2 obtained and showing gram-negative bacteremia -Urine cultures obtained and showing greater than 100,000 colony forming units of gram-negative rods -Continue with antibiotics with IV ceftriaxone and escalate IV cefepime based on sensitivities if necessary -Continue with IV fluid hydration as  below -Lactic Acid Level Trend:  Recent Labs  Lab 06/11/23 1614  LATICACIDVEN 1.7  -WBC Trend: Recent Labs  Lab 05/17/23 1300 05/21/23 1430 06/11/23 1605 06/12/23 0552  WBC 10.2 14.6* 28.3* 25.3*  -Repeat CBC in a.m. -Will need PT/OT to evaluate when improved   Acute Metabolic Encephalopathy -Secondary to sepsis from urinary tract infection and bacteremia -Continue to monitor with treatment above. Normally is responsive and independent on all ADLS.  -CT scan as below -Remains significantly confused currently and if not improving with treatment of his infection we will obtain a head imaging and discussion with neurology -Placed on delirium precautions   Bilateral Hydronephrosis -Likely secondary to chronic outlet obstruction with enlarged prostate -No retention on bladder scan  -CT Scan of the Abd/Pelvis done and showed "Mild bilateral hydronephrosis, new since the prior CT, likely related to chronic bladder outlet obstruction. No stone. Enlarged prostate gland with findings of chronic bladder outlet obstruction. Mild sigmoid diverticulosis. No bowel obstruction. Normal appendix. Aortic Atherosclerosis " -Had a Foley catheter placed   AKI/Acute Renal Failure (ARF) (HCC) -BUN/Cr Trend: Recent Labs  Lab 05/17/23 1300 05/21/23 1430 06/11/23 1605 06/12/23 0552  BUN 15 28* 66* 57*  CREATININE 1.10 1.17 3.92* 2.06*  -Baseline appears to be around 1.1 -Renewed IV fluid hydration with lactated Ringer's but now reduce rate to 75 mL/h for another 20 hours -Foley catheter has been placed -Avoid Nephrotoxic Medications, Contrast Dyes, Hypotension and Dehydration to Ensure Adequate Renal Perfusion and will need to Renally Adjust Meds -Continue to Monitor and Trend Renal Function carefully and repeat CMP in the AM    Metabolic Acidosis, normal anion gap (NAG) -Secondary to dehydration and acute renal failure -Continue IV fluid  and renewed the order but reduce the rate from 150 mL/h  to 75 mL/h for another 24 hours -Patient's CO2 is now 19, anion gap is 9, chloride level is 116 -Continue monitor trend and repeat CMP in the a.m.  Thrombocytopenia -In the setting of infection and sepsis -Patient's platelet count  Trend: Recent Labs  Lab 05/17/23 1300 05/21/23 1430 06/11/23 1605 06/12/23 0552  PLT 435* 531* 139* 117*  -Continue to monitor and trend and repeat CBC in a.m.  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 Redemonstrated vasogenic edema in the anterior right temporal lobe, and the right insula. Known lesion abutting the right cavernous sinus is poorly visualized due to CT technique. Consider further evaluation with contrast-enhanced brain MRI more definitive characterization."  Hypoalbuminemia -Patient's Albumin Trend: Recent Labs  Lab 05/17/23 1300 05/21/23 1554 06/11/23 1605  ALBUMIN 2.8* 3.2* 2.6*  -Continue to Monitor and Trend and repeat CMP in the AM

## 2023-06-12 NOTE — Progress Notes (Signed)
PHARMACY - PHYSICIAN COMMUNICATION CRITICAL VALUE ALERT - BLOOD CULTURE IDENTIFICATION (BCID)  Aaron Hickman is an 87 y.o. male who presented to [facility] on 06/11/2023 with a chief complaint of AMS. She was started on ceftriaxone on admission for suspected sepsis secondary to UTI. Two of four blood culture bottles collected on 06/11/23 resulted back with GNR (BCID= enterobacterales - no subspecies identified).   Name of physician (or Provider) Contacted: Dr. Marland Mcalpine  Current antibiotics: ceftriaxone 2gm q24h  Changes to prescribed antibiotics recommended:  - continue ceftriaxone 2gm q24h  Results for orders placed or performed during the hospital encounter of 06/11/23  Blood Culture ID Panel (Reflexed) (Collected: 06/11/2023  4:05 PM)  Result Value Ref Range   Enterococcus faecalis NOT DETECTED NOT DETECTED   Enterococcus Faecium NOT DETECTED NOT DETECTED   Listeria monocytogenes NOT DETECTED NOT DETECTED   Staphylococcus species NOT DETECTED NOT DETECTED   Staphylococcus aureus (BCID) NOT DETECTED NOT DETECTED   Staphylococcus epidermidis NOT DETECTED NOT DETECTED   Staphylococcus lugdunensis NOT DETECTED NOT DETECTED   Streptococcus species NOT DETECTED NOT DETECTED   Streptococcus agalactiae NOT DETECTED NOT DETECTED   Streptococcus pneumoniae NOT DETECTED NOT DETECTED   Streptococcus pyogenes NOT DETECTED NOT DETECTED   A.calcoaceticus-baumannii NOT DETECTED NOT DETECTED   Bacteroides fragilis NOT DETECTED NOT DETECTED   Enterobacterales DETECTED (A) NOT DETECTED   Enterobacter cloacae complex NOT DETECTED NOT DETECTED   Escherichia coli NOT DETECTED NOT DETECTED   Klebsiella aerogenes NOT DETECTED NOT DETECTED   Klebsiella oxytoca NOT DETECTED NOT DETECTED   Klebsiella pneumoniae NOT DETECTED NOT DETECTED   Proteus species NOT DETECTED NOT DETECTED   Salmonella species NOT DETECTED NOT DETECTED   Serratia marcescens NOT DETECTED NOT DETECTED   Haemophilus influenzae NOT  DETECTED NOT DETECTED   Neisseria meningitidis NOT DETECTED NOT DETECTED   Pseudomonas aeruginosa NOT DETECTED NOT DETECTED   Stenotrophomonas maltophilia NOT DETECTED NOT DETECTED   Candida albicans NOT DETECTED NOT DETECTED   Candida auris NOT DETECTED NOT DETECTED   Candida glabrata NOT DETECTED NOT DETECTED   Candida krusei NOT DETECTED NOT DETECTED   Candida parapsilosis NOT DETECTED NOT DETECTED   Candida tropicalis NOT DETECTED NOT DETECTED   Cryptococcus neoformans/gattii NOT DETECTED NOT DETECTED   CTX-M ESBL NOT DETECTED NOT DETECTED   Carbapenem resistance IMP NOT DETECTED NOT DETECTED   Carbapenem resistance KPC NOT DETECTED NOT DETECTED   Carbapenem resistance NDM NOT DETECTED NOT DETECTED   Carbapenem resist OXA 48 LIKE NOT DETECTED NOT DETECTED   Carbapenem resistance VIM NOT DETECTED NOT DETECTED    ,  P 06/12/2023  8:45 AM

## 2023-06-13 DIAGNOSIS — R451 Restlessness and agitation: Secondary | ICD-10-CM

## 2023-06-13 DIAGNOSIS — N179 Acute kidney failure, unspecified: Secondary | ICD-10-CM | POA: Diagnosis not present

## 2023-06-13 DIAGNOSIS — A498 Other bacterial infections of unspecified site: Secondary | ICD-10-CM

## 2023-06-13 DIAGNOSIS — A419 Sepsis, unspecified organism: Secondary | ICD-10-CM | POA: Diagnosis not present

## 2023-06-13 DIAGNOSIS — N1330 Unspecified hydronephrosis: Secondary | ICD-10-CM | POA: Diagnosis not present

## 2023-06-13 DIAGNOSIS — G9341 Metabolic encephalopathy: Secondary | ICD-10-CM | POA: Diagnosis not present

## 2023-06-13 LAB — COMPREHENSIVE METABOLIC PANEL WITH GFR
ALT: 15 U/L (ref 0–44)
AST: 24 U/L (ref 15–41)
Albumin: 2.2 g/dL — ABNORMAL LOW (ref 3.5–5.0)
Alkaline Phosphatase: 54 U/L (ref 38–126)
Anion gap: 10 (ref 5–15)
BUN: 36 mg/dL — ABNORMAL HIGH (ref 8–23)
CO2: 21 mmol/L — ABNORMAL LOW (ref 22–32)
Calcium: 8.5 mg/dL — ABNORMAL LOW (ref 8.9–10.3)
Chloride: 113 mmol/L — ABNORMAL HIGH (ref 98–111)
Creatinine, Ser: 1.06 mg/dL (ref 0.61–1.24)
GFR, Estimated: >60 mL/min (ref >60)
Glucose, Bld: 73 mg/dL (ref 70–99)
Potassium: 3.6 mmol/L (ref 3.5–5.1)
Sodium: 144 mmol/L (ref 135–145)
Total Bilirubin: 0.5 mg/dL (ref 0.3–1.2)
Total Protein: 4.9 g/dL — ABNORMAL LOW (ref 6.5–8.1)

## 2023-06-13 LAB — CBC WITH DIFFERENTIAL/PLATELET
Abs Immature Granulocytes: 0.1 10*3/uL — ABNORMAL HIGH (ref 0.00–0.07)
Basophils Absolute: 0.1 10*3/uL (ref 0.0–0.1)
Basophils Relative: 0 %
Eosinophils Absolute: 0.2 10*3/uL (ref 0.0–0.5)
Eosinophils Relative: 1 %
HCT: 29.7 % — ABNORMAL LOW (ref 39.0–52.0)
Hemoglobin: 8.9 g/dL — ABNORMAL LOW (ref 13.0–17.0)
Immature Granulocytes: 1 %
Lymphocytes Relative: 7 %
Lymphs Abs: 0.9 10*3/uL (ref 0.7–4.0)
MCH: 30.6 pg (ref 26.0–34.0)
MCHC: 30 g/dL (ref 30.0–36.0)
MCV: 102.1 fL — ABNORMAL HIGH (ref 80.0–100.0)
Monocytes Absolute: 0.7 10*3/uL (ref 0.1–1.0)
Monocytes Relative: 5 %
Neutro Abs: 11.2 10*3/uL — ABNORMAL HIGH (ref 1.7–7.7)
Neutrophils Relative %: 86 %
Platelets: 118 10*3/uL — ABNORMAL LOW (ref 150–400)
RBC: 2.91 MIL/uL — ABNORMAL LOW (ref 4.22–5.81)
RDW: 14.5 % (ref 11.5–15.5)
WBC: 13.1 10*3/uL — ABNORMAL HIGH (ref 4.0–10.5)
nRBC: 0 % (ref 0.0–0.2)

## 2023-06-13 LAB — PHOSPHORUS: Phosphorus: 2.5 mg/dL (ref 2.5–4.6)

## 2023-06-13 LAB — VITAMIN B12: Vitamin B-12: 519 pg/mL (ref 180–914)

## 2023-06-13 LAB — IRON AND TIBC
Iron: 69 ug/dL (ref 45–182)
Saturation Ratios: 47 % — ABNORMAL HIGH (ref 17.9–39.5)
TIBC: 148 ug/dL — ABNORMAL LOW (ref 250–450)
UIBC: 79 ug/dL

## 2023-06-13 LAB — FOLATE: Folate: 5.3 ng/mL — ABNORMAL LOW (ref >5.9)

## 2023-06-13 LAB — RETICULOCYTES
Immature Retic Fract: 13.1 % (ref 2.3–15.9)
RBC.: 2.89 MIL/uL — ABNORMAL LOW (ref 4.22–5.81)
Retic Count, Absolute: 26.3 10*3/uL (ref 19.0–186.0)
Retic Ct Pct: 0.9 % (ref 0.4–3.1)

## 2023-06-13 LAB — PROCALCITONIN: Procalcitonin: 81.35 ng/mL

## 2023-06-13 LAB — FERRITIN: Ferritin: 258 ng/mL (ref 24–336)

## 2023-06-13 LAB — MAGNESIUM: Magnesium: 1.7 mg/dL (ref 1.7–2.4)

## 2023-06-13 MED ORDER — CHLORHEXIDINE GLUCONATE CLOTH 2 % EX PADS
6.0000 | MEDICATED_PAD | Freq: Every day | CUTANEOUS | Status: DC
  Administered 2023-06-13 – 2023-06-21 (×7): 6 via TOPICAL

## 2023-06-13 MED ORDER — LORAZEPAM 2 MG/ML IJ SOLN: 0.5000 mg | Freq: Once | INTRAMUSCULAR | Status: DC

## 2023-06-13 MED ORDER — MAGNESIUM SULFATE 2 GM/50ML IV SOLN
2.0000 g | Freq: Once | INTRAVENOUS | Status: AC
  Administered 2023-06-13: 2 g via INTRAVENOUS
  Filled 2023-06-13: qty 50

## 2023-06-13 MED ORDER — HALOPERIDOL LACTATE 5 MG/ML IJ SOLN
2.0000 mg | Freq: Four times a day (QID) | INTRAMUSCULAR | Status: DC | PRN
  Administered 2023-06-13 – 2023-06-14 (×4): 2 mg via INTRAVENOUS
  Filled 2023-06-13 (×7): qty 1

## 2023-06-13 MED ORDER — OLANZAPINE 5 MG PO TBDP
5.0000 mg | ORAL_TABLET | Freq: Every day | ORAL | Status: DC
  Administered 2023-06-13 – 2023-06-14 (×2): 5 mg via ORAL
  Filled 2023-06-13 (×2): qty 1

## 2023-06-13 MED ORDER — ENOXAPARIN SODIUM 40 MG/0.4ML IJ SOSY
40.0000 mg | PREFILLED_SYRINGE | INTRAMUSCULAR | Status: DC
  Administered 2023-06-16 – 2023-06-21 (×6): 40 mg via SUBCUTANEOUS
  Filled 2023-06-13 (×7): qty 0.4

## 2023-06-13 MED ORDER — LACTATED RINGERS IV SOLN: INTRAVENOUS | Status: AC

## 2023-06-13 MED ORDER — LEVETIRACETAM 500 MG PO TABS
500.0000 mg | ORAL_TABLET | Freq: Two times a day (BID) | ORAL | Status: DC
  Administered 2023-06-13 – 2023-06-15 (×6): 500 mg via ORAL
  Filled 2023-06-13 (×6): qty 1

## 2023-06-13 MED ORDER — ADULT MULTIVITAMIN W/MINERALS CH
1.0000 | ORAL_TABLET | Freq: Every day | ORAL | Status: DC
  Administered 2023-06-13 – 2023-06-21 (×8): 1 via ORAL
  Filled 2023-06-13 (×9): qty 1

## 2023-06-13 NOTE — Progress Notes (Signed)
PROGRESS NOTE    Aaron Hickman  ZOX:[id number] DOB: [birth year] DOA: 06/11/2023 PCP: [facility]   Brief Narrative:  The patient is a 87 year old Caucasian male with past medical history significant for but limited to medial sphenoid meningioma status postradiation, chronic vasogenic edema who has been noncompliant with steroids, seizures, BPH as well as other comorbidities who presented with altered mental status.  Patient was arousable to light noxious stimuli on the time of admission and would push away the admitted hand but otherwise remained asleep.  Admitted spoke with the wife over the phone who provided limited history and she stated for last few days he has been sleepy and not wanting to do anything.  Normally does everything on his own has not been eating or drinking or taking any medications including his antiepileptics.  He had a urinary tract infection that was treated 2 weeks ago and he has a history of urinary retention requiring Foley catheter but the wife thinks that he was frequently urinating.  He came to the ED and was found to be febrile, tachycardic and a leukocytosis of 28.3.  Also had a metabolic acidosis and UA is grossly positive for moderate leukocytes positive nitrites and many bacteria.  He was started on fluids and antibiotics and admitted for sepsis and also found to have a gram-negative bacteremia and Citrobacter Freundii UTI  that is pan-sensitive.   Since AKI is improving but patient starting to have some behavioral issues and agitation so he was initiated on Haldol.  Will continue IV fluid hydration through today.  Assessment and Plan:  Sepsis secondary to Citrobacter Freundii UTI (HCC) and Gram-Negative Bacteremia -UA grossly positive and presented with fever, tachycardia and leukocytosis -Urinalysis showed large hemoglobin, moderate leukocytes, positive nitrites, many bacteria, greater than 50 RBCs per high-power field, greater than 50  WBCs with 0-5 squamous epithelial cells -Blood cultures x 2 obtained and showing gram-negative bacteremia -Urine cultures obtained and showing greater than 100,000 colony forming units of gram-negative rods -Continue with antibiotics with IV ceftriaxone and escalate IV cefepime based on sensitivities if necessary -Continue with IV fluid hydration as below -Procalcitonin Level was 81.35 -Lactic Acid Level Trend:  Recent Labs  Lab 06/11/23 1614  LATICACIDVEN 1.7  -WBC Trend: Recent Labs  Lab 05/17/23 1300 05/21/23 1430 06/11/23 1605 06/12/23 0552 06/13/23 0526  WBC 10.2 14.6* 28.3* 25.3* 13.1*  -Repeat CBC in a.m. -Will need PT/OT to evaluate when improved   Acute Metabolic Encephalopathy and Agitation -Secondary to sepsis from urinary tract infection and bacteremia -Continue to monitor with treatment above. Normally is responsive and independent on all ADLS.  -CT scan as below -Remains confused currently and slowly improvingand if not improving with treatment of his infection we will obtain a head imaging and discussion with neurology -Placed on delirium precautions -Continues to be Agitated and will not continue Lorazepam but will initiate IV Haloperidol 2 mg q6hprn    Bilateral Hydronephrosis -Likely secondary to chronic outlet obstruction with enlarged prostate -No retention on bladder scan  -CT Scan of the Abd/Pelvis done and showed "Mild bilateral hydronephrosis, new since the prior CT, likely related to chronic bladder outlet obstruction. No stone. Enlarged prostate gland with findings of chronic bladder outlet obstruction. Mild sigmoid diverticulosis. No bowel obstruction. Normal appendix. Aortic Atherosclerosis " -Had a Foley catheter placed   AKI/Acute Renal Failure (ARF) (HCC) -BUN/Cr Trend: Recent Labs  Lab 05/17/23 1300 05/21/23 1430 06/11/23 1605 06/12/23 0552 06/13/23 0526  BUN 15 28* 66*  57* 36*  CREATININE 1.10 1.17 3.92* 2.06* 1.06  -Baseline appears to  be around 1.1 -Renewed IV fluid hydration with lactated Ringer's for another 12 hours -Foley catheter has been placed -Avoid Nephrotoxic Medications, Contrast Dyes, Hypotension and Dehydration to Ensure Adequate Renal Perfusion and will need to Renally Adjust Meds -Continue to Monitor and Trend Renal Function carefully and repeat CMP in the AM    Metabolic Acidosis, normal anion gap (NAG) -Secondary to dehydration and acute renal failure -Continue IV fluid and renewed the order but reduce the rate from 150 mL/h to 75 mL/h for another 24 hours -Patient's CO2 is now 21, anion gap is 10, chloride level is 113 -Continue monitor trend and repeat CMP in the a.m.  Thrombocytopenia -In the setting of infection and sepsis -Patient's platelet count  Trend: Recent Labs  Lab 05/17/23 1300 05/21/23 1430 06/11/23 1605 06/12/23 0552 06/13/23 0526  PLT 435* 531* 139* 117* 118*  -Continue to monitor and trend and repeat CBC in a.m.  Normocytic Anemia -Hgb/Hct Trend: Recent Labs  Lab 05/17/23 1300 05/21/23 1430 06/11/23 1605 06/12/23 0552 06/13/23 0526  HGB 11.7* 11.0* 9.4* 9.1* 8.9*  HCT 36.6* 34.1* 29.3* 28.5* 29.7*  MCV 97.3 95.5 97.0 98.3 102.1*  -Checked Anemia Panel and showed an iron level of 69, UIBC of 79, TIBC 148, saturation ratios of 47%, Ferritin level 258, folate level 5.3 and vitamin B12 519 -Continue to Monitor for S/Sx of Bleeding; No overt bleeding noted -Repeat CBC in the AM    Seizure (HCC) -Keppra to be renally dose with acute renal failure -Change back to po Keppra and will continue 500 mg BID -Placed on seizure precautions   Meningioma (HCC) -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 Redemonstrated vasogenic edema in the anterior right temporal lobe, and the right insula. Known lesion abutting the right cavernous sinus is poorly visualized  due to CT technique. Consider further evaluation with contrast-enhanced brain MRI more definitive characterization."  Hypoalbuminemia -Patient's Albumin Trend: Recent Labs  Lab 05/17/23 1300 05/21/23 1554 06/11/23 1605 06/13/23 0526  ALBUMIN 2.8* 3.2* 2.6* 2.2*  -Continue to Monitor and Trend and repeat CMP in the AM   DVT prophylaxis: enoxaparin (LOVENOX) injection 40 mg Start: 06/14/23 1000    Code Status: Full Code Family Communication: No family currently at bedside but I spoke with the patient's wife over the telephone  Disposition Plan:  Level of care: Telemetry Status is: Inpatient Remains inpatient appropriate because: Needs further clinical improvement and evaluation by PT and OT   Consultants:  None  Procedures:  As delineated as above  Antimicrobials:  Anti-infectives (From admission, onward)    Start     Dose/Rate Route Frequency Ordered Stop   06/12/23 1600  cefTRIAXone (ROCEPHIN) 2 g in sodium chloride 0.9 % 100 mL IVPB        2 g 200 mL/hr over 30 Minutes Intravenous Every 24 hours 06/11/23 2104     06/11/23 1615  cefTRIAXone (ROCEPHIN) 1 g in sodium chloride 0.9 % 100 mL IVPB  Status:  Discontinued        1 g 200 mL/hr over 30 Minutes Intravenous Every 24 hours 06/11/23 1603 06/11/23 1608   06/11/23 1615  cefTRIAXone (ROCEPHIN) 2 g in sodium chloride 0.9 % 100 mL IVPB        2 g 200 mL/hr over 30 Minutes Intravenous  Once 06/11/23  1608 06/11/23 1741       Subjective: Seen and examined at bedside and he was little agitated sitting in the chair bedside.  Nurse had to give him Haldol this morning given his agitation.  He is complaining of not eating and wanting something to drink so diet has been advanced.  Also complaining about wanting to go home.  No other concerns requested this time.  Objective: Vitals:   06/12/23 1304 06/12/23 2044 06/13/23 0602 06/13/23 1246  BP:  (!) 158/78 (!) 142/72 137/76  Pulse:  73 65 72  Resp:  20 18 18   Temp: 98.3 F  (36.8 C) (!) 97.5 F (36.4 C) 97.9 F (36.6 C) (!) 97.4 F (36.3 C)  TempSrc: Oral   Oral  SpO2:  99% 100% 100%  Weight:      Height:        Intake/Output Summary (Last 24 hours) at 06/13/2023 1428 Last data filed at 06/13/2023 3235 Gross per 24 hour  Intake 2368.63 ml  Output 1700 ml  Net 668.63 ml   Filed Weights   06/12/23 0902  Weight: 69.9 kg   Examination: Physical Exam:  Constitutional: Thin chronically ill-appearing Caucasian male who appears a little frustrated sitting in a chair at bedside and remains a little confused Respiratory: Diminished to auscultation bilaterally, no wheezing, rales, rhonchi or crackles. Normal respiratory effort and patient is not tachypenic. No accessory muscle use.  Unlabored breathing Cardiovascular: RRR, no murmurs / rubs / gallops. S1 and S2 auscultated. No extremity edema. Abdomen: Soft, non-tender, non-distended. Bowel sounds positive.  GU: Deferred. Musculoskeletal: No clubbing / cyanosis of digits/nails. No joint deformity upper and lower extremities.  Skin: No rashes, lesions, ulcers on limited skin evaluation. No induration; Warm and dry.  Neurologic: CN 2-12 grossly intact with no focal deficits.  Romberg sign and cerebellar reflexes not assessed.  Psychiatric: Normal judgment and insight.  A little agitated and he is awake and more alert but still remains intermittently confused  Data Reviewed: I have personally reviewed following labs and imaging studies  CBC: Recent Labs  Lab 06/11/23 1605 06/12/23 0552 06/13/23 0526  WBC 28.3* 25.3* 13.1*  NEUTROABS 23.4*  --  11.2*  HGB 9.4* 9.1* 8.9*  HCT 29.3* 28.5* 29.7*  MCV 97.0 98.3 102.1*  PLT 139* 117* 118*   Basic Metabolic Panel: Recent Labs  Lab 06/11/23 1605 06/12/23 0552 06/13/23 0526  NA 141 144 144  K 4.1 3.9 3.6  CL 113* 116* 113*  CO2 17* 19* 21*  GLUCOSE 119* 104* 73  BUN 66* 57* 36*  CREATININE 3.92* 2.06* 1.06  CALCIUM 8.3* 8.1* 8.5*  MG  --   --  1.7   PHOS  --   --  2.5   GFR: Estimated Creatinine Clearance: 51.3 mL/min (by C-G formula based on SCr of 1.06 mg/dL). Liver Function Tests: Recent Labs  Lab 06/11/23 1605 06/13/23 0526  AST 30 24  ALT 17 15  ALKPHOS 64 54  BILITOT 0.8 0.5  PROT 5.3* 4.9*  ALBUMIN 2.6* 2.2*   No results for input(s): "LIPASE", "AMYLASE" in the last 168 hours. No results for input(s): "AMMONIA" in the last 168 hours. Coagulation Profile: Recent Labs  Lab 06/11/23 1605  INR 1.7*   Cardiac Enzymes: No results for input(s): "CKTOTAL", "CKMB", "CKMBINDEX", "TROPONINI" in the last 168 hours. BNP (last 3 results) No results for input(s): "PROBNP" in the last 8760 hours. HbA1C: No results for input(s): "HGBA1C" in the last 72 hours. CBG: Recent  Labs  Lab 06/11/23 1600  GLUCAP 113*   Lipid Profile: No results for input(s): "CHOL", "HDL", "LDLCALC", "TRIG", "CHOLHDL", "LDLDIRECT" in the last 72 hours. Thyroid Function Tests: No results for input(s): "TSH", "T4TOTAL", "FREET4", "T3FREE", "THYROIDAB" in the last 72 hours. Anemia Panel: Recent Labs    06/13/23 0526  VITAMINB12 519  FOLATE 5.3*  FERRITIN 258  TIBC 148*  IRON 69  RETICCTPCT 0.9   Sepsis Labs: Recent Labs  Lab 06/11/23 1614 06/13/23 0526  PROCALCITON  --  81.35  LATICACIDVEN 1.7  --    Recent Results (from the past 240 hour(s))  Resp panel by RT-PCR (RSV, Flu A&B, Covid) Anterior Nasal Swab     Status: None   Collection Time: 06/11/23  4:05 PM   Specimen: Anterior Nasal Swab  Result Value Ref Range Status   SARS Coronavirus 2 by RT PCR NEGATIVE NEGATIVE Final    Comment: (NOTE) SARS-CoV-2 target nucleic acids are NOT DETECTED.  The SARS-CoV-2 RNA is generally detectable in upper respiratory specimens during the acute phase of infection. The lowest concentration of SARS-CoV-2 viral copies this assay can detect is 138 copies/mL. A negative result does not preclude SARS-Cov-2 infection and should not be used as the  sole basis for treatment or other patient management decisions. A negative result may occur with  improper specimen collection/handling, submission of specimen other than nasopharyngeal swab, presence of viral mutation(s) within the areas targeted by this assay, and inadequate number of viral copies(<138 copies/mL). A negative result must be combined with clinical observations, patient history, and epidemiological information. The expected result is Negative.  Fact Sheet for Patients:  BloggerCourse.com  Fact Sheet for Healthcare Providers:  SeriousBroker.it  This test is no t yet approved or cleared by the [place] FDA and  has been authorized for detection and/or diagnosis of SARS-CoV-2 by FDA under an Emergency Use Authorization (EUA). This EUA will remain  in effect (meaning this test can be used) for the duration of the COVID-19 declaration under Section 564(b)(1) of the Act, 21 U.S.C.section 360bbb-3(b)(1), unless the authorization is terminated  or revoked sooner.       Influenza A by PCR NEGATIVE NEGATIVE Final   Influenza B by PCR NEGATIVE NEGATIVE Final    Comment: (NOTE) The Xpert Xpress SARS-CoV-2/FLU/RSV plus assay is intended as an aid in the diagnosis of influenza from Nasopharyngeal swab specimens and should not be used as a sole basis for treatment. Nasal washings and aspirates are unacceptable for Xpert Xpress SARS-CoV-2/FLU/RSV testing.  Fact Sheet for Patients: BloggerCourse.com  Fact Sheet for Healthcare Providers: SeriousBroker.it  This test is not yet approved or cleared by the [place] FDA and has been authorized for detection and/or diagnosis of SARS-CoV-2 by FDA under an Emergency Use Authorization (EUA). This EUA will remain in effect (meaning this test can be used) for the duration of the COVID-19 declaration under Section 564(b)(1) of the  Act, 21 U.S.C. section 360bbb-3(b)(1), unless the authorization is terminated or revoked.     Resp Syncytial Virus by PCR NEGATIVE NEGATIVE Final    Comment: (NOTE) Fact Sheet for Patients: BloggerCourse.com  Fact Sheet for Healthcare Providers: SeriousBroker.it  This test is not yet approved or cleared by the [place] FDA and has been authorized for detection and/or diagnosis of SARS-CoV-2 by FDA under an Emergency Use Authorization (EUA). This EUA will remain in effect (meaning this test can be used) for the duration of the COVID-19 declaration under Section 564(b)(1) of the Act, 21  U.S.C. section 360bbb-3(b)(1), unless the authorization is terminated or revoked.  Performed at [facility], [address] [place], [place] [address]   Blood Culture (routine x 2)     Status: None (Preliminary result)   Collection Time: 06/11/23  4:05 PM   Specimen: BLOOD  Result Value Ref Range Status   Specimen Description   Final    BLOOD SITE NOT SPECIFIED Performed at [facility], [address], [place], [place] [address]    Special Requests   Final    BOTTLES DRAWN AEROBIC AND ANAEROBIC Blood Culture adequate volume Performed at [facility], [address] [place], [place] [address]    Culture  Setup Time   Final    GRAM NEGATIVE RODS IN BOTH AEROBIC AND ANAEROBIC BOTTLES CRITICAL RESULT CALLED TO, READ BACK BY AND VERIFIED WITH: PHARMD ANH PHAM [phone] BY Berline Chough, [place]    Culture   Final    GRAM NEGATIVE RODS IDENTIFICATION AND SUSCEPTIBILITIES TO FOLLOW Performed at [facility], [address], [place], [place] [address]    Report Status PENDING  Incomplete  Blood Culture ID Panel (Reflexed)     Status: Abnormal   Collection Time: 06/11/23  4:05 PM  Result Value Ref Range Status   Enterococcus faecalis NOT DETECTED NOT DETECTED Final   Enterococcus  Faecium NOT DETECTED NOT DETECTED Final   Listeria monocytogenes NOT DETECTED NOT DETECTED Final   Staphylococcus species NOT DETECTED NOT DETECTED Final   Staphylococcus aureus (BCID) NOT DETECTED NOT DETECTED Final   Staphylococcus epidermidis NOT DETECTED NOT DETECTED Final   Staphylococcus lugdunensis NOT DETECTED NOT DETECTED Final   Streptococcus species NOT DETECTED NOT DETECTED Final   Streptococcus agalactiae NOT DETECTED NOT DETECTED Final   Streptococcus pneumoniae NOT DETECTED NOT DETECTED Final   Streptococcus pyogenes NOT DETECTED NOT DETECTED Final   A.calcoaceticus-baumannii NOT DETECTED NOT DETECTED Final   Bacteroides fragilis NOT DETECTED NOT DETECTED Final   Enterobacterales DETECTED (A) NOT DETECTED Final    Comment: Enterobacterales represent a large order of gram negative bacteria, not a single organism. Refer to culture for further identification. CRITICAL RESULT CALLED TO, READ BACK BY AND VERIFIED WITH: PHARMD ANH PHAM [phone] [phone] BY J [id number], MT    Enterobacter cloacae complex NOT DETECTED NOT DETECTED Final   Escherichia coli NOT DETECTED NOT DETECTED Final   Klebsiella aerogenes NOT DETECTED NOT DETECTED Final   Klebsiella oxytoca NOT DETECTED NOT DETECTED Final   Klebsiella pneumoniae NOT DETECTED NOT DETECTED Final   Proteus species NOT DETECTED NOT DETECTED Final   Salmonella species NOT DETECTED NOT DETECTED Final   Serratia marcescens NOT DETECTED NOT DETECTED Final   Haemophilus influenzae NOT DETECTED NOT DETECTED Final   Neisseria meningitidis NOT DETECTED NOT DETECTED Final   Pseudomonas aeruginosa NOT DETECTED NOT DETECTED Final   Stenotrophomonas maltophilia NOT DETECTED NOT DETECTED Final   Candida albicans NOT DETECTED NOT DETECTED Final   Candida auris NOT DETECTED NOT DETECTED Final   Candida glabrata NOT DETECTED NOT DETECTED Final   Candida krusei NOT DETECTED NOT DETECTED Final   Candida parapsilosis NOT DETECTED NOT DETECTED Final    Candida tropicalis NOT DETECTED NOT DETECTED Final   Cryptococcus neoformans/gattii NOT DETECTED NOT DETECTED Final   CTX-M ESBL NOT DETECTED NOT DETECTED Final   Carbapenem resistance IMP NOT DETECTED NOT DETECTED Final   Carbapenem resistance KPC NOT DETECTED NOT DETECTED Final   Carbapenem resistance NDM NOT  DETECTED NOT DETECTED Final   Carbapenem resist OXA 48 LIKE NOT DETECTED NOT DETECTED Final   Carbapenem resistance VIM NOT DETECTED NOT DETECTED Final    Comment: Performed at [facility], [address] [place], [place] [address]  Blood Culture (routine x 2)     Status: None (Preliminary result)   Collection Time: 06/11/23  4:08 PM   Specimen: BLOOD LEFT FOREARM  Result Value Ref Range Status   Specimen Description   Final    BLOOD LEFT FOREARM Performed at [facility], [address], [place], [place] [address]    Special Requests   Final    BOTTLES DRAWN AEROBIC AND ANAEROBIC Blood Culture adequate volume Performed at [facility], [address], [place], [place] [address]    Culture  Setup Time   Final    GRAM NEGATIVE RODS AEROBIC BOTTLE ONLY CRITICAL VALUE NOTED.  VALUE IS CONSISTENT WITH PREVIOUSLY REPORTED AND CALLED VALUE. Performed at [facility], [address] [place], [place] [address]    Culture GRAM NEGATIVE RODS  Final   Report Status PENDING  Incomplete  Urine Culture     Status: Abnormal   Collection Time: 06/11/23  6:14 PM   Specimen: Urine, Random  Result Value Ref Range Status   Specimen Description   Final    URINE, RANDOM Performed at [facility], [address], [place], [place] [address]    Special Requests   Final    NONE Reflexed from [id number] Performed at [facility], [address] [place], [place] [address]    Culture >=100,000 COLONIES/mL CITROBACTER FREUNDII (A)  Final   Report Status 06/13/2023 FINAL  Final   Organism ID, Bacteria CITROBACTER FREUNDII  (A)  Final      Susceptibility   Citrobacter freundii - MIC*    CEFEPIME <=0.12 SENSITIVE Sensitive     CEFTRIAXONE <=0.25 SENSITIVE Sensitive     CIPROFLOXACIN <=0.25 SENSITIVE Sensitive     GENTAMICIN <=1 SENSITIVE Sensitive     IMIPENEM 1 SENSITIVE Sensitive     NITROFURANTOIN <=16 SENSITIVE Sensitive     TRIMETH/SULFA <=20 SENSITIVE Sensitive     PIP/TAZO <=4 SENSITIVE Sensitive     * >=100,000 COLONIES/mL CITROBACTER FREUNDII    Radiology Studies: CT ABDOMEN PELVIS WO CONTRAST  Result Date: 06/11/2023 CLINICAL DATA:  Sepsis. EXAM: CT ABDOMEN AND PELVIS WITHOUT CONTRAST TECHNIQUE: Multidetector CT imaging of the abdomen and pelvis was performed following the standard protocol without IV contrast. RADIATION DOSE REDUCTION: This exam was performed according to the departmental dose-optimization program which includes automated exposure control, adjustment of the mA and/or kV according to patient size and/or use of iterative reconstruction technique. COMPARISON:  CT dated 03/29/2023. FINDINGS: Evaluation of this exam is limited in the absence of intravenous contrast. Lower chest: Minimal bibasilar dependent atelectasis. There is coronary vascular calcification. No intra-abdominal free air or free fluid. Hepatobiliary: The liver is unremarkable. No BD dilatation. The gallbladder is unremarkable. Pancreas: The distal body and tail of the pancreas are atrophic. There is a focus of coarse calcification in the head and uncinate process of the pancreas similar to prior CT, likely sequela of chronic pancreatitis. No active inflammatory changes. Spleen: Normal in size without focal abnormality. Adrenals/Urinary Tract: The adrenal glands are unremarkable. There is mild bilateral hydronephrosis, new since the prior CT. No stone. There is diffuse thickened and trabeculated appearance of the bladder wall indicative of chronic bladder outlet obstruction.  Correlation with urinalysis recommended to exclude  superimposed UTI. Stomach/Bowel: Mild sigmoid diverticulosis without active inflammatory changes. There is no bowel obstruction or active inflammation. The appendix is normal. Vascular/Lymphatic: Moderate aortoiliac atherosclerotic disease. The IVC is unremarkable. No portal venous gas. There is no adenopathy. Reproductive: Enlarged prostate gland with median lobe hypertrophy protruding through the base of the bladder. Other: None Musculoskeletal: Degenerative changes.  No acute osseous pathology. IMPRESSION: 1. Mild bilateral hydronephrosis, new since the prior CT, likely related to chronic bladder outlet obstruction. No stone. 2. Enlarged prostate gland with findings of chronic bladder outlet obstruction. 3. Mild sigmoid diverticulosis. No bowel obstruction. Normal appendix. 4.  Aortic Atherosclerosis (ICD10-I70.0). Electronically Signed   By: Elgie Collard M.D.   On: 06/11/2023 18:56   CT Head Wo Contrast  Result Date: 06/11/2023 CLINICAL DATA:  Mental status change, unknown cause EXAM: CT HEAD WITHOUT CONTRAST TECHNIQUE: Contiguous axial images were obtained from the base of the skull through the vertex without intravenous contrast. RADIATION DOSE REDUCTION: This exam was performed according to the departmental dose-optimization program which includes automated exposure control, adjustment of the mA and/or kV according to patient size and/or use of iterative reconstruction technique. COMPARISON:  CT head 05/17/23, Brain MRI 01/02/22 FINDINGS: Brain: No hemorrhage. No extra-axial fluid collection. There is a peripheral 1.3 x 0.7 cm hyperdense lesion along the right cerebral convexity (series 2, image 20). This was likely present on prior imaging. This was seen on prior brain MRI dated 01/02/2022, and favored to represent a small meningioma. Redemonstrated vasogenic edema in the anterior right temporal lobe, and the right insula. Known lesion abutting the right cavernous sinus is poorly visualized due to CT  technique. No hemorrhage. No extra-axial fluid collection. No CT evidence of an acute cortical infarct. No midline shift. Vascular: No hyperdense vessel or unexpected calcification. Skull: Normal. Negative for fracture or focal lesion. Sinuses/Orbits: no middle ear or mastoid effusion. Paranasal sinuses are clear. Bilateral lens replacement. Orbits are otherwise unremarkable. Other: None. IMPRESSION: 1. No acute intracranial abnormality. 2. Redemonstrated vasogenic edema in the anterior right temporal lobe, and the right insula. Known lesion abutting the right cavernous sinus is poorly visualized due to CT technique. Consider further evaluation with contrast-enhanced brain MRI more definitive characterization. Electronically Signed   By: Lorenza Cambridge M.D.   On: 06/11/2023 18:07   DG Chest Port 1 View  Result Date: 06/11/2023 CLINICAL DATA:  Altered mental status. EXAM: PORTABLE CHEST 1 VIEW COMPARISON:  May 17, 2023. FINDINGS: The heart size and mediastinal contours are within normal limits. Hypoinflation of the lungs with minimal bibasilar subsegmental atelectasis. The visualized skeletal structures are unremarkable. IMPRESSION: Hypoinflation of the lungs with minimal bibasilar subsegmental atelectasis. Electronically Signed   By: Lupita Raider M.D.   On: 06/11/2023 16:39    Scheduled Meds:  [START ON 06/14/2023] enoxaparin (LOVENOX) injection  40 mg Subcutaneous Q24H   levETIRAcetam  500 mg Oral BID   levothyroxine  50 mcg Oral Q0600   multivitamin with minerals  1 tablet Oral Daily   OLANZapine zydis  5 mg Oral Daily   sodium chloride  1 g Oral BID WC   Continuous Infusions:  cefTRIAXone (ROCEPHIN)  IV 200 mL/hr at 06/13/23 0526   lactated ringers 75 mL/hr at 06/13/23 1151    LOS: 2 days   Marguerita Merles, DO [facility] Available via Epic secure chat 7am-7pm After these hours, please refer to coverage provider listed on [email or web address] 06/13/2023, 2:28 PM

## 2023-06-13 NOTE — Plan of Care (Signed)
  Problem: Pain Managment: Goal: General experience of comfort will improve Outcome: Progressing   Problem: Safety: Goal: Ability to remain free from injury will improve Outcome: Progressing   Problem: Skin Integrity: Goal: Risk for impaired skin integrity will decrease Outcome: Progressing   

## 2023-06-14 DIAGNOSIS — N179 Acute kidney failure, unspecified: Secondary | ICD-10-CM | POA: Diagnosis not present

## 2023-06-14 DIAGNOSIS — F03911 Unspecified dementia, unspecified severity, with agitation: Secondary | ICD-10-CM

## 2023-06-14 DIAGNOSIS — G9341 Metabolic encephalopathy: Secondary | ICD-10-CM | POA: Diagnosis not present

## 2023-06-14 DIAGNOSIS — A419 Sepsis, unspecified organism: Secondary | ICD-10-CM | POA: Diagnosis not present

## 2023-06-14 DIAGNOSIS — N1330 Unspecified hydronephrosis: Secondary | ICD-10-CM | POA: Diagnosis not present

## 2023-06-14 LAB — GLUCOSE, CAPILLARY: Glucose-Capillary: 63 mg/dL — ABNORMAL LOW (ref 70–99)

## 2023-06-14 IMAGING — MR MR HEAD WO/W CM
13 series · 45 of 48 positions shown · IV contrast (multihance)
Comparison: 06/23/2021
COMPARISON: 06/23/2021

Addendum:
CLINICAL DATA: Brain mass.  Fall 3 months ago

EXAM:
MRI HEAD WITHOUT AND WITH CONTRAST
TECHNIQUE: Multiplanar, multiecho pulse sequences of the brain and surrounding
structures were obtained without and with intravenous contrast.
CONTRAST:  15mL MULTIHANCE GADOBENATE DIMEGLUMINE 529 MG/ML IV SOLN

[Series 2: T1 · sagittal · 5.0mm · 0.49mm/px · 2 of 25 slices shown]
[im 1/25]
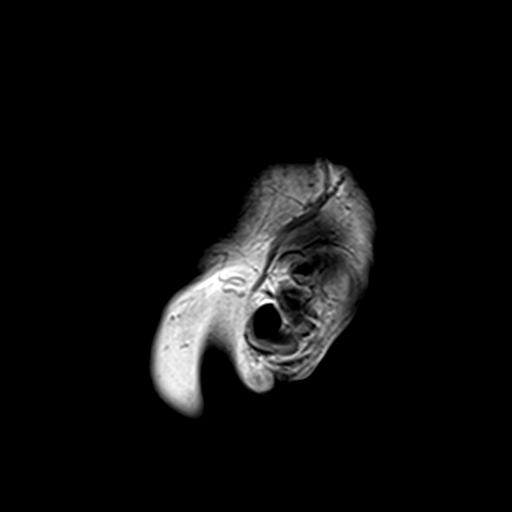
[im 25/25]
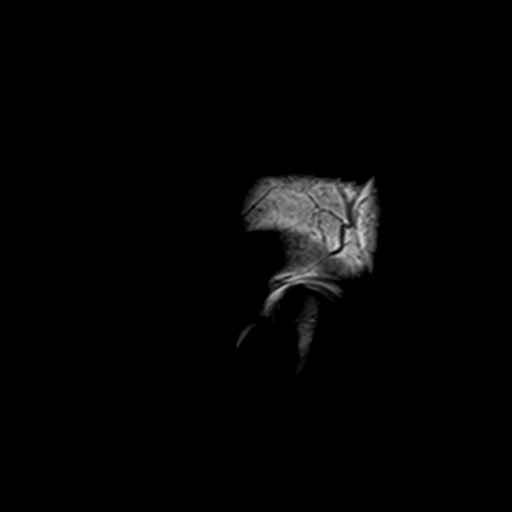

[Series 3: DWI · axial · 3.0mm · 1.88mm/px · z∈[-49,+98]mm · 6 of 104 slices shown (1 of 4)]
[im 1/104]
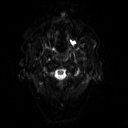
[im 21/104]
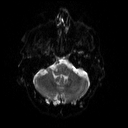
[im 42/104]
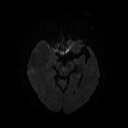
[im 62/104]
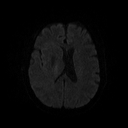
[im 83/104]
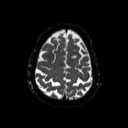
[im 104/104]
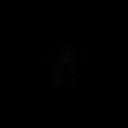

[Series 4: DWI · axial · 3.0mm · 1.88mm/px · z∈[-49,+98]mm · 3 of 52 slices shown (2 of 4)]
[im 1/52]
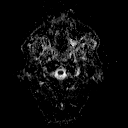
[im 26/52]
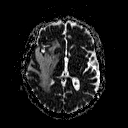
[im 52/52]
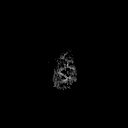

[Series 5: DWI · coronal · 5.0mm · 1.80mm/px · 4 of 74 slices shown (3 of 4)]
[im 1/74]
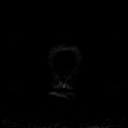
[im 25/74]
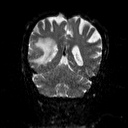
[im 49/74]
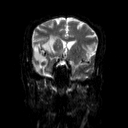
[im 74/74]
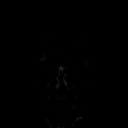

[Series 6: DWI · coronal · 5.0mm · 1.80mm/px · 2 of 37 slices shown (4 of 4)]
[im 1/37]
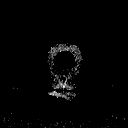
[im 37/37]
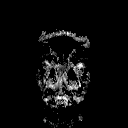

[Series 7: T2 · axial · 5.0mm · 0.72mm/px · 1 of 24 slices shown (1 of 2)]
[im 1/24]
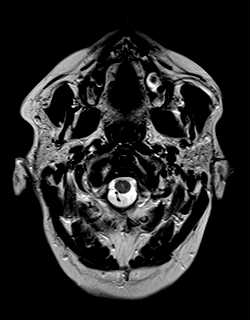

[Series 8: FLAIR · axial · 3.0mm · 0.47mm/px · z∈[-40,+106]mm · 2 of 34 slices shown]
[im 1/34]
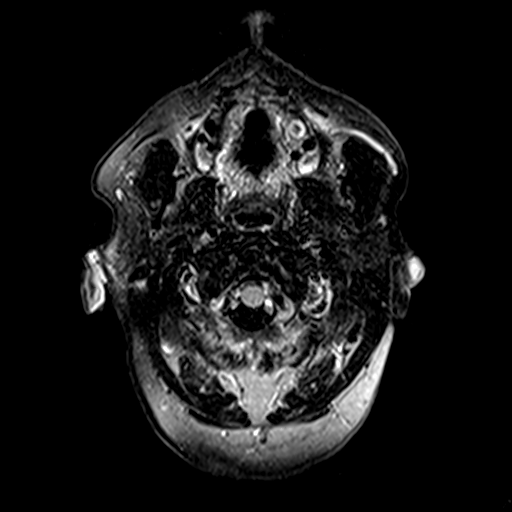
[im 34/34]
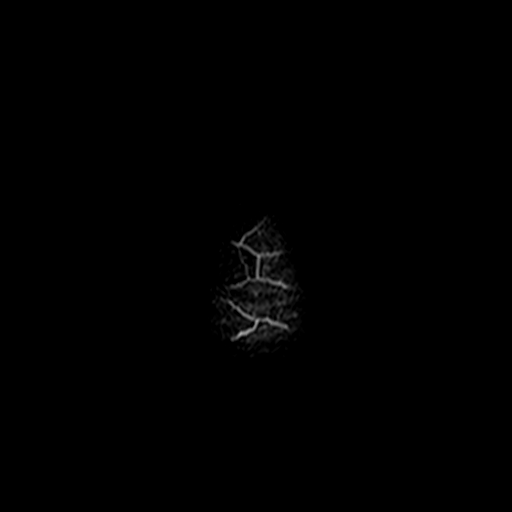

[Series 10: swi_images · axial · 4.0mm · 0.94mm/px · z∈[-42,+107]mm · 2 of 40 slices shown]
[im 1/40]
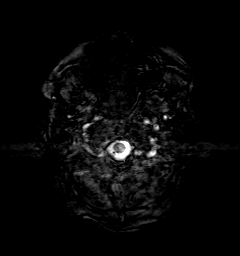
[im 40/40]
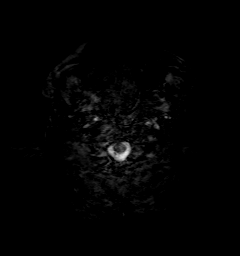

[Series 11: t1_mpr_tra · axial · 1.0mm · 0.75mm/px · z∈[-45,+107]mm · 9 of 160 slices shown]
[im 1/160]
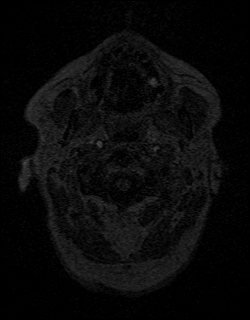
[im 18/160]
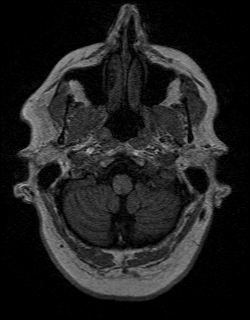
[im 36/160]
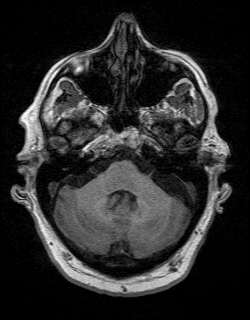
[im 54/160]
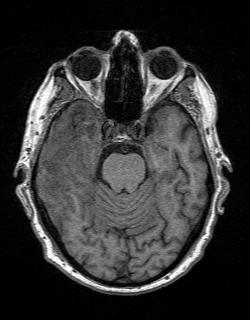
[im 71/160]
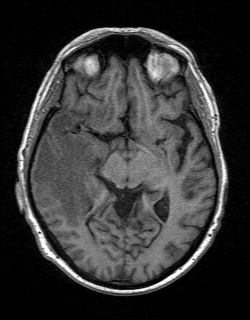
[im 89/160]
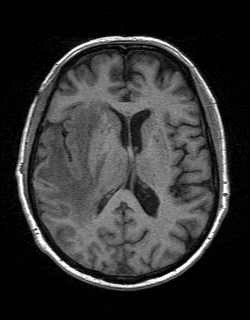
[im 107/160]
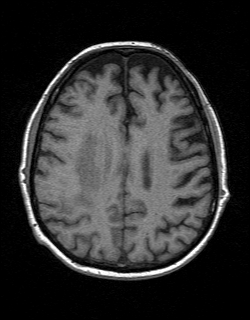
[im 142/160]
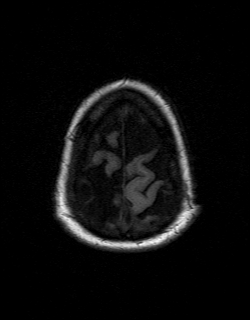
[im 160/160]
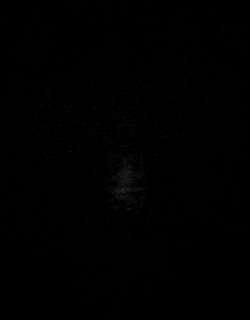

[Series 12: T2 · coronal · 5.0mm · 0.45mm/px · 2 of 29 slices shown (2 of 2)]
[im 1/29]
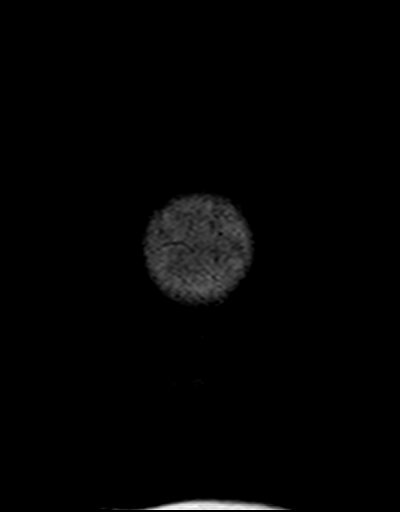
[im 29/29]
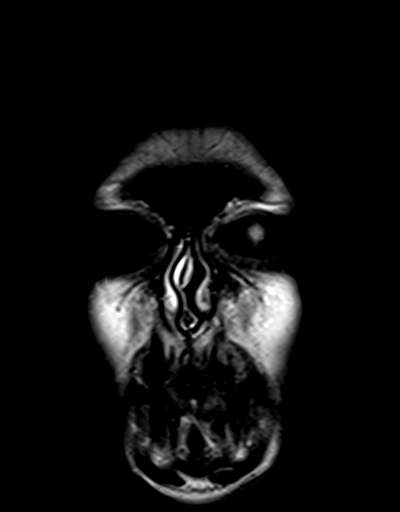

[Series 13: t1_mpr_tra post · axial · 1.0mm · 0.75mm/px · z∈[-45,+107]mm · 8 of 160 slices shown]
[im 1/160]
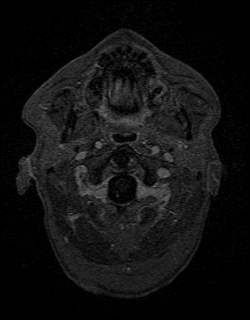
[im 18/160]
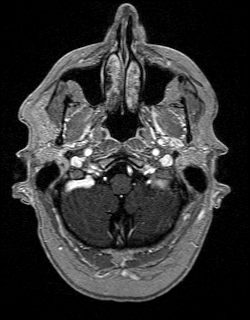
[im 54/160]
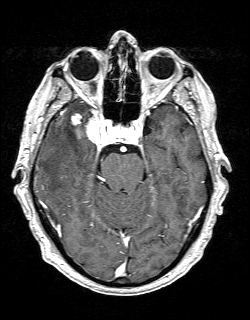
[im 71/160]
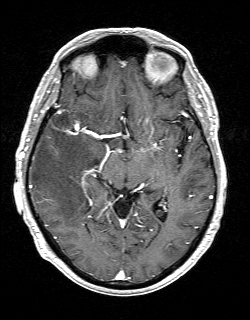
[im 89/160]
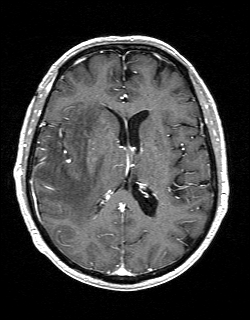
[im 107/160]
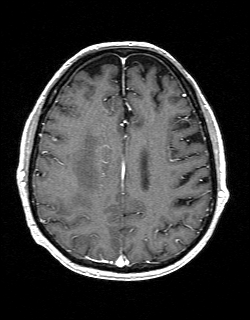
[im 142/160]
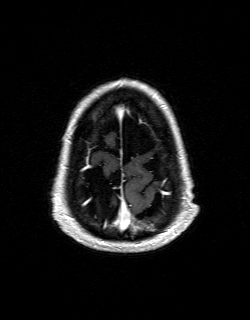
[im 160/160]
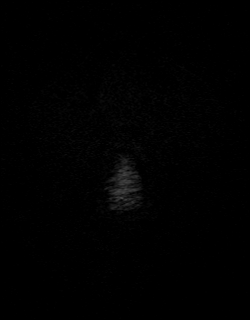

[Series 14: post cor · coronal · 5.0mm · 0.45mm/px · 2 of 29 slices shown]
[im 1/29]
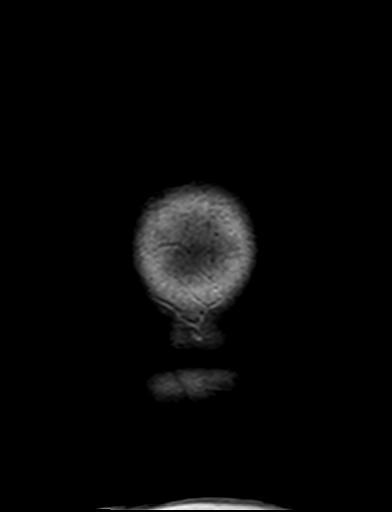
[im 29/29]
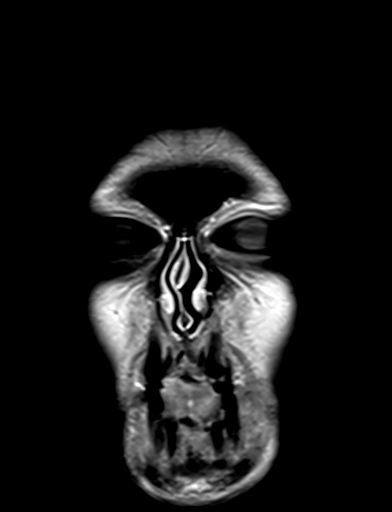

[Series 15: T1 post-contrast · sagittal · 5.0mm · 0.49mm/px · 2 of 25 slices shown]
[im 1/25]
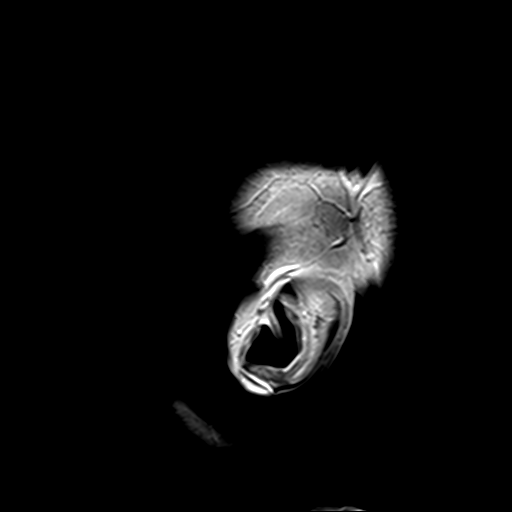
[im 25/25]
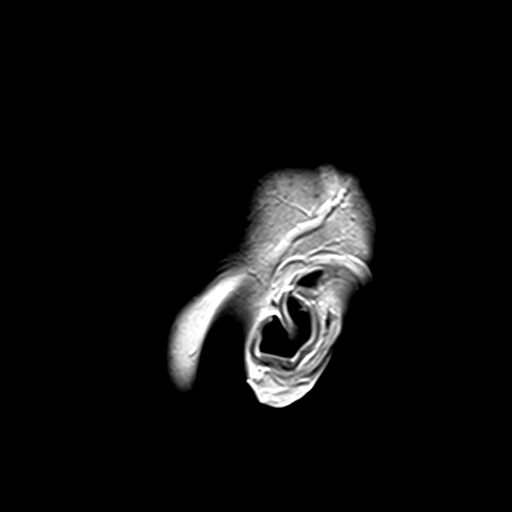

[45 of 48 positions shown; findings below may reference images not displayed]

FINDINGS: Brain: Known right parasellar meningioma treated with radiation
therapy remotely. Mass has unchanged appearance over riding the
right cavernous sinus, anterior right Meckel's cave/foramen ovale,
and anterior clinoid with 2.8 cm craniocaudal span when remeasured
in a similar fashion. Separate but immediately adjacent parenchymal
nodule measuring 9 mm has enlarged, as has exuberant edema involving
the right temporal lobe and extending cranially in the deep white
matter. Infiltrating T2 hyperintensity was considered but there is
well preserved gray-white differentiation more suggestive of
vasogenic edema. No new parenchymal lesion is seen. There is now
mild leftward midline shift.

Separate meningioma appearance along the posterior right temporal
and parietal convexity measuring up to 11 mm, unchanged from most
recent prior.

No infarct, hydrocephalus, or collection.

Vascular: Preserved flow voids and vascular enhancements.
Redemonstrated right MCA bifurcation aneurysm appearance measuring 3
mm.

Skull and upper cervical spine: Normal marrow signal

Sinuses/Orbits: Negative

Other: These results will be called to the ordering clinician or
representative by the Radiologist Assistant, and communication
documented in the PACS or [REDACTED].
IMPRESSION: Increased 9 mm lesion in the anterior right temporal lobe with
progressive and exuberant edema now causing mild midline shift. The
mass is near but distinct from a remotely irradiated middle cranial
fossa meningioma, proximity suggesting radiation related necrosis or
mass.

Unchanged 11 mm right temporoparietal meningioma.

ADDENDUM:
Omitted impression #3 - 3 mm right MCA bifurcation aneurysm is again
noted.

*** End of Addendum ***
FINDINGS: Brain: Known right parasellar meningioma treated with radiation
therapy remotely. Mass has unchanged appearance over riding the
right cavernous sinus, anterior right Meckel's cave/foramen ovale,
and anterior clinoid with 2.8 cm craniocaudal span when remeasured
in a similar fashion. Separate but immediately adjacent parenchymal
nodule measuring 9 mm has enlarged, as has exuberant edema involving
the right temporal lobe and extending cranially in the deep white
matter. Infiltrating T2 hyperintensity was considered but there is
well preserved gray-white differentiation more suggestive of
vasogenic edema. No new parenchymal lesion is seen. There is now
mild leftward midline shift.

Separate meningioma appearance along the posterior right temporal
and parietal convexity measuring up to 11 mm, unchanged from most
recent prior.

No infarct, hydrocephalus, or collection.

Vascular: Preserved flow voids and vascular enhancements.
Redemonstrated right MCA bifurcation aneurysm appearance measuring 3
mm.

Skull and upper cervical spine: Normal marrow signal

Sinuses/Orbits: Negative

Other: These results will be called to the ordering clinician or
representative by the Radiologist Assistant, and communication
documented in the PACS or [REDACTED].
IMPRESSION: Increased 9 mm lesion in the anterior right temporal lobe with
progressive and exuberant edema now causing mild midline shift. The
mass is near but distinct from a remotely irradiated middle cranial
fossa meningioma, proximity suggesting radiation related necrosis or
mass.

Unchanged 11 mm right temporoparietal meningioma.

## 2023-06-14 MED ORDER — MAGNESIUM SULFATE 2 GM/50ML IV SOLN
2.0000 g | Freq: Once | INTRAVENOUS | Status: DC
  Filled 2023-06-14: qty 50

## 2023-06-14 MED ORDER — OLANZAPINE 5 MG PO TBDP
2.5000 mg | ORAL_TABLET | Freq: Once | ORAL | Status: AC
  Administered 2023-06-14: 2.5 mg via ORAL
  Filled 2023-06-14: qty 0.5

## 2023-06-14 MED ORDER — SODIUM CHLORIDE 0.9 % IV SOLN
2.0000 g | Freq: Three times a day (TID) | INTRAVENOUS | Status: DC
  Administered 2023-06-14: 2 g via INTRAVENOUS
  Filled 2023-06-14: qty 12.5

## 2023-06-14 MED ORDER — POTASSIUM CHLORIDE CRYS ER 20 MEQ PO TBCR
40.0000 meq | EXTENDED_RELEASE_TABLET | Freq: Two times a day (BID) | ORAL | Status: AC
  Administered 2023-06-14 (×2): 40 meq via ORAL
  Filled 2023-06-14 (×2): qty 2

## 2023-06-14 MED ORDER — SULFAMETHOXAZOLE-TRIMETHOPRIM 800-160 MG PO TABS
1.0000 | ORAL_TABLET | Freq: Two times a day (BID) | ORAL | Status: DC
  Administered 2023-06-14 – 2023-06-17 (×6): 1 via ORAL
  Filled 2023-06-14 (×9): qty 1

## 2023-06-14 MED ORDER — OLANZAPINE 5 MG PO TBDP
5.0000 mg | ORAL_TABLET | Freq: Every day | ORAL | Status: DC
  Administered 2023-06-15 – 2023-06-20 (×4): 5 mg via ORAL
  Filled 2023-06-14 (×8): qty 1

## 2023-06-14 NOTE — Plan of Care (Signed)
  Problem: Activity: Goal: Risk for activity intolerance will decrease Outcome: Progressing   Problem: Nutrition: Goal: Adequate nutrition will be maintained Outcome: Progressing   Problem: Coping: Goal: Level of anxiety will decrease Outcome: Progressing   Problem: Elimination: Goal: Will not experience complications related to bowel motility Outcome: Progressing   Problem: Elimination: Goal: Will not experience complications related to urinary retention Outcome: Progressing   Problem: Pain Managment: Goal: General experience of comfort will improve Outcome: Progressing   Problem: Safety: Goal: Ability to remain free from injury will improve Outcome: Progressing   

## 2023-06-14 NOTE — Progress Notes (Signed)
PROGRESS NOTE    Aaron Hickman  GEX:[id number] DOB: [birth year] DOA: 06/11/2023 PCP: [facility]   Brief Narrative:  The patient is a 87 year old Caucasian male with past medical history significant for but limited to medial sphenoid meningioma status postradiation, chronic vasogenic edema who has been noncompliant with steroids, seizures, BPH as well as other comorbidities who presented with altered mental status.  Patient was arousable to light noxious stimuli on the time of admission and would push away the admitted hand but otherwise remained asleep.  Admitted spoke with the wife over the phone who provided limited history and she stated for last few days he has been sleepy and not wanting to do anything.  Normally does everything on his own has not been eating or drinking or taking any medications including his antiepileptics.  He had a urinary tract infection that was treated 2 weeks ago and he has a history of urinary retention requiring Foley catheter but the wife thinks that he was frequently urinating.  He came to the ED and was found to be febrile, tachycardic and a leukocytosis of 28.3.  Also had a metabolic acidosis and UA is grossly positive for moderate leukocytes positive nitrites and many bacteria.  He was started on fluids and antibiotics and admitted for sepsis and also found to have a gram-negative bacteremia and Citrobacter Freundii UTI  that is pan-sensitive.   Since AKI is improving but patient starting to have some behavioral issues and agitation so he was initiated on Haldol and also added olanzapine.  IV for hydration has now stopped and will repeat a renal ultrasound in the AM.  Psychiatry has been consulted for his agitation and recommended continuing current interventions adding a second dose of Zyprexa  Assessment and Plan:  Sepsis secondary to Citrobacter Freundii UTI (HCC) and Citrobacter Bacteremia -UA grossly positive and presented with fever,  tachycardia and leukocytosis -Urinalysis showed large hemoglobin, moderate leukocytes, positive nitrites, many bacteria, greater than 50 RBCs per high-power field, greater than 50 WBCs with 0-5 squamous epithelial cells -Blood cultures x 2 obtained and showing gram-negative bacteremia -Urine cultures obtained and showing greater than 100,000 colony forming units of gram-negative rods -Continue with antibiotics with IV ceftriaxone and escalated to IV cefepime based on sensitivities but will change to po Bactrim now  -Continue with IV fluid hydration as below -Procalcitonin Level was 81.35 and will repeat -Lactic Acid Level Trend:  Recent Labs  Lab 06/11/23 1614  LATICACIDVEN 1.7  -WBC Trend: Recent Labs  Lab 05/17/23 1300 05/21/23 1430 06/11/23 1605 06/12/23 0552 06/13/23 0526 06/14/23 0543  WBC 10.2 14.6* 28.3* 25.3* 13.1* 7.6  -Repeat CBC in a.m. -Will need PT/OT to evaluate when improved   Acute Metabolic Encephalopathy and Agitation, which persisted despite antipsychotics -Secondary to sepsis from urinary tract infection and bacteremia -Continue to monitor with treatment above. Normally is responsive and independent on all ADLS.  -CT scan as below -Remains confused currently and slowly improvingand if not improving with treatment of his infection we will obtain a head imaging and discussion with neurology -Placed on delirium precautions -Continues to be Agitated and will not continue Lorazepam but will initiate IV Haloperidol 2 mg q6hprn; also added olanzapine and consulted psychiatry given his significant agitation -Discussed with neurology who recommends obtaining EEG in the a.m. -Continue with delirium precautions and TOC evaluate for referral for memory care unit -Psychiatry has now increased his Zyprexa and given a second dose and recommending continue to monitor -Will consider  obtaining MRI in the a.m. and will follow-up with the EEG -Patient has a one-to-one sitter    Bilateral Hydronephrosis -Likely secondary to chronic outlet obstruction with enlarged prostate -No retention on bladder scan  -CT Scan of the Abd/Pelvis done and showed "Mild bilateral hydronephrosis, new since the prior CT, likely related to chronic bladder outlet obstruction. No stone. Enlarged prostate gland with findings of chronic bladder outlet obstruction. Mild sigmoid diverticulosis. No bowel obstruction. Normal appendix. Aortic Atherosclerosis " -Had a Foley catheter placed and will discuss with urology and repeat a Renal Ultrasound in the a.m.   AKI/Acute Renal Failure (ARF) (HCC) -BUN/Cr Trend: Recent Labs  Lab 05/17/23 1300 05/21/23 1430 06/11/23 1605 06/12/23 0552 06/13/23 0526 06/14/23 0543  BUN 15 28* 66* 57* 36* 23  CREATININE 1.10 1.17 3.92* 2.06* 1.06 0.81  -Baseline appears to be around 1.1 and now is improved to his baseline -Renewed IV fluid hydration with lactated Ringer's for another 12 hours -Foley catheter has been placed -Avoid Nephrotoxic Medications, Contrast Dyes, Hypotension and Dehydration to Ensure Adequate Renal Perfusion and will need to Renally Adjust Meds -Continue to Monitor and Trend Renal Function carefully and repeat CMP in the AM    Metabolic Acidosis, normal anion gap (NAG) -Secondary to dehydration and acute renal failure -IV fluid hydration has now stopped -Patient's CO2 is now 23, anion gap is 10, chloride level is 108 -Continue monitor trend and repeat CMP in the a.m.  Thrombocytopenia -In the setting of infection and sepsis -Patient's platelet count  Trend: Recent Labs  Lab 05/17/23 1300 05/21/23 1430 06/11/23 1605 06/12/23 0552 06/13/23 0526 06/14/23 0543  PLT 435* 531* 139* 117* 118* 128*  -Continue to monitor and trend and repeat CBC in a.m.  Normocytic Anemia -Hgb/Hct Trend: Recent Labs  Lab 05/17/23 1300 05/21/23 1430 06/11/23 1605 06/12/23 0552 06/13/23 0526 06/14/23 0543  HGB 11.7* 11.0* 9.4* 9.1* 8.9* 9.9*   HCT 36.6* 34.1* 29.3* 28.5* 29.7* 31.2*  MCV 97.3 95.5 97.0 98.3 102.1* 97.2  -Checked Anemia Panel and showed an iron level of 69, UIBC of 79, TIBC 148, saturation ratios of 47%, Ferritin level 258, folate level 5.3 and vitamin B12 519 -Continue to Monitor for S/Sx of Bleeding; No overt bleeding noted -Repeat CBC in the AM   Hypokalemia -Patient's K+ Level Trend: Recent Labs  Lab 05/17/23 1300 05/21/23 1430 06/11/23 1605 06/12/23 0552 06/13/23 0526 06/14/23 0543  K 4.1 3.8 4.1 3.9 3.6 3.1*  -Replete with po KCL 40 mEQ -Continue to Monitor and Replete as Necessary -Repeat CMP in the AM   Seizure (HCC) -Keppra to be renally dose with acute renal failure -Change back to po Keppra and will continue 500 mg BID -Placed on seizure precautions -Checking EEG in the morning after discussion with neurology   Meningioma (HCC) -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 Redemonstrated vasogenic edema in the anterior right temporal lobe, and the right insula. Known lesion abutting the right cavernous sinus is poorly visualized due to CT technique. Consider further evaluation with contrast-enhanced brain MRI more definitive characterization." -May consider obtaining MRI if he is not agitated; will discuss with neuro-oncology in the a.m.  Hypoalbuminemia -Patient's Albumin Trend: Recent Labs  Lab 05/17/23 1300 05/21/23 1554 06/11/23 1605 06/13/23 0526 06/14/23 0543  ALBUMIN 2.8* 3.2* 2.6* 2.2* 2.4*  -Continue to Monitor and Trend and repeat CMP in the AM  DVT prophylaxis: enoxaparin (LOVENOX) injection 40 mg Start: 06/14/23 1000    Code Status: Full Code Family Communication: No family present at bedside  Disposition Plan:  Level of care: Telemetry Status is: Inpatient Remains inpatient appropriate because: Needs further clinical improvement and  evaluation by PT and OT   Consultants:  Psychiatry  Procedures:  As delineated as above  Antimicrobials:  Anti-infectives (From admission, onward)    Start     Dose/Rate Route Frequency Ordered Stop   06/14/23 2200  sulfamethoxazole-trimethoprim (BACTRIM DS) 800-160 MG per tablet 1 tablet        1 tablet Oral 2 times daily 06/14/23 1750     06/14/23 1400  ceFEPIme (MAXIPIME) 2 g in sodium chloride 0.9 % 100 mL IVPB  Status:  Discontinued        2 g 200 mL/hr over 30 Minutes Intravenous Every 8 hours 06/14/23 1228 06/14/23 1749   06/12/23 1600  cefTRIAXone (ROCEPHIN) 2 g in sodium chloride 0.9 % 100 mL IVPB  Status:  Discontinued        2 g 200 mL/hr over 30 Minutes Intravenous Every 24 hours 06/11/23 2104 06/14/23 1228   06/11/23 1615  cefTRIAXone (ROCEPHIN) 1 g in sodium chloride 0.9 % 100 mL IVPB  Status:  Discontinued        1 g 200 mL/hr over 30 Minutes Intravenous Every 24 hours 06/11/23 1603 06/11/23 1608   06/11/23 1615  cefTRIAXone (ROCEPHIN) 2 g in sodium chloride 0.9 % 100 mL IVPB        2 g 200 mL/hr over 30 Minutes Intravenous  Once 06/11/23 1608 06/11/23 1741       Subjective: Seen examined at bedside and he was little bit calmer this morning after I saw him but he was very agitated prior to my arrival and the nurse stated that he was wanting to leave.  He became combative and extremely agitated this afternoon and had to be given a second dose of olanzapine.  He is extremely hard of hearing but denies any other complaints or concerns.  Objective: Vitals:   06/13/23 1246 06/13/23 1928 06/14/23 0638 06/14/23 0729  BP: 137/76 (!) 151/76 (!) 150/74 (!) 145/70  Pulse: 72 64 72 74  Resp: 18 20 18 15   Temp:   98.2 F (36.8 C) 97.9 F (36.6 C)  TempSrc: Oral  Oral Oral  SpO2: 100% 90% 100% 100%  Weight:      Height:        Intake/Output Summary (Last 24 hours) at 06/14/2023 2052 Last data filed at 06/14/2023 1825 Gross per 24 hour  Intake 180 ml  Output 1450 ml   Net -1270 ml   Filed Weights   06/12/23 0902  Weight: 69.9 kg   Examination: Physical Exam:  Constitutional: Think hectic chronically ill-appearing elderly Caucasian male who is appears calm sitting in the chair at bedside Respiratory: Diminished to auscultation bilaterally, no wheezing, rales, rhonchi or crackles. Normal respiratory effort and patient is not tachypenic. No accessory muscle use.  Unlabored breathing Cardiovascular: RRR, no murmurs / rubs / gallops. S1 and S2 auscultated. No extremity edema.  Abdomen: Soft, non-tender, nondistended.. Bowel sounds positive.  GU: Deferred.  Foley catheter is in place Musculoskeletal: No clubbing / cyanosis of digits/nails. No joint deformity upper and lower extremities.  Skin: No rashes, lesions, ulcers on limited skin evaluation. No induration; Warm and dry.  Neurologic: CN 2-12 grossly intact with no focal deficits.  Romberg sign and cerebellar reflexes not assessed.  Psychiatric: Normal judgment and insight. Alert and oriented x 3. Normal mood and appropriate affect.   Data Reviewed: I have personally reviewed following labs and imaging studies  CBC: Recent Labs  Lab 06/11/23 1605 06/12/23 0552 06/13/23 0526 06/14/23 0543  WBC 28.3* 25.3* 13.1* 7.6  NEUTROABS 23.4*  --  11.2* 5.5  HGB 9.4* 9.1* 8.9* 9.9*  HCT 29.3* 28.5* 29.7* 31.2*  MCV 97.0 98.3 102.1* 97.2  PLT 139* 117* 118* 128*   Basic Metabolic Panel: Recent Labs  Lab 06/11/23 1605 06/12/23 0552 06/13/23 0526 06/14/23 0543  NA 141 144 144 141  K 4.1 3.9 3.6 3.1*  CL 113* 116* 113* 108  CO2 17* 19* 21* 23  GLUCOSE 119* 104* 73 77  BUN 66* 57* 36* 23  CREATININE 3.92* 2.06* 1.06 0.81  CALCIUM 8.3* 8.1* 8.5* 8.3*  MG  --   --  1.7 1.7  PHOS  --   --  2.5 2.9   GFR: Estimated Creatinine Clearance: 67.1 mL/min (by C-G formula based on SCr of 0.81 mg/dL). Liver Function Tests: Recent Labs  Lab 06/11/23 1605 06/13/23 0526 06/14/23 0543  AST 30 24 29    ALT 17 15 20   ALKPHOS 64 54 52  BILITOT 0.8 0.5 0.6  PROT 5.3* 4.9* 4.9*  ALBUMIN 2.6* 2.2* 2.4*   No results for input(s): "LIPASE", "AMYLASE" in the last 168 hours. No results for input(s): "AMMONIA" in the last 168 hours. Coagulation Profile: Recent Labs  Lab 06/11/23 1605  INR 1.7*   Cardiac Enzymes: No results for input(s): "CKTOTAL", "CKMB", "CKMBINDEX", "TROPONINI" in the last 168 hours. BNP (last 3 results) No results for input(s): "PROBNP" in the last 8760 hours. HbA1C: No results for input(s): "HGBA1C" in the last 72 hours. CBG: Recent Labs  Lab 06/11/23 1600 06/14/23 0732  GLUCAP 113* 63*   Lipid Profile: No results for input(s): "CHOL", "HDL", "LDLCALC", "TRIG", "CHOLHDL", "LDLDIRECT" in the last 72 hours. Thyroid Function Tests: No results for input(s): "TSH", "T4TOTAL", "FREET4", "T3FREE", "THYROIDAB" in the last 72 hours. Anemia Panel: Recent Labs    06/13/23 0526  VITAMINB12 519  FOLATE 5.3*  FERRITIN 258  TIBC 148*  IRON 69  RETICCTPCT 0.9   Sepsis Labs: Recent Labs  Lab 06/11/23 1614 06/13/23 0526  PROCALCITON  --  81.35  LATICACIDVEN 1.7  --     Recent Results (from the past 240 hour(s))  Resp panel by RT-PCR (RSV, Flu A&B, Covid) Anterior Nasal Swab     Status: None   Collection Time: 06/11/23  4:05 PM   Specimen: Anterior Nasal Swab  Result Value Ref Range Status   SARS Coronavirus 2 by RT PCR NEGATIVE NEGATIVE Final    Comment: (NOTE) SARS-CoV-2 target nucleic acids are NOT DETECTED.  The SARS-CoV-2 RNA is generally detectable in upper respiratory specimens during the acute phase of infection. The lowest concentration of SARS-CoV-2 viral copies this assay can detect is 138 copies/mL. A negative result does not preclude SARS-Cov-2 infection and should not be used as the sole basis for treatment or other patient management decisions. A negative result may occur with  improper specimen collection/handling, submission of specimen  other than nasopharyngeal swab, presence of viral mutation(s) within the areas targeted by this assay, and inadequate number of viral copies(<138 copies/mL). A negative result must be combined with clinical observations, patient history, and epidemiological information. The expected result is Negative.  Fact Sheet for Patients:  BloggerCourse.com  Fact Sheet for Healthcare Providers:  SeriousBroker.it  This  test is no t yet approved or cleared by the [place] and  has been authorized for detection and/or diagnosis of SARS-CoV-2 by FDA under an Emergency Use Authorization (EUA). This EUA will remain  in effect (meaning this test can be used) for the duration of the COVID-19 declaration under Section 564(b)(1) of the Act, 21 U.S.C.section 360bbb-3(b)(1), unless the authorization is terminated  or revoked sooner.       Influenza A by PCR NEGATIVE NEGATIVE Final   Influenza B by PCR NEGATIVE NEGATIVE Final    Comment: (NOTE) The Xpert Xpress SARS-CoV-2/FLU/RSV plus assay is intended as an aid in the diagnosis of influenza from Nasopharyngeal swab specimens and should not be used as a sole basis for treatment. Nasal washings and aspirates are unacceptable for Xpert Xpress SARS-CoV-2/FLU/RSV testing.  Fact Sheet for Patients: BloggerCourse.com  Fact Sheet for Healthcare Providers: SeriousBroker.it  This test is not yet approved or cleared by the [place] FDA and has been authorized for detection and/or diagnosis of SARS-CoV-2 by FDA under an Emergency Use Authorization (EUA). This EUA will remain in effect (meaning this test can be used) for the duration of the COVID-19 declaration under Section 564(b)(1) of the Act, 21 U.S.C. section 360bbb-3(b)(1), unless the authorization is terminated or revoked.     Resp Syncytial Virus by PCR NEGATIVE NEGATIVE Final     Comment: (NOTE) Fact Sheet for Patients: BloggerCourse.com  Fact Sheet for Healthcare Providers: SeriousBroker.it  This test is not yet approved or cleared by the [place] FDA and has been authorized for detection and/or diagnosis of SARS-CoV-2 by FDA under an Emergency Use Authorization (EUA). This EUA will remain in effect (meaning this test can be used) for the duration of the COVID-19 declaration under Section 564(b)(1) of the Act, 21 U.S.C. section 360bbb-3(b)(1), unless the authorization is terminated or revoked.  Performed at [facility], [address], [place], [place] [address]   Blood Culture (routine x 2)     Status: Abnormal   Collection Time: 06/11/23  4:05 PM   Specimen: BLOOD  Result Value Ref Range Status   Specimen Description   Final    BLOOD SITE NOT SPECIFIED Performed at [facility], [address], [place], [place] [address]    Special Requests   Final    BOTTLES DRAWN AEROBIC AND ANAEROBIC Blood Culture adequate volume Performed at [facility], [address] [place], [place] [address]    Culture  Setup Time   Final    GRAM NEGATIVE RODS IN BOTH AEROBIC AND ANAEROBIC BOTTLES CRITICAL RESULT CALLED TO, READ BACK BY AND VERIFIED WITH: PHARMD ANH PHAM [phone] [id number] BY Berline Chough, MT Performed at [facility], [address] [place], [place] [address]    Culture CITROBACTER FREUNDII (A)  Final   Report Status 06/14/2023 FINAL  Final   Organism ID, Bacteria CITROBACTER FREUNDII  Final      Susceptibility   Citrobacter freundii - MIC*    CEFEPIME <=0.12 SENSITIVE Sensitive     CEFTAZIDIME <=1 SENSITIVE Sensitive     CEFTRIAXONE <=0.25 SENSITIVE Sensitive     CIPROFLOXACIN <=0.25 SENSITIVE Sensitive     GENTAMICIN <=1 SENSITIVE Sensitive     IMIPENEM 0.5 SENSITIVE Sensitive     TRIMETH/SULFA <=20 SENSITIVE Sensitive     PIP/TAZO <=4  SENSITIVE Sensitive     * CITROBACTER FREUNDII  Blood Culture ID Panel (Reflexed)     Status: Abnormal  Collection Time: 06/11/23  4:05 PM  Result Value Ref Range Status   Enterococcus faecalis NOT DETECTED NOT DETECTED Final   Enterococcus Faecium NOT DETECTED NOT DETECTED Final   Listeria monocytogenes NOT DETECTED NOT DETECTED Final   Staphylococcus species NOT DETECTED NOT DETECTED Final   Staphylococcus aureus (BCID) NOT DETECTED NOT DETECTED Final   Staphylococcus epidermidis NOT DETECTED NOT DETECTED Final   Staphylococcus lugdunensis NOT DETECTED NOT DETECTED Final   Streptococcus species NOT DETECTED NOT DETECTED Final   Streptococcus agalactiae NOT DETECTED NOT DETECTED Final   Streptococcus pneumoniae NOT DETECTED NOT DETECTED Final   Streptococcus pyogenes NOT DETECTED NOT DETECTED Final   A.calcoaceticus-baumannii NOT DETECTED NOT DETECTED Final   Bacteroides fragilis NOT DETECTED NOT DETECTED Final   Enterobacterales DETECTED (A) NOT DETECTED Final    Comment: Enterobacterales represent a large order of gram negative bacteria, not a single organism. Refer to culture for further identification. CRITICAL RESULT CALLED TO, READ BACK BY AND VERIFIED WITH: PHARMD ANH PHAM [phone] [phone] BY J [id number], MT    Enterobacter cloacae complex NOT DETECTED NOT DETECTED Final   Escherichia coli NOT DETECTED NOT DETECTED Final   Klebsiella aerogenes NOT DETECTED NOT DETECTED Final   Klebsiella oxytoca NOT DETECTED NOT DETECTED Final   Klebsiella pneumoniae NOT DETECTED NOT DETECTED Final   Proteus species NOT DETECTED NOT DETECTED Final   Salmonella species NOT DETECTED NOT DETECTED Final   Serratia marcescens NOT DETECTED NOT DETECTED Final   Haemophilus influenzae NOT DETECTED NOT DETECTED Final   Neisseria meningitidis NOT DETECTED NOT DETECTED Final   Pseudomonas aeruginosa NOT DETECTED NOT DETECTED Final   Stenotrophomonas maltophilia NOT DETECTED NOT DETECTED Final   Candida  albicans NOT DETECTED NOT DETECTED Final   Candida auris NOT DETECTED NOT DETECTED Final   Candida glabrata NOT DETECTED NOT DETECTED Final   Candida krusei NOT DETECTED NOT DETECTED Final   Candida parapsilosis NOT DETECTED NOT DETECTED Final   Candida tropicalis NOT DETECTED NOT DETECTED Final   Cryptococcus neoformans/gattii NOT DETECTED NOT DETECTED Final   CTX-M ESBL NOT DETECTED NOT DETECTED Final   Carbapenem resistance IMP NOT DETECTED NOT DETECTED Final   Carbapenem resistance KPC NOT DETECTED NOT DETECTED Final   Carbapenem resistance NDM NOT DETECTED NOT DETECTED Final   Carbapenem resist OXA 48 LIKE NOT DETECTED NOT DETECTED Final   Carbapenem resistance VIM NOT DETECTED NOT DETECTED Final    Comment: Performed at [facility], [address], [place], [place] [address]  Blood Culture (routine x 2)     Status: None (Preliminary result)   Collection Time: 06/11/23  4:08 PM   Specimen: BLOOD LEFT FOREARM  Result Value Ref Range Status   Specimen Description   Final    BLOOD LEFT FOREARM Performed at [facility], [address], [place], [place] [address]    Special Requests   Final    BOTTLES DRAWN AEROBIC AND ANAEROBIC Blood Culture adequate volume Performed at [facility], [address] [place], [place] [address]    Culture  Setup Time   Final    GRAM NEGATIVE RODS IN BOTH AEROBIC AND ANAEROBIC BOTTLES CRITICAL VALUE NOTED.  VALUE IS CONSISTENT WITH PREVIOUSLY REPORTED AND CALLED VALUE. Performed at [facility], [address] [place], [place] [address]    Culture GRAM NEGATIVE RODS  Final   Report Status PENDING  Incomplete  Urine Culture     Status: Abnormal   Collection  Time: 06/11/23  6:14 PM   Specimen: Urine, Random  Result Value Ref Range Status   Specimen Description   Final    URINE, RANDOM Performed at [facility], [address], [place], [place] [address]    Special Requests   Final     NONE Reflexed from [id number] Performed at [facility], [address], [place], [place] [address]    Culture >=100,000 COLONIES/mL CITROBACTER FREUNDII (A)  Final   Report Status 06/13/2023 FINAL  Final   Organism ID, Bacteria CITROBACTER FREUNDII (A)  Final      Susceptibility   Citrobacter freundii - MIC*    CEFEPIME <=0.12 SENSITIVE Sensitive     CEFTRIAXONE <=0.25 SENSITIVE Sensitive     CIPROFLOXACIN <=0.25 SENSITIVE Sensitive     GENTAMICIN <=1 SENSITIVE Sensitive     IMIPENEM 1 SENSITIVE Sensitive     NITROFURANTOIN <=16 SENSITIVE Sensitive     TRIMETH/SULFA <=20 SENSITIVE Sensitive     PIP/TAZO <=4 SENSITIVE Sensitive     * >=100,000 COLONIES/mL CITROBACTER FREUNDII    Radiology Studies: No results found.  Scheduled Meds:  Chlorhexidine Gluconate Cloth  6 each Topical Daily   enoxaparin (LOVENOX) injection  40 mg Subcutaneous Q24H   levETIRAcetam  500 mg Oral BID   levothyroxine  50 mcg Oral Q0600   multivitamin with minerals  1 tablet Oral Daily   [START ON 06/15/2023] OLANZapine zydis  5 mg Oral QHS   potassium chloride  40 mEq Oral BID   sodium chloride  1 g Oral BID WC   sulfamethoxazole-trimethoprim  1 tablet Oral BID   Continuous Infusions:  magnesium sulfate bolus IVPB      LOS: 3 days   Marguerita Merles, DO [facility] Available via Epic secure chat 7am-7pm After these hours, please refer to coverage provider listed on [email or web address] 06/14/2023, 8:52 PM

## 2023-06-14 NOTE — Progress Notes (Signed)
   06/14/23 1143  TOC Brief Assessment  Insurance and Status Reviewed  Patient has primary care physician Yes  Home environment has been reviewed Home w/ spouse  Prior level of function: Independent  Prior/Current Home Services No current home services  Social Determinants of Health Reivew SDOH reviewed no interventions necessary  Readmission risk has been reviewed Yes  Transition of care needs no transition of care needs at this time

## 2023-06-14 NOTE — Progress Notes (Signed)
Mobility Specialist - Progress Note   06/14/23 0945  Mobility  Activity Ambulated with assistance in hallway  Level of Assistance Standby assist, set-up cues, supervision of patient - no hands on  Assistive Device None  Distance Ambulated (ft) 200 ft  Range of Motion/Exercises Active  Activity Response Tolerated well  Mobility Referral Yes  $Mobility charge 1 Mobility  Mobility Specialist Start Time (ACUTE ONLY) 0910  Mobility Specialist Stop Time (ACUTE ONLY) 0930  Mobility Specialist Time Calculation (min) (ACUTE ONLY) 20 min   Pt received halfway out of bed with bed alarm going off, was attempting to redirect pt back to bed. Was not working, needed RN x2 assist to convince pt to return to bed.  Left in bed with all needs met.  Marilynne Halsted Mobility Specialist
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
## 2023-06-15 ENCOUNTER — Inpatient Hospital Stay (HOSPITAL_COMMUNITY): Payer: PPO

## 2023-06-15 ENCOUNTER — Inpatient Hospital Stay (HOSPITAL_COMMUNITY)
Admit: 2023-06-15 | Discharge: 2023-06-15 | Disposition: A | Discharge status: Home / Self Care | Payer: PPO | Attending: Internal Medicine | Admitting: Internal Medicine

## 2023-06-15 DIAGNOSIS — N1330 Unspecified hydronephrosis: Secondary | ICD-10-CM | POA: Diagnosis not present

## 2023-06-15 DIAGNOSIS — G9341 Metabolic encephalopathy: Secondary | ICD-10-CM | POA: Diagnosis not present

## 2023-06-15 DIAGNOSIS — R4182 Altered mental status, unspecified: Secondary | ICD-10-CM

## 2023-06-15 DIAGNOSIS — R569 Unspecified convulsions: Secondary | ICD-10-CM | POA: Diagnosis not present

## 2023-06-15 DIAGNOSIS — N179 Acute kidney failure, unspecified: Secondary | ICD-10-CM | POA: Diagnosis not present

## 2023-06-15 DIAGNOSIS — A419 Sepsis, unspecified organism: Secondary | ICD-10-CM | POA: Diagnosis not present

## 2023-06-15 DIAGNOSIS — R55 Syncope and collapse: Secondary | ICD-10-CM

## 2023-06-15 LAB — CBC WITH DIFFERENTIAL/PLATELET
Abs Immature Granulocytes: 0.05 10*3/uL (ref 0.00–0.07)
Basophils Absolute: 0 10*3/uL (ref 0.0–0.1)
Basophils Relative: 0 %
Eosinophils Absolute: 0.3 10*3/uL (ref 0.0–0.5)
Eosinophils Relative: 3 %
HCT: 34.1 % — ABNORMAL LOW (ref 39.0–52.0)
Hemoglobin: 10.5 g/dL — ABNORMAL LOW (ref 13.0–17.0)
Immature Granulocytes: 1 %
Lymphocytes Relative: 18 %
Lymphs Abs: 1.6 10*3/uL (ref 0.7–4.0)
MCH: 30.3 pg (ref 26.0–34.0)
MCHC: 30.8 g/dL (ref 30.0–36.0)
MCV: 98.3 fL (ref 80.0–100.0)
Monocytes Absolute: 0.7 10*3/uL (ref 0.1–1.0)
Monocytes Relative: 8 %
Neutro Abs: 6.3 10*3/uL (ref 1.7–7.7)
Neutrophils Relative %: 70 %
Platelets: 160 10*3/uL (ref 150–400)
RBC: 3.47 MIL/uL — ABNORMAL LOW (ref 4.22–5.81)
RDW: 13.7 % (ref 11.5–15.5)
WBC: 9.1 10*3/uL (ref 4.0–10.5)
nRBC: 0 % (ref 0.0–0.2)

## 2023-06-15 LAB — COMPREHENSIVE METABOLIC PANEL
ALT: 17 U/L (ref 0–44)
AST: 22 U/L (ref 15–41)
Albumin: 2.7 g/dL — ABNORMAL LOW (ref 3.5–5.0)
Alkaline Phosphatase: 52 U/L (ref 38–126)
Anion gap: 10 (ref 5–15)
BUN: 18 mg/dL (ref 8–23)
CO2: 21 mmol/L — ABNORMAL LOW (ref 22–32)
Calcium: 7.8 mg/dL — ABNORMAL LOW (ref 8.9–10.3)
Chloride: 107 mmol/L (ref 98–111)
Creatinine, Ser: 0.98 mg/dL (ref 0.61–1.24)
GFR, Estimated: >60 mL/min (ref >60)
Glucose, Bld: 150 mg/dL — ABNORMAL HIGH (ref 70–99)
Potassium: 3.5 mmol/L (ref 3.5–5.1)
Sodium: 138 mmol/L (ref 135–145)
Total Bilirubin: 1 mg/dL (ref 0.3–1.2)
Total Protein: 5.4 g/dL — ABNORMAL LOW (ref 6.5–8.1)

## 2023-06-15 LAB — PHOSPHORUS: Phosphorus: 2.9 mg/dL (ref 2.5–4.6)

## 2023-06-15 LAB — GLUCOSE, CAPILLARY: Glucose-Capillary: 150 mg/dL — ABNORMAL HIGH (ref 70–99)

## 2023-06-15 LAB — MAGNESIUM: Magnesium: 1.4 mg/dL — ABNORMAL LOW (ref 1.7–2.4)

## 2023-06-15 MED ORDER — MAGNESIUM SULFATE 4 GM/100ML IV SOLN
4.0000 g | Freq: Once | INTRAVENOUS | Status: AC
  Administered 2023-06-15: 4 g via INTRAVENOUS
  Filled 2023-06-15 (×3): qty 100

## 2023-06-15 MED ORDER — SODIUM CHLORIDE 0.9 % IV BOLUS
1000.0000 mL | Freq: Once | INTRAVENOUS | Status: AC
  Administered 2023-06-15: 1000 mL via INTRAVENOUS

## 2023-06-15 MED ORDER — OLANZAPINE 5 MG PO TBDP
2.5000 mg | ORAL_TABLET | Freq: Every day | ORAL | Status: DC
  Administered 2023-06-15 – 2023-06-21 (×6): 2.5 mg via ORAL
  Filled 2023-06-15 (×7): qty 0.5

## 2023-06-15 MED ORDER — POTASSIUM CHLORIDE CRYS ER 20 MEQ PO TBCR
40.0000 meq | EXTENDED_RELEASE_TABLET | Freq: Two times a day (BID) | ORAL | Status: AC
  Administered 2023-06-15 – 2023-06-16 (×2): 40 meq via ORAL
  Filled 2023-06-15 (×3): qty 2

## 2023-06-15 NOTE — Progress Notes (Signed)
EEG complete - results pending 

## 2023-06-15 NOTE — Progress Notes (Addendum)
PROGRESS NOTE    Aaron Hickman  ZHY:[id number] DOB: 1936-08-27 DOA: 06/11/2023 PCP: [facility]   Brief Narrative:  The patient is a 87 year old Caucasian male with past medical history significant for but limited to medial sphenoid meningioma status postradiation, chronic vasogenic edema who has been noncompliant with steroids, seizures, BPH as well as other comorbidities who presented with altered mental status.  Patient was arousable to light noxious stimuli on the time of admission and would push away the admitted hand but otherwise remained asleep.  Admitted spoke with the wife over the phone who provided limited history and she stated for last few days he has been sleepy and not wanting to do anything.  Normally does everything on his own has not been eating or drinking or taking any medications including his antiepileptics.  He had a urinary tract infection that was treated 2 weeks ago and he has a history of urinary retention requiring Foley catheter but the wife thinks that he was frequently urinating.  He came to the ED and was found to be febrile, tachycardic and a leukocytosis of 28.3.  Also had a metabolic acidosis and UA is grossly positive for moderate leukocytes positive nitrites and many bacteria.  He was started on fluids and antibiotics and admitted for sepsis and also found to have a gram-negative bacteremia and Citrobacter Freundii UTI  that is pan-sensitive.   Since AKI is improving but patient starting to have some behavioral issues and agitation so he was initiated on Haldol and also added olanzapine.  IV for hydration has now stopped and will repeat a renal ultrasound in the AM.  Psychiatry has been consulted for his agitation and recommended continuing current interventions adding a second dose of Zyprexa.  Given that he continued have some agitated behaviors psychiatry recommending getting neurology involved and weaning off the Keppra.  Case was  discussed with neuro-oncology as well as neurology and oncology feels that the patient's meningioma is unlikely the cause of his significant behavioral disturbances but is recommending transitioning off of the Keppra as this could worsen the behavioral disturbances.  Neuro-oncology recommends getting neurology on board and neurology has been consulted and recommending transfer to [facility] for further evaluation and adjustments of his antiepileptics and for continuous EEG monitoring.  Assessment and Plan:  Sepsis Secondary to Citrobacter Freundii UTI (HCC) and Citrobacter Bacteremia, improving -UA grossly positive and presented with fever, tachycardia and leukocytosis -Urinalysis showed large hemoglobin, moderate leukocytes, positive nitrites, many bacteria, greater than 50 RBCs per high-power field, greater than 50 WBCs with 0-5 squamous epithelial cells -Blood cultures x 2 obtained and showing gram-negative bacteremia -Urine cultures obtained and showing greater than 100,000 colony forming units of gram-negative rods -Continue with antibiotics with IV ceftriaxone and escalated to IV cefepime based on sensitivities but will change to po Bactrim and has been on Bactrim since the evening of 06/14/2023 -Continue with IV fluid hydration as below -Procalcitonin Level was 81.35 and will repeat -Lactic Acid Level Trend:  Recent Labs  Lab 06/11/23 1614  LATICACIDVEN 1.7  -WBC Trend: Recent Labs  Lab 05/17/23 1300 05/21/23 1430 06/11/23 1605 06/12/23 0552 06/13/23 0526 06/14/23 0543  WBC 10.2 14.6* 28.3* 25.3* 13.1* 7.6  -Repeat CBC in a.m. -Will need PT/OT to evaluate and they are recommending home health   Acute Metabolic Encephalopathy and Agitation, which persisted despite antipsychotics -Secondary to sepsis from urinary tract infection and bacteremia however he likely has some underlying dementia and disease given that  his family was possibly even trying to place him in a memory care  unit -Continue to monitor with treatment above. Normally is responsive and independent on all ADLS.  -CT scan as below -Remains confused currently and slowly improvingand if not improving with treatment of his infection we discussed with neurology and they are recommending transferring to [facility] for continued EEG monitoring and adjustment of his antiepileptics -Placed on delirium precautions -Continues to be Agitated and will not continue Lorazepam but will initiate IV Haloperidol 2 mg q6hprn; also added olanzapine and consulted psychiatry given his significant agitation -Discussed with neurology who recommends obtaining EEG and this was done; EEG showed that is suggestive of cortical dysfunction arising from right temporoparietal region which is likely secondary to underlying structural abnormality with no seizures or epileptiform discharges seen throughout the recording -Continue with delirium precautions and TOC evaluate for referral for memory care unit -Psychiatry has now increased his Zyprexa and given a second dose and recommending continue to monitor -After further discussion with psychiatry they recommended possibly changing his antiepileptics from Keppra as it may be worsening his agitation.  Psychiatry recommended getting neurology involved.  I discussed the case with neuro-oncology and they deferred to neurology to change his antiepileptics.  Discussed with Dr. Selina Cooley who recommends transfer to [facility] for continuous EEG monitoring for now and will see the patient when he gets to: Further adjustments of his antiepileptics and transitioning off of the Keppra -Patient has a one-to-one sitter   Bilateral Hydronephrosis -Likely secondary to chronic outlet obstruction with enlarged prostate -No retention on bladder scan  -CT Scan of the Abd/Pelvis done and showed "Mild bilateral hydronephrosis, new since the prior CT, likely related to chronic bladder outlet obstruction. No stone.  Enlarged prostate gland with findings of chronic bladder outlet obstruction. Mild sigmoid diverticulosis. No bowel obstruction. Normal appendix. Aortic Atherosclerosis " -Had a Foley catheter placed and will discuss with urology and repeat a Renal Ultrasound in the a.m. -Consider trial of void prior to discharge   AKI/Acute Renal Failure (ARF) (HCC) -BUN/Cr Trend: Recent Labs  Lab 05/17/23 1300 05/21/23 1430 06/11/23 1605 06/12/23 0552 06/13/23 0526 06/14/23 0543  BUN 15 28* 66* 57* 36* 23  CREATININE 1.10 1.17 3.92* 2.06* 1.06 0.81  -Baseline appears to be around 1.1 and now is improved to his baseline -Renewed IV fluid hydration with lactated Ringer's for another 12 hours -Foley catheter has been placed -Avoid Nephrotoxic Medications, Contrast Dyes, Hypotension and Dehydration to Ensure Adequate Renal Perfusion and will need to Renally Adjust Meds -Continue to Monitor and Trend Renal Function carefully and repeat CMP in the AM    Metabolic Acidosis, normal anion gap (NAG) -Secondary to dehydration and acute renal failure -IV fluid hydration has now stopped but given another bolus given that he had a syncopal episode -Patient's CO2 is now 21, anion gap is 10, chloride level is 107 -Continue monitor trend and repeat CMP in the a.m.  Thrombocytopenia, improved -In the setting of infection and sepsis -Patient's platelet count  Trend: Recent Labs  Lab 05/17/23 1300 05/21/23 1430 06/11/23 1605 06/12/23 0552 06/13/23 0526 06/14/23 0543  PLT 435* 531* 139* 117* 118* 128*  -Continue to monitor and trend and repeat CBC in a.m.  Normocytic Anemia -Hgb/Hct Trend: Recent Labs  Lab 05/17/23 1300 05/21/23 1430 06/11/23 1605 06/12/23 0552 06/13/23 0526 06/14/23 0543  HGB 11.7* 11.0* 9.4* 9.1* 8.9* 9.9*  HCT 36.6* 34.1* 29.3* 28.5* 29.7* 31.2*  MCV 97.3 95.5 97.0 98.3 102.1*  97.2  -Checked Anemia Panel and showed an iron level of 69, UIBC of 79, TIBC 148, saturation ratios of  47%, Ferritin level 258, folate level 5.3 and vitamin B12 519 -Continue to Monitor for S/Sx of Bleeding; No overt bleeding noted -Repeat CBC in the AM   Hypokalemia -Patient's K+ Level Trend: Recent Labs  Lab 05/17/23 1300 05/21/23 1430 06/11/23 1605 06/12/23 0552 06/13/23 0526 06/14/23 0543  K 4.1 3.8 4.1 3.9 3.6 3.1*  -Replete with po KCL 40 mEQ x 2 -Continue to Monitor and Replete as Necessary -Repeat CMP in the AM   Seizure (HCC) -Keppra to be renally dose with acute renal failure but resumed and changed back to po Keppra and will continue 500 mg BID but he is having significant behavioral disturbances and yesterday was try to bite and fight the nurses.  He is a bit more subdued and I spoke with psychiatry and psychiatry feels that this is refractory to antipsychotic medications and recommending discussing with neurology -Placed on seizure precautions -I discussed the case with neurology and psychiatry and they feel the best option would be to transition him from Keppra to Lamictal or Depakote but the neurologist wants to make sure that he is not having seizures during his behavioral episodes and recommending transferring him to [facility] for further evaluation and continued EEG monitoring and then transitioning off of the Keppra to other Lamictal or Depakote to help with his agitation and mood  Hypomagnesemia -Patient's Mag Level Trend: Recent Labs  Lab 05/17/23 1300 06/13/23 0526 06/14/23 0543 06/15/23 1115  MG 1.7 1.7 1.7 1.4*  -Replete with IV mag sulfate 4 g -Continue to Monitor and Replete as Necessary -Repeat Mag in the AM   Vasovagal Syncope -Ambulated and then passed out today and Rapid was Called -BP dropped to 56/44 -Had to be placed in Trendelenberg -Given a 1 Liter bolus  -Continue with PT and OT and will need that when he gets to [facility] -Check Orthostatic VS in the AM   Meningioma (HCC) -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 Redemonstrated vasogenic edema in the anterior right temporal lobe, and the right insula. Known lesion abutting the right cavernous sinus is poorly visualized due to CT technique. Consider further evaluation with contrast-enhanced brain MRI more definitive characterization." -May consider obtaining MRI if he is not agitated but I discussed the case with neuro-oncology Dr. Barbaraann Cao and he feels that this is chronic and recommends no further imaging  Hypoalbuminemia -Patient's Albumin Trend: Recent Labs  Lab 05/17/23 1300 05/21/23 1554 06/11/23 1605 06/13/23 0526 06/14/23 0543  ALBUMIN 2.8* 3.2* 2.6* 2.2* 2.4*  -Continue to Monitor and Trend and repeat CMP in the AM   DVT prophylaxis: Place TED hose Start: 06/15/23 1119 enoxaparin (LOVENOX) injection 40 mg Start: 06/14/23 1000    Code Status: Full Code Family Communication: Discussed with the wife over the telephone  Disposition Plan:  Level of care: Telemetry Medical Status is: Inpatient Remains inpatient appropriate because: Needs further clinical improvement and clearance by the specialists and will be going to [facility] for continuous EEG monitoring  Consultants:  Psychiatry Neurology  Procedures:  As delineated as above  EEG This study is suggestive of cortical dysfunction arising from right temporo-parietal region, likely secondary to underlying structural abnormality. No seizures or epileptiform discharges were seen throughout the recording.   Antimicrobials:  Anti-infectives (From admission,  onward)    Start     Dose/Rate Route Frequency Ordered Stop   06/14/23 2200  sulfamethoxazole-trimethoprim (BACTRIM DS) 800-160 MG per tablet 1 tablet        1 tablet Oral 2 times daily 06/14/23 1750     06/14/23 1400  ceFEPIme (MAXIPIME) 2 g in sodium chloride 0.9 % 100 mL IVPB  Status:  Discontinued        2 g 200 mL/hr  over 30 Minutes Intravenous Every 8 hours 06/14/23 1228 06/14/23 1749   06/12/23 1600  cefTRIAXone (ROCEPHIN) 2 g in sodium chloride 0.9 % 100 mL IVPB  Status:  Discontinued        2 g 200 mL/hr over 30 Minutes Intravenous Every 24 hours 06/11/23 2104 06/14/23 1228   06/11/23 1615  cefTRIAXone (ROCEPHIN) 1 g in sodium chloride 0.9 % 100 mL IVPB  Status:  Discontinued        1 g 200 mL/hr over 30 Minutes Intravenous Every 24 hours 06/11/23 1603 06/11/23 1608   06/11/23 1615  cefTRIAXone (ROCEPHIN) 2 g in sodium chloride 0.9 % 100 mL IVPB        2 g 200 mL/hr over 30 Minutes Intravenous  Once 06/11/23 1608 06/11/23 1741       Subjective: Seen and examined at bedside and he had just had a rapid response and was hypotensive and not as responsive but then came to.  He was seen in the Trendelenburg position is extremely hard of hearing.  Nursing states that he was ambulating with the therapists and when he got to his room they asked him if he wanted to go to the bathroom and he said yes but then he became unresponsive and had nystagmus in both eyes before he closed them and put in bed. He woke up and started answering questions more appropriately.   Objective: Vitals:   06/15/23 1124 06/15/23 1310 06/15/23 1408 06/15/23 1600  BP: 130/70 (!) 172/80 (!) 167/84 (!) 146/83  Pulse: 82 69 63 75  Resp:  15    Temp:      TempSrc:      SpO2:  99%    Weight:      Height:        Intake/Output Summary (Last 24 hours) at 06/15/2023 1913 Last data filed at 06/15/2023 1116 Gross per 24 hour  Intake 355 ml  Output 950 ml  Net -595 ml   Filed Weights   06/12/23 0902  Weight: 69.9 kg   Examination: Physical Exam:  Constitutional: Thin chronically ill-appearing Caucasian male who was laying in bed calm Respiratory: Diminished to auscultation bilaterally, no wheezing, rales, rhonchi or crackles. Normal respiratory effort and patient is not tachypenic. No accessory muscle use. Unlabored breathing   Cardiovascular: RRR, no murmurs / rubs / gallops. S1 and S2 auscultated. Mild 1+ LE Edema  Abdomen: Soft, non-tender, non-distended. Bowel sounds positive.  GU: Deferred. Musculoskeletal: No clubbing / cyanosis of digits/nails. No joint deformity upper and lower extremities.  Skin: No rashes, lesions, ulcers on a limited skin evaluation. No induration; Warm and dry.  Neurologic: Was a little difficult to arouse initially but then became more alert but is extremely hard of hearing  Psychiatric: Drowsy. Appears calm after his rapid response  Data Reviewed: I have personally reviewed following labs and imaging studies  CBC: Recent Labs  Lab 06/11/23 1605 06/12/23 0552 06/13/23 0526 06/14/23 0543 06/15/23 1115  WBC 28.3* 25.3* 13.1* 7.6 9.1  NEUTROABS 23.4*  --  11.2*  5.5 6.3  HGB 9.4* 9.1* 8.9* 9.9* 10.5*  HCT 29.3* 28.5* 29.7* 31.2* 34.1*  MCV 97.0 98.3 102.1* 97.2 98.3  PLT 139* 117* 118* 128* 160   Basic Metabolic Panel: Recent Labs  Lab 06/11/23 1605 06/12/23 0552 06/13/23 0526 06/14/23 0543 06/15/23 1115  NA 141 144 144 141 138  K 4.1 3.9 3.6 3.1* 3.5  CL 113* 116* 113* 108 107  CO2 17* 19* 21* 23 21*  GLUCOSE 119* 104* 73 77 150*  BUN 66* 57* 36* 23 18  CREATININE 3.92* 2.06* 1.06 0.81 0.98  CALCIUM 8.3* 8.1* 8.5* 8.3* 7.8*  MG  --   --  1.7 1.7 1.4*  PHOS  --   --  2.5 2.9 2.9   GFR: Estimated Creatinine Clearance: 55.5 mL/min (by C-G formula based on SCr of 0.98 mg/dL). Liver Function Tests: Recent Labs  Lab 06/11/23 1605 06/13/23 0526 06/14/23 0543 06/15/23 1115  AST 30 24 29 22   ALT 17 15 20 17   ALKPHOS 64 54 52 52  BILITOT 0.8 0.5 0.6 1.0  PROT 5.3* 4.9* 4.9* 5.4*  ALBUMIN 2.6* 2.2* 2.4* 2.7*   No results for input(s): "LIPASE", "AMYLASE" in the last 168 hours. No results for input(s): "AMMONIA" in the last 168 hours. Coagulation Profile: Recent Labs  Lab 06/11/23 1605  INR 1.7*   Cardiac Enzymes: No results for input(s): "CKTOTAL",  "CKMB", "CKMBINDEX", "TROPONINI" in the last 168 hours. BNP (last 3 results) No results for input(s): "PROBNP" in the last 8760 hours. HbA1C: No results for input(s): "HGBA1C" in the last 72 hours. CBG: Recent Labs  Lab 06/11/23 1600 06/14/23 0732 06/15/23 1107  GLUCAP 113* 63* 150*   Lipid Profile: No results for input(s): "CHOL", "HDL", "LDLCALC", "TRIG", "CHOLHDL", "LDLDIRECT" in the last 72 hours. Thyroid Function Tests: No results for input(s): "TSH", "T4TOTAL", "FREET4", "T3FREE", "THYROIDAB" in the last 72 hours. Anemia Panel: Recent Labs    06/13/23 0526  VITAMINB12 519  FOLATE 5.3*  FERRITIN 258  TIBC 148*  IRON 69  RETICCTPCT 0.9   Sepsis Labs: Recent Labs  Lab 06/11/23 1614 06/13/23 0526  PROCALCITON  --  81.35  LATICACIDVEN 1.7  --     Recent Results (from the past 240 hour(s))  Resp panel by RT-PCR (RSV, Flu A&B, Covid) Anterior Nasal Swab     Status: None   Collection Time: 06/11/23  4:05 PM   Specimen: Anterior Nasal Swab  Result Value Ref Range Status   SARS Coronavirus 2 by RT PCR NEGATIVE NEGATIVE Final    Comment: (NOTE) SARS-CoV-2 target nucleic acids are NOT DETECTED.  The SARS-CoV-2 RNA is generally detectable in upper respiratory specimens during the acute phase of infection. The lowest concentration of SARS-CoV-2 viral copies this assay can detect is 138 copies/mL. A negative result does not preclude SARS-Cov-2 infection and should not be used as the sole basis for treatment or other patient management decisions. A negative result may occur with  improper specimen collection/handling, submission of specimen other than nasopharyngeal swab, presence of viral mutation(s) within the areas targeted by this assay, and inadequate number of viral copies(<138 copies/mL). A negative result must be combined with clinical observations, patient history, and epidemiological information. The expected result is Negative.  Fact Sheet for Patients:   BloggerCourse.com  Fact Sheet for Healthcare Providers:  SeriousBroker.it  This test is no t yet approved or cleared by the [place] FDA and  has been authorized for detection and/or diagnosis of SARS-CoV-2 by FDA under  an Emergency Use Authorization (EUA). This EUA will remain  in effect (meaning this test can be used) for the duration of the COVID-19 declaration under Section 564(b)(1) of the Act, 21 U.S.C.section 360bbb-3(b)(1), unless the authorization is terminated  or revoked sooner.       Influenza A by PCR NEGATIVE NEGATIVE Final   Influenza B by PCR NEGATIVE NEGATIVE Final    Comment: (NOTE) The Xpert Xpress SARS-CoV-2/FLU/RSV plus assay is intended as an aid in the diagnosis of influenza from Nasopharyngeal swab specimens and should not be used as a sole basis for treatment. Nasal washings and aspirates are unacceptable for Xpert Xpress SARS-CoV-2/FLU/RSV testing.  Fact Sheet for Patients: BloggerCourse.com  Fact Sheet for Healthcare Providers: SeriousBroker.it  This test is not yet approved or cleared by the [place] FDA and has been authorized for detection and/or diagnosis of SARS-CoV-2 by FDA under an Emergency Use Authorization (EUA). This EUA will remain in effect (meaning this test can be used) for the duration of the COVID-19 declaration under Section 564(b)(1) of the Act, 21 U.S.C. section 360bbb-3(b)(1), unless the authorization is terminated or revoked.     Resp Syncytial Virus by PCR NEGATIVE NEGATIVE Final    Comment: (NOTE) Fact Sheet for Patients: BloggerCourse.com  Fact Sheet for Healthcare Providers: SeriousBroker.it  This test is not yet approved or cleared by the [place] FDA and has been authorized for detection and/or diagnosis of SARS-CoV-2 by FDA under an Emergency Use  Authorization (EUA). This EUA will remain in effect (meaning this test can be used) for the duration of the COVID-19 declaration under Section 564(b)(1) of the Act, 21 U.S.C. section 360bbb-3(b)(1), unless the authorization is terminated or revoked.  Performed at [facility], [address] [place], [place] [address]   Blood Culture (routine x 2)     Status: Abnormal   Collection Time: 06/11/23  4:05 PM   Specimen: BLOOD  Result Value Ref Range Status   Specimen Description   Final    BLOOD SITE NOT SPECIFIED Performed at [facility], [address] [place], [place] [address]    Special Requests   Final    BOTTLES DRAWN AEROBIC AND ANAEROBIC Blood Culture adequate volume Performed at [facility], [address] [place], [place] [address]    Culture  Setup Time   Final    GRAM NEGATIVE RODS IN BOTH AEROBIC AND ANAEROBIC BOTTLES CRITICAL RESULT CALLED TO, READ BACK BY AND VERIFIED WITH: PHARMD ANH PHAM [phone] [id number] BY Berline Chough, [place] Performed at [facility], [address], [place], [place] [address]    Culture CITROBACTER FREUNDII (A)  Final   Report Status 06/14/2023 FINAL  Final   Organism ID, Bacteria CITROBACTER FREUNDII  Final      Susceptibility   Citrobacter freundii - MIC*    CEFEPIME <=0.12 SENSITIVE Sensitive     CEFTAZIDIME <=1 SENSITIVE Sensitive     CEFTRIAXONE <=0.25 SENSITIVE Sensitive     CIPROFLOXACIN <=0.25 SENSITIVE Sensitive     GENTAMICIN <=1 SENSITIVE Sensitive     IMIPENEM 0.5 SENSITIVE Sensitive     TRIMETH/SULFA <=20 SENSITIVE Sensitive     PIP/TAZO <=4 SENSITIVE Sensitive     * CITROBACTER FREUNDII  Blood Culture ID Panel (Reflexed)     Status: Abnormal   Collection Time: 06/11/23  4:05 PM  Result Value Ref Range Status   Enterococcus faecalis NOT DETECTED NOT DETECTED Final   Enterococcus Faecium NOT DETECTED NOT  DETECTED Final   Listeria monocytogenes NOT DETECTED NOT DETECTED  Final   Staphylococcus species NOT DETECTED NOT DETECTED Final   Staphylococcus aureus (BCID) NOT DETECTED NOT DETECTED Final   Staphylococcus epidermidis NOT DETECTED NOT DETECTED Final   Staphylococcus lugdunensis NOT DETECTED NOT DETECTED Final   Streptococcus species NOT DETECTED NOT DETECTED Final   Streptococcus agalactiae NOT DETECTED NOT DETECTED Final   Streptococcus pneumoniae NOT DETECTED NOT DETECTED Final   Streptococcus pyogenes NOT DETECTED NOT DETECTED Final   A.calcoaceticus-baumannii NOT DETECTED NOT DETECTED Final   Bacteroides fragilis NOT DETECTED NOT DETECTED Final   Enterobacterales DETECTED (A) NOT DETECTED Final    Comment: Enterobacterales represent a large order of gram negative bacteria, not a single organism. Refer to culture for further identification. CRITICAL RESULT CALLED TO, READ BACK BY AND VERIFIED WITH: PHARMD ANH PHAM [phone] [id number] BY J [id number], MT    Enterobacter cloacae complex NOT DETECTED NOT DETECTED Final   Escherichia coli NOT DETECTED NOT DETECTED Final   Klebsiella aerogenes NOT DETECTED NOT DETECTED Final   Klebsiella oxytoca NOT DETECTED NOT DETECTED Final   Klebsiella pneumoniae NOT DETECTED NOT DETECTED Final   Proteus species NOT DETECTED NOT DETECTED Final   Salmonella species NOT DETECTED NOT DETECTED Final   Serratia marcescens NOT DETECTED NOT DETECTED Final   Haemophilus influenzae NOT DETECTED NOT DETECTED Final   Neisseria meningitidis NOT DETECTED NOT DETECTED Final   Pseudomonas aeruginosa NOT DETECTED NOT DETECTED Final   Stenotrophomonas maltophilia NOT DETECTED NOT DETECTED Final   Candida albicans NOT DETECTED NOT DETECTED Final   Candida auris NOT DETECTED NOT DETECTED Final   Candida glabrata NOT DETECTED NOT DETECTED Final   Candida krusei NOT DETECTED NOT DETECTED Final   Candida parapsilosis NOT DETECTED NOT DETECTED Final   Candida tropicalis NOT DETECTED NOT DETECTED Final   Cryptococcus neoformans/gattii NOT  DETECTED NOT DETECTED Final   CTX-M ESBL NOT DETECTED NOT DETECTED Final   Carbapenem resistance IMP NOT DETECTED NOT DETECTED Final   Carbapenem resistance KPC NOT DETECTED NOT DETECTED Final   Carbapenem resistance NDM NOT DETECTED NOT DETECTED Final   Carbapenem resist OXA 48 LIKE NOT DETECTED NOT DETECTED Final   Carbapenem resistance VIM NOT DETECTED NOT DETECTED Final    Comment: Performed at [facility], [address], [place], [place] [address]  Blood Culture (routine x 2)     Status: Abnormal (Preliminary result)   Collection Time: 06/11/23  4:08 PM   Specimen: BLOOD LEFT FOREARM  Result Value Ref Range Status   Specimen Description   Final    BLOOD LEFT FOREARM Performed at [facility], [address], [place], [place] [address]    Special Requests   Final    BOTTLES DRAWN AEROBIC AND ANAEROBIC Blood Culture adequate volume Performed at [facility], [address] [place], [place] [address]    Culture  Setup Time   Final    GRAM NEGATIVE RODS IN BOTH AEROBIC AND ANAEROBIC BOTTLES CRITICAL VALUE NOTED.  VALUE IS CONSISTENT WITH PREVIOUSLY REPORTED AND CALLED VALUE.    Culture (A)  Final    CITROBACTER FREUNDII SUSCEPTIBILITIES PERFORMED ON PREVIOUS CULTURE WITHIN THE LAST 5 DAYS. Performed at [facility], [address] [place], [place] [address]    Report Status PENDING  Incomplete  Urine Culture     Status: Abnormal   Collection Time: 06/11/23  6:14 PM   Specimen: Urine, Random  Result Value  Ref Range Status   Specimen Description   Final    URINE, RANDOM Performed at [facility], [address], [place], [place] [address]    Special Requests   Final    NONE Reflexed from [id number] Performed at [facility], [address], [place], [place] [address]    Culture >=100,000 COLONIES/mL CITROBACTER FREUNDII (A)  Final   Report Status 06/13/2023 FINAL  Final   Organism ID, Bacteria  CITROBACTER FREUNDII (A)  Final      Susceptibility   Citrobacter freundii - MIC*    CEFEPIME <=0.12 SENSITIVE Sensitive     CEFTRIAXONE <=0.25 SENSITIVE Sensitive     CIPROFLOXACIN <=0.25 SENSITIVE Sensitive     GENTAMICIN <=1 SENSITIVE Sensitive     IMIPENEM 1 SENSITIVE Sensitive     NITROFURANTOIN <=16 SENSITIVE Sensitive     TRIMETH/SULFA <=20 SENSITIVE Sensitive     PIP/TAZO <=4 SENSITIVE Sensitive     * >=100,000 COLONIES/mL CITROBACTER FREUNDII    Radiology Studies: EEG adult  Result Date: 06/29/23 Charlsie Quest, MD     June 29, 2023  2:19 PM Patient Name: Aaron Hickman MRN: [id number] Epilepsy Attending: Charlsie Quest Referring Physician/Provider: Merlene Laughter, DO Date: Jun 29, 2023 Duration: 23.38 mins Patient history: 87 year old Caucasian male with past medical history significant for but limited to medial sphenoid meningioma status postradiation, chronic vasogenic edema who has been noncompliant with steroids, seizures, BPH as well as other comorbidities who presented with altered mental status. EEG to evaluate for seizure Level of alertness: Awake, asleep AEDs during EEG study: LEV Technical aspects: This EEG study was done with scalp electrodes positioned according to the 10-20 International system of electrode placement. Electrical activity was reviewed with band pass filter of 1-70Hz , sensitivity of 7 uV/mm, display speed of 47mm/sec with a 60Hz  notched filter applied as appropriate. EEG data were recorded continuously and digitally stored.  Video monitoring was available and reviewed as appropriate. Description: The posterior dominant rhythm consists of 8 Hz activity of moderate voltage (25-35 uV) seen predominantly in posterior head regions, symmetric and reactive to eye opening and eye closing. Sleep was characterized by vertex waves, sleep spindles (12 to 14 Hz), maximal frontocentral region.  EEG showed intermittent 3 to 6 Hz theta-delta slowing in right  temporo-parietal region. Hyperventilation and photic stimulation were not performed.   ABNORMALITY - Intermittent slow, right temporo-parietal region. IMPRESSION: This study is suggestive of cortical dysfunction arising from right temporo-parietal region, likely secondary to underlying structural abnormality. No seizures or epileptiform discharges were seen throughout the recording. Priyanka Annabelle Harman    Scheduled Meds:  Chlorhexidine Gluconate Cloth  6 each Topical Daily   enoxaparin (LOVENOX) injection  40 mg Subcutaneous Q24H   levETIRAcetam  500 mg Oral BID   levothyroxine  50 mcg Oral Q0600   multivitamin with minerals  1 tablet Oral Daily   OLANZapine zydis  2.5 mg Oral Daily   OLANZapine zydis  5 mg Oral QHS   potassium chloride  40 mEq Oral BID   sodium chloride  1 g Oral BID WC   sulfamethoxazole-trimethoprim  1 tablet Oral BID   Continuous Infusions:  magnesium sulfate bolus IVPB      LOS: 4 days   Marguerita Merles, DO [facility] Available via Epic secure chat 7am-7pm After these hours, please refer to coverage provider listed on [email or web address] 06-29-2023, 7:13 PM

## 2023-06-15 NOTE — Progress Notes (Signed)
OT Cancellation Note  Patient Details Name: Aaron Hickman MRN: [id number] DOB: 1936/06/10   Cancelled Treatment:    Reason Eval/Treat Not Completed: Patient at procedure or test/ unavailable. EEG is being set up on patient and patient sleeping soundly (with RN asking [place] not to awaken patient--please try to see him when already awake).  Lindon Romp OT Acute Rehabilitation Services Office [phone]    Evette Georges 06/15/2023, 8:45 AM

## 2023-06-15 NOTE — Progress Notes (Addendum)
Pt was picked up by [facility].  Pt's wife, Khalif Insixiengmay was notified of the transfer.

## 2023-06-15 NOTE — Care Management Important Message (Signed)
Important Message  Patient Details IM Letter given. Name: MONTI TASHJIAN MRN: [id number] Date of Birth: [birth year]   Medicare Important Message Given:  Yes     Caren Macadam 06/15/2023, 11:27 AM

## 2023-06-15 NOTE — Procedures (Signed)
Patient Name: Aaron Hickman  MRN: [id number]  Epilepsy Attending: Charlsie Quest  Referring Physician/Provider: Merlene Laughter, DO  Date: 06/15/2023 Duration: 23.38 mins  Patient history: 87 year old Caucasian male with past medical history significant for but limited to medial sphenoid meningioma status postradiation, chronic vasogenic edema who has been noncompliant with steroids, seizures, BPH as well as other comorbidities who presented with altered mental status. EEG to evaluate for seizure  Level of alertness: Awake, asleep  AEDs during EEG study: LEV  Technical aspects: This EEG study was done with scalp electrodes positioned according to the 10-20 International system of electrode placement. Electrical activity was reviewed with band pass filter of 1-70Hz , sensitivity of 7 uV/mm, display speed of 42mm/sec with a 60Hz  notched filter applied as appropriate. EEG data were recorded continuously and digitally stored.  Video monitoring was available and reviewed as appropriate.  Description: The posterior dominant rhythm consists of 8 Hz activity of moderate voltage (25-35 uV) seen predominantly in posterior head regions, symmetric and reactive to eye opening and eye closing. Sleep was characterized by vertex waves, sleep spindles (12 to 14 Hz), maximal frontocentral region.  EEG showed intermittent 3 to 6 Hz theta-delta slowing in right temporo-parietal region. Hyperventilation and photic stimulation were not performed.     ABNORMALITY - Intermittent slow, right temporo-parietal region.   IMPRESSION: This study is suggestive of cortical dysfunction arising from right temporo-parietal region, likely secondary to underlying structural abnormality. No seizures or epileptiform discharges were seen throughout the recording.   Annabelle Harman

## 2023-06-15 NOTE — Evaluation (Signed)
Occupational Therapy Evaluation Patient Details Name: Aaron Hickman MRN: [id number] DOB: [birth year] Today's Date: 06/15/2023   History of Present Illness Aaron Hickman is a 87 y.o. male presents with altered mental status.Found to have sepsis secondary to UTI and  acute metabolic encephalopathy. PHMx: medial sphenoid meningoma s/p radiation, chronic vasogenic edema (noncompliant with steroids), seizures, BPH,memory decline, confusion, and disorientation over the past few months; very HOH.   Clinical Impression   This 86 yo male seen today in partial conjunction with PT. Pt reports he is normally able to get around without an AD and do his own basic ADLs. Per chart he has been struggling with this for the past few months. Today he was conversant with me, asking about when the doctor was coming and realizing he was not as steady on his feet as he "normally" is without me asking. He was ambulating well with RW (slowly but well) when we met PT in the hallway and she took over from there. Upon returning to room we asked him if he needed to go to the bathroom and he said yes, but then would not move however he did ask can I not just sit down in that chair (pointed to recliner). Tried to clarify with him about bathroom but he was not answering [place] so we A'd him to recliner at which point he "went out" on [place] with nystagmus in both eyes before he closed them. BP cuff put on and called to front desk immediately. 4 floor staff into room initially. BP 56/44 sitting in recliner head back and feet up. A'd with scooting pt from recliner to bed where pt was then placed in reverse trendelenburg and he started talking more. We will continue to follow with follow up OT (HHOT or HHOT at ALF memory care)      If plan is discharge home, recommend the following: A little help with walking and/or transfers;A little help with bathing/dressing/bathroom;Assistance with cooking/housework;Help with stairs or ramp for  entrance;Assist for transportation;Direct supervision/assist for financial management;Direct supervision/assist for medications management    Functional Status Assessment  Patient has had a recent decline in their functional status and demonstrates the ability to make significant improvements in function in a reasonable and predictable amount of time.  Equipment Recommendations  BSC/3in1       Precautions / Restrictions Precautions Precautions: Fall Precaution Comments: can be combative Restrictions Weight Bearing Restrictions: No      Mobility Bed Mobility               General bed mobility comments: pt up in recliner upon arrival    Transfers Overall transfer level: Needs assistance Equipment used: Rolling walker (2 wheels) Transfers: Sit to/from Stand Sit to Stand: Min assist                  Balance Overall balance assessment: Needs assistance Sitting-balance support: No upper extremity supported, Feet supported Sitting balance-Leahy Scale: Fair     Standing balance support: Bilateral upper extremity supported, Reliant on assistive device for balance Standing balance-Leahy Scale: Poor                             ADL either performed or assessed with clinical judgement   ADL Overall ADL's : Needs assistance/impaired Eating/Feeding: Independent;Sitting Eating/Feeding Details (indicate cue type and reason): in recliner Grooming: Supervision/safety;Set up;Sitting Grooming Details (indicate cue type and reason): in recliner Upper Body Bathing: Set up;Supervision/ safety;Sitting  Upper Body Bathing Details (indicate cue type and reason): in recliner Lower Body Bathing: Minimal assistance;Sit to/from stand   Upper Body Dressing : Set up;Supervision/safety;Sitting Upper Body Dressing Details (indicate cue type and reason): in recliner Lower Body Dressing: Minimal assistance;Sit to/from stand   Toilet Transfer: Minimal  assistance;Ambulation;Rolling walker (2 wheels) Toilet Transfer Details (indicate cue type and reason): simulated bed>out door ~10 feet> sat in recliner after walking further with PT Toileting- Clothing Manipulation and Hygiene: Minimal assistance;Sit to/from stand               Vision Patient Visual Report: No change from baseline              Pertinent Vitals/Pain Pain Assessment Pain Assessment: No/denies pain     Extremity/Trunk Assessment Upper Extremity Assessment Upper Extremity Assessment: Defer to OT evaluation   Lower Extremity Assessment Lower Extremity Assessment: Generalized weakness       Communication Communication Communication: Hearing impairment (deaf in right ear, very HOH in left ear) Cueing Techniques: Verbal cues;Gestural cues   Cognition Arousal: Alert Behavior During Therapy: WFL for tasks assessed/performed Overall Cognitive Status: Within Functional Limits for tasks assessed                                 General Comments: Upon first standing up from recliner pt was unsteady so I asked him if he used a walker at home, "No, but maybe I should since my legs feel weak"                Home Living Family/patient expects to be discharged to:: Private residence Living Arrangements: Spouse/significant other Available Help at Discharge: Family Type of Home: House Home Access: Stairs to enter [employer] of Steps: a few   Home Layout: Able to live on main level with bedroom/bathroom     Bathroom Shower/Tub: Producer, television/film/video: Standard     Home Equipment: None   Additional Comments: pt is a poor historian and information above obtained from prior hospitalization 03/2023      Prior Functioning/Environment Prior Level of Function : Patient poor historian/Family not available (Simultaneous filing. User may not have seen previous data.)             Mobility Comments: No AD for mobility  (Simultaneous filing. User may not have seen previous data.) ADLs Comments: pt reports doing all ADLs, driving (Simultaneous filing. User may not have seen previous data.)        OT Problem List: Decreased activity tolerance;Impaired balance (sitting and/or standing);Decreased cognition         OT Goals(Current goals can be found in the care plan section) Acute Rehab OT Goals Patient Stated Goal: earlier before going out he was wanting to see the doctor OT Goal Formulation: Patient unable to participate in goal setting (due to "going out" at the end of therapy) Time For Goal Achievement: 06/29/23 Potential to Achieve Goals: Good      Co-evaluation   Reason for Co-Treatment: Necessary to address cognition/behavior during functional activity;For patient/therapist safety;To address functional/ADL transfers PT goals addressed during session: Mobility/safety with mobility;Balance;Proper use of DME OT goals addressed during session: ADL's and self-care;Strengthening/ROM      AM-PAC OT "6 Clicks" Daily Activity     Outcome Measure Help from another person eating meals?: None Help from another person taking care of personal grooming?: A Little Help from another person toileting, which includes  using toliet, bedpan, or urinal?: A Little Help from another person bathing (including washing, rinsing, drying)?: A Little Help from another person to put on and taking off regular upper body clothing?: A Little Help from another person to put on and taking off regular lower body clothing?: A Little 6 Click Score: 19   End of Session Equipment Utilized During Treatment: Gait belt;Rolling walker (2 wheels) Nurse Communication:  (how he was doing with therapy before he went out)  Activity Tolerance: Treatment limited secondary to medical complications (Comment) (passed out/seizure?) Patient left: in bed (with several nursing staff and MD in room checking on/working with patient)  OT Visit  Diagnosis: Unsteadiness on feet (R26.81);Other abnormalities of gait and mobility (R26.89);Muscle weakness (generalized) (M62.81)                Time: 4098-1191 OT Time Calculation (min): 20 min Charges:  OT General Charges $OT Visit: 1 Visit OT Evaluation $OT Eval Low Complexity: 1 Low  Lindon Romp OT Acute Rehabilitation Services Office [phone]    Evette Georges 06/15/2023, 11:49 AM

## 2023-06-15 NOTE — Evaluation (Signed)
Physical Therapy Evaluation Patient Details Name: Aaron Hickman MRN: [id number] DOB: April 23, 1936 Today's Date: 06/15/2023  History of Present Illness  Aaron Hickman is a 87 y.o. male presents with altered mental status.Found to have sepsis secondary to UTI and  acute metabolic encephalopathy. PHMx: medial sphenoid meningoma s/p radiation, chronic vasogenic edema (noncompliant with steroids), seizures, BPH,memory decline, confusion, and disorientation over the past few months; very HOH.  Clinical Impression    Pt admitted with above diagnosis.  Pt currently with functional limitations due to the deficits listed below (see PT Problem List). Pt had EEG earlier in am and sleeping. Results of EEG pending. PT arrived later in am and pt in hallway with RW with OT. OT indicated pt was seated in recliner upon arrival and agreeable to therapy. OT indicated pt reported B LE instability and it is noted no AD PLOF. Pt able to participate with gait tasks with RW, CGA and cues for posture and proper distance from RW for 150 feet. Once returned to personal room pt indicated that he needed to sit, during the course of the transfer with RW to recliner, pt required min A due to instability with use for proper UE and AD placement, when seated in chair therapist noted unresponsive event, pt in recliner and required total A x 3 to transition to bed for trendelenburg, nursing staff present and MD arrived to assess. Pt left in bed with hospital staff.  Pt will benefit from acute skilled PT to increase their independence and safety with mobility to allow discharge.   O2 saturation upon immediate sitting in recliner 94% on RA Bp s/p LOC/unresponsive event 57/39 (74 PR) Bp once in bed in trendelenburg 96/60 manually  150 mg/dL blood glucose        If plan is discharge home, recommend the following: A little help with walking and/or transfers;A little help with bathing/dressing/bathroom;Assistance with  cooking/housework;Direct supervision/assist for medications management;Direct supervision/assist for financial management;Assist for transportation;Help with stairs or ramp for entrance;Supervision due to cognitive status   Can travel by private vehicle        Equipment Recommendations Rolling walker (2 wheels)  Recommendations for Other Services       Functional Status Assessment Patient has had a recent decline in their functional status and demonstrates the ability to make significant improvements in function in a reasonable and predictable amount of time.     Precautions / Restrictions Precautions Precautions: Fall Precaution Comments: can be combative Restrictions Weight Bearing Restrictions: No      Mobility  Bed Mobility               General bed mobility comments: pt up in recliner upon arrival    Transfers Overall transfer level: Needs assistance Equipment used: Rolling walker (2 wheels) Transfers: Sit to/from Stand Sit to Stand: Contact guard assist           General transfer comment: cues for safety and proper RW management, pt exhibited one episode of instability with approach to recliner s/p gait tasks    Ambulation/Gait Ambulation/Gait assistance: Contact guard assist Gait Distance (Feet): 150 Feet Assistive device: Rolling walker (2 wheels) Gait Pattern/deviations: Step-to pattern, Shuffle, Trunk flexed Gait velocity: decreased     General Gait Details: cues for posture, proper distance from RW and safety with turns  Stairs            Wheelchair Mobility     Tilt Bed    Modified Rankin (Stroke Patients Only)  Balance Overall balance assessment: Needs assistance Sitting-balance support: No upper extremity supported, Feet supported Sitting balance-Leahy Scale: Fair     Standing balance support: Bilateral upper extremity supported Standing balance-Leahy Scale: Poor                                Pertinent Vitals/Pain Pain Assessment Pain Assessment: No/denies pain Breathing: normal Negative Vocalization: none Facial Expression: sad, frightened, frown Body Language: relaxed Consolability: no need to console PAINAD Score: 1    Home Living Family/patient expects to be discharged to:: Private residence Living Arrangements: Spouse/significant other Available Help at Discharge: Family Type of Home: House Home Access: Stairs to enter   [employer] of Steps: a few   Home Layout: Able to live on main level with bedroom/bathroom Home Equipment: None Additional Comments: pt is a poor historian and information above obtained from prior hospitalization 03/2023    Prior Function Prior Level of Function : Patient poor historian/Family not available (Simultaneous filing. User may not have seen previous data.)             Mobility Comments: No AD for mobility (Simultaneous filing. User may not have seen previous data.) ADLs Comments: pt reports doing all ADLs, driving (Simultaneous filing. User may not have seen previous data.)     Extremity/Trunk Assessment   Upper Extremity Assessment Upper Extremity Assessment: Defer to OT evaluation    Lower Extremity Assessment Lower Extremity Assessment: Generalized weakness       Communication   Communication Communication: Hearing impairment (deaf in right ear, very HOH in left ear) Cueing Techniques: Verbal cues;Gestural cues  Cognition Arousal: Alert Behavior During Therapy: WFL for tasks assessed/performed Overall Cognitive Status: History of cognitive impairments - at baseline                                          General Comments      Exercises     Assessment/Plan    PT Assessment Patient needs continued PT services  PT Problem List Decreased strength;Decreased activity tolerance;Decreased balance;Decreased mobility;Decreased coordination;Decreased knowledge of use of  DME;Decreased safety awareness;Decreased knowledge of precautions;Cardiopulmonary status limiting activity       PT Treatment Interventions DME instruction;Gait training;Functional mobility training;Therapeutic activities;Therapeutic exercise;Balance training;Neuromuscular re-education;Stair training;Cognitive remediation;Patient/family education    PT Goals (Current goals can be found in the Care Plan section)  Acute Rehab PT Goals PT Goal Formulation: Patient unable to participate in goal setting Time For Goal Achievement: 06/29/23 Potential to Achieve Goals: Fair    Frequency Min 1X/week     Co-evaluation PT/OT/SLP Co-Evaluation/Treatment: Yes Reason for Co-Treatment: Necessary to address cognition/behavior during functional activity;For patient/therapist safety;To address functional/ADL transfers PT goals addressed during session: Mobility/safety with mobility;Balance;Proper use of DME OT goals addressed during session: ADL's and self-care;Strengthening/ROM       AM-PAC PT "6 Clicks" Mobility  Outcome Measure Help needed turning from your back to your side while in a flat bed without using bedrails?: A Little Help needed moving from lying on your back to sitting on the side of a flat bed without using bedrails?: A Little Help needed moving to and from a bed to a chair (including a wheelchair)?: A Little Help needed standing up from a chair using your arms (e.g., wheelchair or bedside chair)?: A Little Help needed to walk in hospital room?:  A Little Help needed climbing 3-5 steps with a railing? : A Lot 6 Click Score: 17    End of Session Equipment Utilized During Treatment: Gait belt Activity Tolerance: Treatment limited secondary to medical complications (Comment) (LOC) Patient left: in bed;with nursing/sitter in room Nurse Communication: Mobility status;Other (comment) (LOC) PT Visit Diagnosis: Unsteadiness on feet (R26.81);Other abnormalities of gait and mobility  (R26.89);Muscle weakness (generalized) (M62.81);Difficulty in walking, not elsewhere classified (R26.2)    Time: 1610-9604 PT Time Calculation (min) (ACUTE ONLY): 16 min   Charges:   PT Evaluation $PT Eval Moderate Complexity: 1 Mod PT Treatments $Gait Training: 8-22 mins PT General Charges $$ ACUTE PT VISIT: 1 Visit         Johnny Bridge, PT Acute Rehab   Jacqualyn Posey 06/15/2023, 11:54 AM

## 2023-06-16 DIAGNOSIS — N390 Urinary tract infection, site not specified: Secondary | ICD-10-CM | POA: Diagnosis not present

## 2023-06-16 DIAGNOSIS — R4182 Altered mental status, unspecified: Secondary | ICD-10-CM | POA: Diagnosis not present

## 2023-06-16 DIAGNOSIS — A419 Sepsis, unspecified organism: Secondary | ICD-10-CM | POA: Diagnosis not present

## 2023-06-16 DIAGNOSIS — E872 Acidosis, unspecified: Secondary | ICD-10-CM | POA: Diagnosis not present

## 2023-06-16 DIAGNOSIS — N1330 Unspecified hydronephrosis: Secondary | ICD-10-CM | POA: Diagnosis not present

## 2023-06-16 DIAGNOSIS — R569 Unspecified convulsions: Secondary | ICD-10-CM | POA: Diagnosis not present

## 2023-06-16 DIAGNOSIS — D329 Benign neoplasm of meninges, unspecified: Secondary | ICD-10-CM | POA: Diagnosis not present

## 2023-06-16 LAB — COMPREHENSIVE METABOLIC PANEL
ALT: 24 U/L (ref 0–44)
AST: 32 U/L (ref 15–41)
Albumin: 2.5 g/dL — ABNORMAL LOW (ref 3.5–5.0)
Alkaline Phosphatase: 47 U/L (ref 38–126)
Anion gap: 10 (ref 5–15)
BUN: 13 mg/dL (ref 8–23)
CO2: 26 mmol/L (ref 22–32)
Calcium: 8.1 mg/dL — ABNORMAL LOW (ref 8.9–10.3)
Chloride: 103 mmol/L (ref 98–111)
Creatinine, Ser: 0.96 mg/dL (ref 0.61–1.24)
GFR, Estimated: >60 mL/min (ref >60)
Glucose, Bld: 104 mg/dL — ABNORMAL HIGH (ref 70–99)
Potassium: 3.8 mmol/L (ref 3.5–5.1)
Sodium: 139 mmol/L (ref 135–145)
Total Bilirubin: 0.2 mg/dL — ABNORMAL LOW (ref 0.3–1.2)
Total Protein: 5.2 g/dL — ABNORMAL LOW (ref 6.5–8.1)

## 2023-06-16 LAB — PHOSPHORUS: Phosphorus: 2.3 mg/dL — ABNORMAL LOW (ref 2.5–4.6)

## 2023-06-16 LAB — MAGNESIUM: Magnesium: 2.1 mg/dL (ref 1.7–2.4)

## 2023-06-16 LAB — GLUCOSE, CAPILLARY: Glucose-Capillary: 102 mg/dL — ABNORMAL HIGH (ref 70–99)

## 2023-06-16 LAB — AMMONIA: Ammonia: 37 umol/L — ABNORMAL HIGH (ref 9–35)

## 2023-06-16 LAB — TSH: TSH: 0.302 u[IU]/mL — ABNORMAL LOW (ref 0.350–4.500)

## 2023-06-16 LAB — T4, FREE: Free T4: 1.22 ng/dL — ABNORMAL HIGH (ref 0.61–1.12)

## 2023-06-16 LAB — CK: Total CK: 19 U/L — ABNORMAL LOW (ref 49–397)

## 2023-06-16 MED ORDER — DIVALPROEX SODIUM ER 500 MG PO TB24
750.0000 mg | ORAL_TABLET | Freq: Every day | ORAL | Status: DC
  Filled 2023-06-16: qty 1

## 2023-06-16 MED ORDER — VALPROATE SODIUM 100 MG/ML IV SOLN
1000.0000 mg | Freq: Once | INTRAVENOUS | Status: AC
  Administered 2023-06-16: 1000 mg via INTRAVENOUS
  Filled 2023-06-16: qty 10

## 2023-06-16 MED ORDER — DIVALPROEX SODIUM 250 MG PO DR TAB: 500.0000 mg | DELAYED_RELEASE_TABLET | Freq: Two times a day (BID) | ORAL | Status: DC

## 2023-06-16 MED ORDER — MELATONIN 3 MG PO TABS
3.0000 mg | ORAL_TABLET | Freq: Every day | ORAL | Status: DC
  Administered 2023-06-16 – 2023-06-20 (×4): 3 mg via ORAL
  Filled 2023-06-16 (×5): qty 1

## 2023-06-16 MED ORDER — VALPROATE SODIUM 100 MG/ML IV SOLN
250.0000 mg | Freq: Three times a day (TID) | INTRAVENOUS | Status: DC
  Filled 2023-06-16: qty 2.5

## 2023-06-16 MED ORDER — LACOSAMIDE 50 MG PO TABS
75.0000 mg | ORAL_TABLET | Freq: Two times a day (BID) | ORAL | Status: DC
  Administered 2023-06-16 – 2023-06-17 (×3): 75 mg via ORAL
  Filled 2023-06-16 (×3): qty 2

## 2023-06-16 NOTE — Progress Notes (Signed)
LTM EEG hooked up and running - no initial skin breakdown - push button tested - [facility] monitoring.  

## 2023-06-16 NOTE — Procedures (Signed)
Patient Name: Aaron Hickman  MRN: [id number]  Epilepsy Attending: Charlsie Quest  Referring Physician/Provider: Erick Blinks, MD  Duration: 06/16/2023 0005 to 06/17/2023 0005  Patient history: 87 year old Caucasian male with past medical history significant for but limited to medial sphenoid meningioma status postradiation, chronic vasogenic edema who has been noncompliant with steroids, seizures, BPH as well as other comorbidities who presented with altered mental status. EEG to evaluate for seizure   Level of alertness: Awake, asleep  AEDs during EEG study: LEV, VPA  Technical aspects: This EEG study was done with scalp electrodes positioned according to the 10-20 International system of electrode placement. Electrical activity was reviewed with band pass filter of 1-70Hz , sensitivity of 7 uV/mm, display speed of 31mm/sec with a 60Hz  notched filter applied as appropriate. EEG data were recorded continuously and digitally stored.  Video monitoring was available and reviewed as appropriate.  Description: The posterior dominant rhythm consists of 8 Hz activity of moderate voltage (25-35 uV) seen predominantly in posterior head regions, symmetric and reactive to eye opening and eye closing. Sleep was characterized by vertex waves, sleep spindles (12 to 14 Hz), maximal frontocentral region. EEG showed intermittent 3 to 6 Hz theta-delta slowing in right temporo-parietal region. Spikes were noted in right frontal- anterior temporal region.  Seizure without clinical signs was noted arising from right frontal- anterior temporal region. EEG showed rhythmic sharply contoured 5Hz  theta slowing in right frontal- anterior temporal region which then involved all of right hemisphere as well as vertex region. EEG then evolved into sharply contoured high amplitude 3-5Hz  theta-delta slowing. EEG then involved left frontal region. Onset of seizure on 06/16/2023 at 0151, lasting 1 minute 24  seconds.  Hyperventilation and photic stimulation were not performed.     ABNORMALITY - Seizure without clinical signs, right frontal- anterior temporal region. - Spike, right frontal- anterior temporal region - Intermittent slow, right temporo-parietal region.   IMPRESSION: This study showed one seizure without clinical signs arising from right frontal-anterior temporal region on 06/16/2023 at 0151 lasting about 1 minute 24 seconds.  Additionally there was evidence of epileptogenicity arising from right frontal-anterior temporal region.  Lastly the study was suggestive of cortical dysfunction arising from right temporo-parietal region, likely secondary to underlying structural abnormality.    Annabelle Harman

## 2023-06-16 NOTE — Progress Notes (Signed)
LTM maint complete - skin irritation/breakdown under F8 and pt pulled a patch of hair from behind rt ear no redness seen in the area as of yet. Serviced F8 and A2 reapplied head wrap. [facility] monitored, Event button test confirmed by [facility].

## 2023-06-16 NOTE — Progress Notes (Signed)
PROGRESS NOTE  SINGLETON POLEK ZOX:[id number] DOB: [birth year]   PCP: [facility]  Patient is from: Home.  Lives with his wife.  DOA: 06/11/2023 LOS: 5  Chief complaints Chief Complaint  Patient presents with   Altered Mental Status     Brief Narrative / Interim history: 87 year old M with PMH of seizure disorder, meningioma, chronic vasogenic edema, BPH and noncompliance with medication presenting with altered mental status and decreased p.o. intake, and admitted for sepsis due to urinary tract infection, acute metabolic encephalopathy with agitation and AKI.  He was febrile 201 and tachycardic.  Had leukocytosis to 28.3.  UA concerning for UTI.  Cultures obtained.  Started on IV ceftriaxone and IV fluid.  Later, urine and blood culture with pansensitive Citrobacter fruendii.  Initially, antibiotic escalated to cefepime and then de-escalated to Bactrim.  Patient has some behavioral issues and agitation for which she was started on Haldol and Zyprexa.  Psychiatry consulted and adjusted his Zyprexa and recommended neurology consultation due to concern about his Keppra contributing to behavioral disturbance.  Neurology recommended transfer to [facility] and changed his antiepileptics and started continuous EEG.    Subjective: Seen and examined earlier this morning.  No major events overnight of this morning.  Patient is awake and alert but off topic. He is response to my questions does not make sense.  Follows some commands but not consistently.  No focal neurodeficit but limited exam.  Objective: Vitals:   06/15/23 2126 06/16/23 0134 06/16/23 0359 06/16/23 0700  BP: (!) 142/75 (!) 151/88  (!) 153/89  Pulse: 72 68 (!) 59 67  Resp: 18   20  Temp: 98.4 F (36.9 C) 98.1 F (36.7 C) 98 F (36.7 C) 97.6 F (36.4 C)  TempSrc: Oral Oral Axillary Axillary  SpO2: 98% 99% 99% 95%  Weight:      Height:        Examination:  GENERAL: No apparent distress.   Nontoxic. HEENT: MMM.  Vision and hearing grossly intact.  NECK: Supple.  No apparent JVD.  RESP:  No IWOB.  Fair aeration bilaterally. CVS:  RRR. Heart sounds normal.  ABD/GI/GU: BS+. Abd soft, NTND.  MSK/EXT:  Moves extremities. No apparent deformity. No edema.  SKIN: no apparent skin lesion or wound NEURO: awake and alert but off topic. He is response to my questions does not make sense.  Follows some commands but not consistently.  No focal neurodeficit but limited exam. PSYCH: Calm.  No agitation.  Procedures:  None  Microbiology summarized: COVID-19, influenza and RSV PCR nonreactive Blood and urine culture with pansensitive Citrobacter freundii  Assessment and plan: Principal Problem:   Sepsis secondary to UTI [facility]) Active Problems:   Meningioma (HCC)   Seizure (HCC)   Metabolic acidosis, normal anion gap (NAG)   Acute renal failure (ARF) (HCC)   Bilateral hydronephrosis   Acute metabolic encephalopathy  Severe sepsis due to Citrobacter freundii UTI and bacteremia: POA.  Has fever, leukocytosis, tachycardia with AKI and mental status change on presentation.  Culture data as above.   -Ceftriaxone >> cefepime >> Bactrim for a total of 7 days. -Send Foley was changed on admission.  Acute metabolic encephalopathy/agitation: Multifactorial including sepsis, delirium and possibly iatrogenic from Keppra.  Patient is confused, and speech doesn't make sense.  He is not agitated or combative.  He intermittently follows commands. -Treat reversible causes. -Appreciate input by psychiatry and neurology -Neurology changed AED to Vimpat and started continuous EEG -Psychiatry increase Zyprexa  Bilateral Hydronephrosis: Likely due to to enlarged prostate.  CT abdomen and pelvis with mild bilateral hydronephrosis.  Foley catheter placed in ED. Unclear if he came in with chronic Foley -Will clarify Foley status and attempt voiding trial a Foley sooner.   AKI/metabolic acidosis:  Resolved. Recent Labs    04/01/23 0111 04/02/23 0618 05/17/23 1300 05/21/23 1430 06/11/23 1605 06/12/23 0552 06/13/23 0526 06/14/23 0543 06/15/23 1115 06/16/23 1032  BUN 14 24* 15 28* 66* 57* 36* 23 18 13   CREATININE 1.03 1.02 1.10 1.17 3.92* 2.06* 1.06 0.81 0.98 0.96  -Continue monitoring -Avoid nephrotoxic meds   Normocytic anemia/thrombocytopenia: Stable.  Anemia panel unrevealing. Recent Labs    03/30/23 1504 03/30/23 1505 03/31/23 0202 05/17/23 1300 05/21/23 1430 06/11/23 1605 06/12/23 0552 06/13/23 0526 06/14/23 0543 06/15/23 1115  HGB 12.6* 12.6* 12.4* 11.7* 11.0* 9.4* 9.1* 8.9* 9.9* 10.5*  -Continue monitoring    Hypokalemia/hypomagnesemia -Monitor replenish as appropriate   Seizure disorder: No clinical seizure noted.  EEG concerning for subclinical seizure. -Neurology following. -Keppra changed to Vimpat due to concern about behavioral disturbance -Continuous EEG   Syncopal episode: Reportedly ambulated and passed out while in the hospital.  BP dropped to 56/44. -Check orthostatic vitals -Fall precaution  Hypothyroidism: TSH low. 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 Known lesion abutting the right cavernous sinus is poorly visualized due to CT technique.  Radiology recommended considering MRI for definitive characterization.  Previous attending discussed with neuro-oncology, Dr. Asher Muir who felt CT finding to be chronic and recommended no further imaging. -Outpatient follow-up.   Physical conditioning -PT/OT    Body mass index is 22.11 kg/m.   DVT prophylaxis:  Place TED hose Start: 06/15/23 1119 enoxaparin (LOVENOX) injection 40 mg Start: 06/14/23 1000  Code Status: Full code Family Communication: None at bedside Level of care:  Telemetry Medical Status is: Inpatient Remains inpatient appropriate because: Acute encephalopathy, bacteremia,   Final disposition: TBD Consultants:  Psychiatry Neurology Neuro-oncology  55 minutes with more than 50% spent in reviewing records, counseling patient/family and coordinating care.   Sch Meds:  Scheduled Meds:  Chlorhexidine Gluconate Cloth  6 each Topical Daily   enoxaparin (LOVENOX) injection  40 mg Subcutaneous Q24H   lacosamide  75 mg Oral BID   levothyroxine  50 mcg Oral Q0600   melatonin  3 mg Oral QHS   multivitamin with minerals  1 tablet Oral Daily   OLANZapine zydis  2.5 mg Oral Daily   OLANZapine zydis  5 mg Oral QHS   sodium chloride  1 g Oral BID WC   sulfamethoxazole-trimethoprim  1 tablet Oral BID   Continuous Infusions: PRN Meds:.haloperidol lactate  Antimicrobials: Anti-infectives (From admission, onward)    Start     Dose/Rate Route Frequency Ordered Stop   06/14/23 2200  sulfamethoxazole-trimethoprim (BACTRIM DS) 800-160 MG per tablet 1 tablet        1 tablet Oral 2 times daily 06/14/23 1750     06/14/23 1400  ceFEPIme (MAXIPIME) 2 g in sodium chloride 0.9 % 100 mL IVPB  Status:  Discontinued        2 g 200 mL/hr over 30 Minutes Intravenous Every 8 hours 06/14/23 1228 06/14/23 1749   06/12/23 1600  cefTRIAXone (ROCEPHIN) 2 g in sodium chloride 0.9 % 100 mL IVPB  Status:  Discontinued  2 g 200 mL/hr over 30 Minutes Intravenous Every 24 hours 06/11/23 2104 06/14/23 1228   06/11/23 1615  cefTRIAXone (ROCEPHIN) 1 g in sodium chloride 0.9 % 100 mL IVPB  Status:  Discontinued        1 g 200 mL/hr over 30 Minutes Intravenous Every 24 hours 06/11/23 1603 06/11/23 1608   06/11/23 1615  cefTRIAXone (ROCEPHIN) 2 g in sodium chloride 0.9 % 100 mL IVPB        2 g 200 mL/hr over 30 Minutes Intravenous  Once 06/11/23 1608 06/11/23 1741        I have personally reviewed the following labs and images: CBC: Recent Labs  Lab 06/11/23 1605  06/12/23 0552 06/13/23 0526 06/14/23 0543 06/15/23 1115  WBC 28.3* 25.3* 13.1* 7.6 9.1  NEUTROABS 23.4*  --  11.2* 5.5 6.3  HGB 9.4* 9.1* 8.9* 9.9* 10.5*  HCT 29.3* 28.5* 29.7* 31.2* 34.1*  MCV 97.0 98.3 102.1* 97.2 98.3  PLT 139* 117* 118* 128* 160   BMP &GFR Recent Labs  Lab 06/12/23 0552 06/13/23 0526 06/14/23 0543 06/15/23 1115 06/16/23 1032  NA 144 144 141 138 139  K 3.9 3.6 3.1* 3.5 3.8  CL 116* 113* 108 107 103  CO2 19* 21* 23 21* 26  GLUCOSE 104* 73 77 150* 104*  BUN 57* 36* 23 18 13   CREATININE 2.06* 1.06 0.81 0.98 0.96  CALCIUM 8.1* 8.5* 8.3* 7.8* 8.1*  MG  --  1.7 1.7 1.4* 2.1  PHOS  --  2.5 2.9 2.9 2.3*   Estimated Creatinine Clearance: 56.6 mL/min (by C-G formula based on SCr of 0.96 mg/dL). Liver & Pancreas: Recent Labs  Lab 06/11/23 1605 06/13/23 0526 06/14/23 0543 06/15/23 1115 06/16/23 1032  AST 30 24 29 22  32  ALT 17 15 20 17 24   ALKPHOS 64 54 52 52 47  BILITOT 0.8 0.5 0.6 1.0 0.2*  PROT 5.3* 4.9* 4.9* 5.4* 5.2*  ALBUMIN 2.6* 2.2* 2.4* 2.7* 2.5*   No results for input(s): "LIPASE", "AMYLASE" in the last 168 hours. Recent Labs  Lab 06/16/23 0241  AMMONIA 37*   Diabetic: No results for input(s): "HGBA1C" in the last 72 hours. Recent Labs  Lab 06/11/23 1600 06/14/23 0732 06/15/23 1107  GLUCAP 113* 63* 150*   Cardiac Enzymes: Recent Labs  Lab 06/16/23 1032  CKTOTAL 19*   No results for input(s): "PROBNP" in the last 8760 hours. Coagulation Profile: Recent Labs  Lab 06/11/23 1605  INR 1.7*   Thyroid Function Tests: Recent Labs    06/16/23 0241 06/16/23 1032  TSH 0.302*  --   FREET4  --  1.22*   Lipid Profile: No results for input(s): "CHOL", "HDL", "LDLCALC", "TRIG", "CHOLHDL", "LDLDIRECT" in the last 72 hours. Anemia Panel: No results for input(s): "VITAMINB12", "FOLATE", "FERRITIN", "TIBC", "IRON", "RETICCTPCT" in the last 72 hours. Urine analysis:    Component Value Date/Time   COLORURINE YELLOW 06/11/2023 1814    APPEARANCEUR CLEAR 06/11/2023 1814   LABSPEC 1.010 06/11/2023 1814   PHURINE 5.0 06/11/2023 1814   GLUCOSEU NEGATIVE 06/11/2023 1814   HGBUR LARGE (A) 06/11/2023 1814   BILIRUBINUR NEGATIVE 06/11/2023 1814   KETONESUR NEGATIVE 06/11/2023 1814   PROTEINUR NEGATIVE 06/11/2023 1814   NITRITE POSITIVE (A) 06/11/2023 1814   LEUKOCYTESUR MODERATE (A) 06/11/2023 1814   Sepsis Labs: Invalid input(s): "PROCALCITONIN", "LACTICIDVEN"  Microbiology: Recent Results (from the past 240 hour(s))  Resp panel by RT-PCR (RSV, Flu A&B, Covid) Anterior Nasal Swab     Status: None  Collection Time: 06/11/23  4:05 PM   Specimen: Anterior Nasal Swab  Result Value Ref Range Status   SARS Coronavirus 2 by RT PCR NEGATIVE NEGATIVE Final    Comment: (NOTE) SARS-CoV-2 target nucleic acids are NOT DETECTED.  The SARS-CoV-2 RNA is generally detectable in upper respiratory specimens during the acute phase of infection. The lowest concentration of SARS-CoV-2 viral copies this assay can detect is 138 copies/mL. A negative result does not preclude SARS-Cov-2 infection and should not be used as the sole basis for treatment or other patient management decisions. A negative result may occur with  improper specimen collection/handling, submission of specimen other than nasopharyngeal swab, presence of viral mutation(s) within the areas targeted by this assay, and inadequate number of viral copies(<138 copies/mL). A negative result must be combined with clinical observations, patient history, and epidemiological information. The expected result is Negative.  Fact Sheet for Patients:  BloggerCourse.com  Fact Sheet for Healthcare Providers:  SeriousBroker.it  This test is no t yet approved or cleared by the [place] FDA and  has been authorized for detection and/or diagnosis of SARS-CoV-2 by FDA under an Emergency Use Authorization (EUA). This EUA will  remain  in effect (meaning this test can be used) for the duration of the COVID-19 declaration under Section 564(b)(1) of the Act, 21 U.S.C.section 360bbb-3(b)(1), unless the authorization is terminated  or revoked sooner.       Influenza A by PCR NEGATIVE NEGATIVE Final   Influenza B by PCR NEGATIVE NEGATIVE Final    Comment: (NOTE) The Xpert Xpress SARS-CoV-2/FLU/RSV plus assay is intended as an aid in the diagnosis of influenza from Nasopharyngeal swab specimens and should not be used as a sole basis for treatment. Nasal washings and aspirates are unacceptable for Xpert Xpress SARS-CoV-2/FLU/RSV testing.  Fact Sheet for Patients: BloggerCourse.com  Fact Sheet for Healthcare Providers: SeriousBroker.it  This test is not yet approved or cleared by the [place] FDA and has been authorized for detection and/or diagnosis of SARS-CoV-2 by FDA under an Emergency Use Authorization (EUA). This EUA will remain in effect (meaning this test can be used) for the duration of the COVID-19 declaration under Section 564(b)(1) of the Act, 21 U.S.C. section 360bbb-3(b)(1), unless the authorization is terminated or revoked.     Resp Syncytial Virus by PCR NEGATIVE NEGATIVE Final    Comment: (NOTE) Fact Sheet for Patients: BloggerCourse.com  Fact Sheet for Healthcare Providers: SeriousBroker.it  This test is not yet approved or cleared by the [place] FDA and has been authorized for detection and/or diagnosis of SARS-CoV-2 by FDA under an Emergency Use Authorization (EUA). This EUA will remain in effect (meaning this test can be used) for the duration of the COVID-19 declaration under Section 564(b)(1) of the Act, 21 U.S.C. section 360bbb-3(b)(1), unless the authorization is terminated or revoked.  Performed at [facility], [address], [place], [place]  [address]   Blood Culture (routine x 2)     Status: Abnormal   Collection Time: 06/11/23  4:05 PM   Specimen: BLOOD  Result Value Ref Range Status   Specimen Description   Final    BLOOD SITE NOT SPECIFIED Performed at [facility], [address], [place], [place] [address]    Special Requests   Final    BOTTLES DRAWN AEROBIC AND ANAEROBIC Blood Culture adequate volume Performed at [facility], [address] [place], [place] [address]    Culture  Setup Time  Final    GRAM NEGATIVE RODS IN BOTH AEROBIC AND ANAEROBIC BOTTLES CRITICAL RESULT CALLED TO, READ BACK BY AND VERIFIED WITH: PHARMD ANH PHAM [phone] [id number] BY Berline Chough, [place] Performed at [facility], [address], [place], [place] [address]    Culture CITROBACTER FREUNDII (A)  Final   Report Status 06/14/2023 FINAL  Final   Organism ID, Bacteria CITROBACTER FREUNDII  Final      Susceptibility   Citrobacter freundii - MIC*    CEFEPIME <=0.12 SENSITIVE Sensitive     CEFTAZIDIME <=1 SENSITIVE Sensitive     CEFTRIAXONE <=0.25 SENSITIVE Sensitive     CIPROFLOXACIN <=0.25 SENSITIVE Sensitive     GENTAMICIN <=1 SENSITIVE Sensitive     IMIPENEM 0.5 SENSITIVE Sensitive     TRIMETH/SULFA <=20 SENSITIVE Sensitive     PIP/TAZO <=4 SENSITIVE Sensitive     * CITROBACTER FREUNDII  Blood Culture ID Panel (Reflexed)     Status: Abnormal   Collection Time: 06/11/23  4:05 PM  Result Value Ref Range Status   Enterococcus faecalis NOT DETECTED NOT DETECTED Final   Enterococcus Faecium NOT DETECTED NOT DETECTED Final   Listeria monocytogenes NOT DETECTED NOT DETECTED Final   Staphylococcus species NOT DETECTED NOT DETECTED Final   Staphylococcus aureus (BCID) NOT DETECTED NOT DETECTED Final   Staphylococcus epidermidis NOT DETECTED NOT DETECTED Final   Staphylococcus lugdunensis NOT DETECTED NOT DETECTED Final   Streptococcus species NOT DETECTED NOT DETECTED Final   Streptococcus agalactiae NOT  DETECTED NOT DETECTED Final   Streptococcus pneumoniae NOT DETECTED NOT DETECTED Final   Streptococcus pyogenes NOT DETECTED NOT DETECTED Final   A.calcoaceticus-baumannii NOT DETECTED NOT DETECTED Final   Bacteroides fragilis NOT DETECTED NOT DETECTED Final   Enterobacterales DETECTED (A) NOT DETECTED Final    Comment: Enterobacterales represent a large order of gram negative bacteria, not a single organism. Refer to culture for further identification. CRITICAL RESULT CALLED TO, READ BACK BY AND VERIFIED WITH: PHARMD ANH PHAM [phone] [phone] BY J [id number], MT    Enterobacter cloacae complex NOT DETECTED NOT DETECTED Final   Escherichia coli NOT DETECTED NOT DETECTED Final   Klebsiella aerogenes NOT DETECTED NOT DETECTED Final   Klebsiella oxytoca NOT DETECTED NOT DETECTED Final   Klebsiella pneumoniae NOT DETECTED NOT DETECTED Final   Proteus species NOT DETECTED NOT DETECTED Final   Salmonella species NOT DETECTED NOT DETECTED Final   Serratia marcescens NOT DETECTED NOT DETECTED Final   Haemophilus influenzae NOT DETECTED NOT DETECTED Final   Neisseria meningitidis NOT DETECTED NOT DETECTED Final   Pseudomonas aeruginosa NOT DETECTED NOT DETECTED Final   Stenotrophomonas maltophilia NOT DETECTED NOT DETECTED Final   Candida albicans NOT DETECTED NOT DETECTED Final   Candida auris NOT DETECTED NOT DETECTED Final   Candida glabrata NOT DETECTED NOT DETECTED Final   Candida krusei NOT DETECTED NOT DETECTED Final   Candida parapsilosis NOT DETECTED NOT DETECTED Final   Candida tropicalis NOT DETECTED NOT DETECTED Final   Cryptococcus neoformans/gattii NOT DETECTED NOT DETECTED Final   CTX-M ESBL NOT DETECTED NOT DETECTED Final   Carbapenem resistance IMP NOT DETECTED NOT DETECTED Final   Carbapenem resistance KPC NOT DETECTED NOT DETECTED Final   Carbapenem resistance NDM NOT DETECTED NOT DETECTED Final   Carbapenem resist OXA 48 LIKE NOT DETECTED NOT DETECTED Final   Carbapenem  resistance VIM NOT DETECTED NOT DETECTED Final    Comment: Performed at [facility], [address], [place], [place] [address]  Blood Culture (routine x 2)     Status: Abnormal   Collection Time: 06/11/23  4:08 PM   Specimen: BLOOD LEFT FOREARM  Result Value Ref Range Status   Specimen Description   Final    BLOOD LEFT FOREARM Performed at [facility], [address], [place], [place] [address]    Special Requests   Final    BOTTLES DRAWN AEROBIC AND ANAEROBIC Blood Culture adequate volume Performed at [facility], [address] [place], [place] [address]    Culture  Setup Time   Final    GRAM NEGATIVE RODS IN BOTH AEROBIC AND ANAEROBIC BOTTLES CRITICAL VALUE NOTED.  VALUE IS CONSISTENT WITH PREVIOUSLY REPORTED AND CALLED VALUE.    Culture (A)  Final    CITROBACTER FREUNDII SUSCEPTIBILITIES PERFORMED ON PREVIOUS CULTURE WITHIN THE LAST 5 DAYS. Performed at [facility], [address], [place], [place] [address]    Report Status 06/16/2023 FINAL  Final  Urine Culture     Status: Abnormal   Collection Time: 06/11/23  6:14 PM   Specimen: Urine, Random  Result Value Ref Range Status   Specimen Description   Final    URINE, RANDOM Performed at [facility], [address], [place], [place] [address]    Special Requests   Final    NONE Reflexed from [id number] Performed at [facility], [address] [place], [place] [address]    Culture >=100,000 COLONIES/mL CITROBACTER FREUNDII (A)  Final   Report Status 06/13/2023 FINAL  Final   Organism ID, Bacteria CITROBACTER FREUNDII (A)  Final      Susceptibility   Citrobacter freundii - MIC*    CEFEPIME <=0.12 SENSITIVE Sensitive     CEFTRIAXONE <=0.25 SENSITIVE Sensitive     CIPROFLOXACIN <=0.25 SENSITIVE Sensitive     GENTAMICIN <=1 SENSITIVE Sensitive     IMIPENEM 1 SENSITIVE Sensitive     NITROFURANTOIN <=16 SENSITIVE Sensitive     TRIMETH/SULFA <=20  SENSITIVE Sensitive     PIP/TAZO <=4 SENSITIVE Sensitive     * >=100,000 COLONIES/mL CITROBACTER FREUNDII    Radiology Studies: Overnight EEG with video  Result Date: 06/16/2023 Charlsie Quest, MD     06/16/2023  6:52 AM Patient Name: JAIMIE EDGECOMB MRN: [id number] Epilepsy Attending: Charlsie Quest Referring Physician/Provider: Erick Blinks, MD Duration: 06/16/2023 0005 to [phone] Patient history: 87 year old Caucasian male with past medical history significant for but limited to medial sphenoid meningioma status postradiation, chronic vasogenic edema who has been noncompliant with steroids, seizures, BPH as well as other comorbidities who presented with altered mental status. EEG to evaluate for seizure Level of alertness: Awake, asleep AEDs during EEG study: LEV, VPA Technical aspects: This EEG study was done with scalp electrodes positioned according to the 10-20 International system of electrode placement. Electrical activity was reviewed with band pass filter of 1-70Hz , sensitivity of 7 uV/mm, display speed of 1mm/sec with a 60Hz  notched filter applied as appropriate. EEG data were recorded continuously and digitally stored.  Video monitoring was available and reviewed as appropriate. Description: The posterior dominant rhythm consists of 8 Hz activity of moderate voltage (25-35 uV) seen predominantly in posterior head regions, symmetric and reactive to eye opening and eye closing. Sleep was characterized by vertex waves, sleep spindles (12 to 14 Hz), maximal frontocentral region. EEG showed intermittent 3 to 6 Hz theta-delta slowing in right temporo-parietal region. Spikes were noted in right frontal- anterior temporal region. Seizure without  clinical signs was noted arising from right frontal- anterior temporal region. EEG showed rhythmic sharply contoured 5Hz  theta slowing in right frontal- anterior temporal region which then involved all of right hemisphere as well as vertex region. EEG  then evolved into sharply contoured high amplitude 3-5Hz  theta-delta slowing. EEG then involved left frontal region. Onset of seizure on 06/16/2023 at 0151, lasting 1 minute 24 seconds. Hyperventilation and photic stimulation were not performed.   ABNORMALITY - Seizure without clinical signs, right frontal- anterior temporal region. - Spike, right frontal- anterior temporal region - Intermittent slow, right temporo-parietal region. IMPRESSION: This study showed one seizure without clinical signs arising from right frontal-anterior temporal region on 06/16/2023 at 0151 lasting about 1 minute 24 seconds.  Additionally there was evidence of epileptogenicity arising from right frontal-anterior temporal region.  Lastly the study was suggestive of cortical dysfunction arising from right temporo-parietal region, likely secondary to underlying structural abnormality. Priyanka Annabelle Harman   US RENAL  Result Date: 06/15/2023 CLINICAL DATA:  Follow-up hydronephrosis EXAM: RENAL / URINARY TRACT ULTRASOUND COMPLETE COMPARISON:  06/11/2023 CT FINDINGS: Right Kidney: Renal measurements: 10.3 x 4.5 x 6.4 cm. = volume: 155 mL. Echogenicity within normal limits. No mass or hydronephrosis visualized. Left Kidney: Renal measurements: 10.4 x 5.3 x 5.6 cm. = volume: 162 mL. Echogenicity within normal limits. No mass or hydronephrosis visualized. Bladder: Decompressed by Foley catheter. Other: None. IMPRESSION: Previously seen hydronephrosis has resolved following placement of Foley catheter. Changes were likely related to a distended bladder. No other focal abnormality is noted. Electronically Signed   By: Alcide Clever M.D.   On: 06/15/2023 20:41   EEG adult  Result Date: 06/15/2023 Charlsie Quest, MD     06/15/2023  2:19 PM Patient Name: Aaron Hickman MRN: [id number] Epilepsy Attending: Charlsie Quest Referring Physician/Provider: Merlene Laughter, DO Date: 06/15/2023 Duration: 23.38 mins Patient history: 87 year old  Caucasian male with past medical history significant for but limited to medial sphenoid meningioma status postradiation, chronic vasogenic edema who has been noncompliant with steroids, seizures, BPH as well as other comorbidities who presented with altered mental status. EEG to evaluate for seizure Level of alertness: Awake, asleep AEDs during EEG study: LEV Technical aspects: This EEG study was done with scalp electrodes positioned according to the 10-20 International system of electrode placement. Electrical activity was reviewed with band pass filter of 1-70Hz , sensitivity of 7 uV/mm, display speed of 54mm/sec with a 60Hz  notched filter applied as appropriate. EEG data were recorded continuously and digitally stored.  Video monitoring was available and reviewed as appropriate. Description: The posterior dominant rhythm consists of 8 Hz activity of moderate voltage (25-35 uV) seen predominantly in posterior head regions, symmetric and reactive to eye opening and eye closing. Sleep was characterized by vertex waves, sleep spindles (12 to 14 Hz), maximal frontocentral region.  EEG showed intermittent 3 to 6 Hz theta-delta slowing in right temporo-parietal region. Hyperventilation and photic stimulation were not performed.   ABNORMALITY - Intermittent slow, right temporo-parietal region. IMPRESSION: This study is suggestive of cortical dysfunction arising from right temporo-parietal region, likely secondary to underlying structural abnormality. No seizures or epileptiform discharges were seen throughout the recording. Priyanka Annabelle Harman       T.  [facility]  If 7PM-7AM, please contact night-coverage [email or web address] 06/16/2023, 1:53 PM

## 2023-06-16 NOTE — Plan of Care (Signed)
  Problem: Education: Goal: Knowledge of General Education information will improve Description: Including pain rating scale, medication(s)/side effects and non-pharmacologic comfort measures Outcome: Progressing   Problem: Health Behavior/Discharge Planning: Goal: Ability to manage health-related needs will improve Outcome: Progressing   Problem: Clinical Measurements: Goal: Ability to maintain clinical measurements within normal limits will improve Outcome: Progressing Goal: Will remain free from infection Outcome: Progressing Goal: Diagnostic test results will improve Outcome: Progressing   Problem: Coping: Goal: Level of anxiety will decrease Outcome: Progressing   Problem: Elimination: Goal: Will not experience complications related to bowel motility Outcome: Progressing   Problem: Pain Managment: Goal: General experience of comfort will improve Outcome: Progressing   Problem: Safety: Goal: Ability to remain free from injury will improve Outcome: Progressing   Problem: Skin Integrity: Goal: Risk for impaired skin integrity will decrease Outcome: Progressing   Problem: Activity: Goal: Risk for activity intolerance will decrease Outcome: Not Progressing   Problem: Nutrition: Goal: Adequate nutrition will be maintained Outcome: Not Progressing   Problem: Elimination: Goal: Will not experience complications related to urinary retention Outcome: Not Progressing

## 2023-06-16 NOTE — Consult Note (Signed)
NEUROLOGY CONSULTATION NOTE   Date of service: June 16, 2023 Patient Name: Aaron Hickman MRN:  [id number] DOB:  September 01, 1936 Reason for consult: "confusion" Requesting Provider: Merlene Laughter, DO _ _ _   _ __   _ __ _ _  __ __   _ __   __ _  History of Present Illness  Aaron Hickman is a 87 y.o. 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 In that setting, has had poor PO intake, not taking meds including Keppra. Recently treated for UTI. Has hx of recurrent UTI. He was admitted and UA concerning for a UTI and treated for urosepsis with antibiotics.  During hospitalization, has had periods of agitation, this persisted despite antipsychotics and concern that Keppra could be contributing to these. He was transferred to [facility] for further AED adjustment and LTM.   ROS   Constitutional Denies weight loss, fever and chills.   HEENT Denies changes in vision and hearing.   Respiratory Denies SOB and cough.   CV Denies palpitations and CP   GI Denies abdominal pain, nausea, vomiting and diarrhea.   GU Denies dysuria and urinary frequency.   MSK Denies myalgia and joint pain.   Skin Denies rash and pruritus.   Neurological Denies headache and syncope.   Psychiatric Denies recent changes in mood. Denies anxiety and depression.    Past History   Past Medical History:  Diagnosis Date   Blood clots in brain    Tumors    in head   No past surgical history on file. No family history on file. Social History   Socioeconomic History   Marital status: Married    Spouse name: Not on file   Number of children: Not on file   Years of education: Not on file   Highest education level: Not on file  Occupational History   Not on file  Tobacco Use   Smoking status: Former    Types: Cigarettes   Smokeless  tobacco: Never  Vaping Use   Vaping status: Never Used  Substance and Sexual Activity   Alcohol use: Not Currently   Drug use: Never   Sexual activity: Not on file  Other Topics Concern   Not on file  Social History Narrative   Are you right handed or left handed? Right handed    Are you currently employed ? 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 NO   Who lives with you? With family    What type of home do you live in: 1 story or 2 story? One story        Social Determinants of Health   Financial Resource Strain: Not on file  Food Insecurity: No Food Insecurity (03/31/2023)   Hunger Vital Sign    Worried About Running Out of Food in the Last Year: Never true    Ran Out of Food in the Last Year: Never true  Transportation Needs: No Transportation Needs (03/31/2023)   PRAPARE - Administrator, Civil Service (Medical): No    Lack of Transportation (Non-Medical): No  Physical Activity: Not on file  Stress: Not on file  Social Connections: Not on file   Allergies  Allergen Reactions   Iodinated Contrast Media Itching   Tamsulosin Other (  See Comments)    Upsets stomach    Medications   Medications Prior to Admission  Medication Sig Dispense Refill Last Dose   levETIRAcetam (KEPPRA) 500 MG tablet Take 1 tablet (500 mg total) by mouth 2 (two) times daily. 60 tablet 2 06/10/2023   levothyroxine (SYNTHROID) 50 MCG tablet Take 1 tablet (50 mcg total) by mouth daily at 6 (six) AM. 30 tablet 0 06/11/2023   Multiple Vitamin (MULTIVITAMIN WITH MINERALS) TABS tablet Take 1 tablet by mouth daily.   unklast dose   sodium chloride 1 g tablet Take 1 g by mouth 2 (two) times daily with a meal.   06/09/2023     Vitals   Vitals:   06/15/23 1408 06/15/23 1600 06/15/23 1922 06/15/23 2126  BP: (!) 167/84 (!) 146/83 (!) 160/84 (!) 142/75  Pulse: 63 75 79 72  Resp:   16 18  Temp:   97.9 F (36.6 C) 98.4 F (36.9 C)  TempSrc:    Oral  SpO2:    98% 98%  Weight:      Height:         Body mass index is 22.11 kg/m.  Physical Exam   General: Laying comfortably in bed; in no acute distress.  HENT: Normal oropharynx and mucosa. Normal external appearance of ears and nose.  Neck: Supple, no pain or tenderness  CV: No JVD. No peripheral edema.  Pulmonary: Symmetric Chest rise. Normal respiratory effort.  Abdomen: Soft to touch, non-tender.  Ext: No cyanosis, edema, or deformity  Skin: No rash. Normal palpation of skin.   Musculoskeletal: Normal digits and nails by inspection. No clubbing.   Neurologic Examination  Mental status/Cognition: Alert, oriented to self, place, but not to month and year, good attention.  Speech/language: Fluent, comprehension intact, object naming intact, repetition intact. Cranial nerves:   CN II Pupils equal and reactive to light, no VF deficits    CN III,IV,VI EOM intact, no gaze preference or deviation, no nystagmus    CN V normal sensation in V1, V2, and V3 segments bilaterally    CN VII no asymmetry, no nasolabial fold flattening    CN VIII Hard of hearing BL   CN IX & X normal palatal elevation, no uvular deviation    CN XI 5/5 head turn and 5/5 shoulder shrug bilaterally    CN XII midline tongue protrusion    Motor:  Muscle bulk: normal, tone normal. Moves all extremities spontaneously and antigravity and on commands.  Sensation:  Light touch Intact throughout   Pin prick    Temperature    Vibration   Proprioception    Coordination/Complex Motor:  - Finger to Nose intact BL - Rapid alternating movement are slowed BL - Gait: deferred for patient safety.  Labs   CBC:  Recent Labs  Lab 06/14/23 0543 06/15/23 1115  WBC 7.6 9.1  NEUTROABS 5.5 6.3  HGB 9.9* 10.5*  HCT 31.2* 34.1*  MCV 97.2 98.3  PLT 128* 160    Basic Metabolic Panel:  Lab Results  Component Value Date   NA 138 06/15/2023   K 3.5 06/15/2023   CO2 21 (L) 06/15/2023   GLUCOSE 150 (H) 06/15/2023   BUN 18  06/15/2023   CREATININE 0.98 06/15/2023   CALCIUM 7.8 (L) 06/15/2023   GFRNONAA >60 06/15/2023   Lipid Panel: No results found for: "LDLCALC" HgbA1c: No results found for: "HGBA1C" Urine Drug Screen:     Component Value Date/Time   LABOPIA NONE DETECTED 03/30/2023 1637  COCAINSCRNUR NONE DETECTED 03/30/2023 1637   LABBENZ NONE DETECTED 03/30/2023 1637   AMPHETMU NONE DETECTED 03/30/2023 1637   THCU NONE DETECTED 03/30/2023 1637   LABBARB NONE DETECTED 03/30/2023 1637    Alcohol Level     Component Value Date/Time   ETH <10 03/29/2023 1421    CT Head without contrast(Personally reviewed): Stable R temporal lobe vasogenic edema compared to prior CT Head from Jul 2024.  rEEG:  R temporal slowing  Impression   Aaron Hickman is a 87 y.o. male with meningioma c/b R temporal vasogenic edema and on keppra who is admitted with urosepsis and has periods of agitation. He is calm when I saw him. Suspect waxing and waning encephalopathy is likely due to delirium but will get LTM EEG to evaluate for subclinical seizure.  Keppra could be contributing to agitation and will therefor discontinue Keppra and load Depakote and start Depakote ER 750mg  daily.  Recommendations  - switched to [facility] ER 750mg  daily and stopped Keppra. - LTM EEG overnight and if no seizures, will discontinue in AM. ______________________________________________________________________   Thank you for the opportunity to take part in the care of this patient. If you have any further questions, please contact the neurology consultation attending.  Signed,  Erick Blinks [facility] _ _ _   _ __   _ __ _ _  __ __   _ __   __ _

## 2023-06-16 NOTE — Care Plan (Signed)
Was drowsy, did wake up to repeated verbal stimulation but couldn't answer any orientation questions, didn't follow commands. Wife not at bedside.  Switched VPA to vimpat due to hyperammonemia. Continue LTM eeg ntil sz free for 24 hours.  Has declined neurosurgical intervention in past ( 2023). May need neurosug consult again.   Will get mri brain w and wo contrast to further assess and compare growth of mass, edema   Annabelle Harman

## 2023-06-16 NOTE — TOC Initial Note (Signed)
Transition of Care [facility]) - Initial/Assessment Note    Patient Details  Name: Aaron Hickman MRN: [id number] Date of Birth: Jul 01, 1936  [facility]) CM/SW Contact:    Kermit Balo, RN Phone Number: 06/16/2023, 2:14 PM  Clinical Narrative:                 Pt is from  home with his spouse. She has been managing him at home. She states he is active with [facility] for [facility] therapies. She says the patient told them not to return but he has dementia and CM encouraged her to let them continue coming to the home. He has walker/ shower seat at the home but he refuses to use them. Wife does manage his medications. He sometimes refuses to take them or eat.  Wife or sister in law provides needed transportation. Wife states she plans to have him return home when medically ready.  TOC following.  Expected Discharge Plan: Home w Home Health Services Barriers to Discharge: Continued Medical Work up   Patient Goals and CMS Choice   CMS Medicare.gov Compare Post Acute Care list provided to:: Patient Represenative (must comment) Choice offered to / list presented to : Spouse      Expected Discharge Plan and Services   Discharge Planning Services: CM Consult Post Acute Care Choice: Home Health Living arrangements for the past 2 months: Single Family Home                           HH Arranged: RN, PT, OT, Nurse's Aide, Social Work [employer] Agency: [facility] Date [facility] Agency Contacted: 06/16/23   Representative spoke with at [facility] Agency: Kandee Keen  Prior Living Arrangements/Services Living arrangements for the past 2 months: Single Family Home Lives with:: Spouse Patient language and need for interpreter reviewed:: Yes        Need for Family Participation in Patient Care: Yes (Comment) Care giver support system in place?: Yes (comment) Current home services: DME (walker/ shower seat) Criminal Activity/Legal Involvement Pertinent to Current Situation/Hospitalization: No -  Comment as needed  Activities of Daily Living      Permission Sought/Granted                  Emotional Assessment Appearance:: Appears stated age Attitude/Demeanor/Rapport:  (confused)   Orientation: : Oriented to Self   Psych Involvement: No (comment)  Admission diagnosis:  Acute cystitis without hematuria [N30.00] AKI (acute kidney injury) (HCC) [N17.9] Sepsis (HCC) [A41.9] Altered mental status, unspecified altered mental status type [R41.82] Patient Active Problem List   Diagnosis Date Noted   Sepsis secondary to UTI (HCC) 06/11/2023   Metabolic acidosis, normal anion gap (NAG) 06/11/2023   Acute renal failure (ARF) (HCC) 06/11/2023   Bilateral hydronephrosis 06/11/2023   Acute metabolic encephalopathy 06/11/2023   Protein-calorie malnutrition, severe 04/02/2023   Hyponatremia 03/30/2023   Meningioma (HCC) 03/30/2023   Seizure (HCC) 03/30/2023   PCP:  [facility] Pharmacy:   [id number] [place], [place] - [address] AT [facility] OF [address] [place] [address] Phone: [phone] Fax: [phone]     Social Determinants of Health (SDOH) Social History: SDOH Screenings   Food Insecurity: No Food Insecurity (03/31/2023)  Housing: Low Risk  (03/31/2023)  Transportation Needs: No Transportation Needs (03/31/2023)  Utilities: Not At Risk (03/31/2023)  Tobacco Use: Medium Risk (05/21/2023)  SDOH Interventions:     Readmission Risk Interventions    06/14/2023   11:42 AM  Readmission Risk Prevention Plan  Transportation Screening Complete  PCP or Specialist Appt within 3-5 Days Complete  HRI or Home Care Consult Complete  Social Work Consult for Recovery Care Planning/Counseling Complete  Palliative Care Screening Not Applicable  Medication Review Oceanographer) Complete

## 2023-06-17 ENCOUNTER — Inpatient Hospital Stay (HOSPITAL_COMMUNITY): Payer: PPO

## 2023-06-17 DIAGNOSIS — R4182 Altered mental status, unspecified: Secondary | ICD-10-CM | POA: Diagnosis not present

## 2023-06-17 DIAGNOSIS — R569 Unspecified convulsions: Secondary | ICD-10-CM | POA: Diagnosis not present

## 2023-06-17 DIAGNOSIS — E872 Acidosis, unspecified: Secondary | ICD-10-CM | POA: Diagnosis not present

## 2023-06-17 DIAGNOSIS — N1330 Unspecified hydronephrosis: Secondary | ICD-10-CM | POA: Diagnosis not present

## 2023-06-17 DIAGNOSIS — A419 Sepsis, unspecified organism: Secondary | ICD-10-CM | POA: Diagnosis not present

## 2023-06-17 DIAGNOSIS — N390 Urinary tract infection, site not specified: Secondary | ICD-10-CM | POA: Diagnosis not present

## 2023-06-17 DIAGNOSIS — D329 Benign neoplasm of meninges, unspecified: Secondary | ICD-10-CM | POA: Diagnosis not present

## 2023-06-17 LAB — COMPREHENSIVE METABOLIC PANEL
ALT: 17 U/L (ref 0–44)
AST: 24 U/L (ref 15–41)
Albumin: 2.7 g/dL — ABNORMAL LOW (ref 3.5–5.0)
Alkaline Phosphatase: 47 U/L (ref 38–126)
Anion gap: 15 (ref 5–15)
BUN: 11 mg/dL (ref 8–23)
CO2: 19 mmol/L — ABNORMAL LOW (ref 22–32)
Calcium: 7.9 mg/dL — ABNORMAL LOW (ref 8.9–10.3)
Chloride: 103 mmol/L (ref 98–111)
Creatinine, Ser: 1.1 mg/dL (ref 0.61–1.24)
GFR, Estimated: >60 mL/min (ref >60)
Glucose, Bld: 97 mg/dL (ref 70–99)
Potassium: 4.1 mmol/L (ref 3.5–5.1)
Sodium: 137 mmol/L (ref 135–145)
Total Bilirubin: 0.8 mg/dL (ref 0.3–1.2)
Total Protein: 5.5 g/dL — ABNORMAL LOW (ref 6.5–8.1)

## 2023-06-17 LAB — RENAL FUNCTION PANEL
Albumin: 2.5 g/dL — ABNORMAL LOW (ref 3.5–5.0)
Anion gap: 8 (ref 5–15)
BUN: 9 mg/dL (ref 8–23)
CO2: 27 mmol/L (ref 22–32)
Calcium: 8.1 mg/dL — ABNORMAL LOW (ref 8.9–10.3)
Chloride: 102 mmol/L (ref 98–111)
Creatinine, Ser: 1.12 mg/dL (ref 0.61–1.24)
GFR, Estimated: >60 mL/min (ref >60)
Glucose, Bld: 84 mg/dL (ref 70–99)
Phosphorus: 2.1 mg/dL — ABNORMAL LOW (ref 2.5–4.6)
Potassium: 3.9 mmol/L (ref 3.5–5.1)
Sodium: 137 mmol/L (ref 135–145)

## 2023-06-17 LAB — GLUCOSE, CAPILLARY: Glucose-Capillary: 111 mg/dL — ABNORMAL HIGH (ref 70–99)

## 2023-06-17 LAB — CBC
HCT: 34 % — ABNORMAL LOW (ref 39.0–52.0)
HCT: 35.8 % — ABNORMAL LOW (ref 39.0–52.0)
Hemoglobin: 10.8 g/dL — ABNORMAL LOW (ref 13.0–17.0)
Hemoglobin: 11.1 g/dL — ABNORMAL LOW (ref 13.0–17.0)
MCH: 29.8 pg (ref 26.0–34.0)
MCH: 29.8 pg (ref 26.0–34.0)
MCHC: 31.8 g/dL (ref 30.0–36.0)
MCHC: 31 g/dL (ref 30.0–36.0)
MCV: 93.9 fL (ref 80.0–100.0)
MCV: 96 fL (ref 80.0–100.0)
Platelets: 198 10*3/uL (ref 150–400)
Platelets: 193 10*3/uL (ref 150–400)
RBC: 3.62 MIL/uL — ABNORMAL LOW (ref 4.22–5.81)
RBC: 3.73 MIL/uL — ABNORMAL LOW (ref 4.22–5.81)
RDW: 13.5 % (ref 11.5–15.5)
RDW: 13.4 % (ref 11.5–15.5)
WBC: 6.3 10*3/uL (ref 4.0–10.5)
WBC: 5.9 10*3/uL (ref 4.0–10.5)
nRBC: 0 % (ref 0.0–0.2)
nRBC: 0 % (ref 0.0–0.2)

## 2023-06-17 LAB — LACTIC ACID, PLASMA: Lactic Acid, Venous: 2.3 mmol/L (ref 0.5–1.9)

## 2023-06-17 LAB — MAGNESIUM: Magnesium: 1.7 mg/dL (ref 1.7–2.4)

## 2023-06-17 MED ORDER — SODIUM CHLORIDE 0.9 % IV SOLN
75.0000 mg | Freq: Two times a day (BID) | INTRAVENOUS | Status: AC
  Administered 2023-06-17 – 2023-06-19 (×4): 75 mg via INTRAVENOUS
  Filled 2023-06-17 (×5): qty 7.5

## 2023-06-17 MED ORDER — GERHARDT'S BUTT CREAM
TOPICAL_CREAM | Freq: Two times a day (BID) | CUTANEOUS | Status: DC
  Administered 2023-06-17 – 2023-06-20 (×3): 1 via TOPICAL
  Filled 2023-06-17: qty 1

## 2023-06-17 MED ORDER — GADOBUTROL 1 MMOL/ML IV SOLN
7.0000 mL | Freq: Once | INTRAVENOUS | Status: AC | PRN
  Administered 2023-06-17: 7 mL via INTRAVENOUS

## 2023-06-17 MED ORDER — TAMSULOSIN HCL 0.4 MG PO CAPS
0.4000 mg | ORAL_CAPSULE | Freq: Every day | ORAL | Status: DC
  Administered 2023-06-17 – 2023-06-20 (×6): 0.4 mg via ORAL
  Filled 2023-06-17 (×5): qty 1

## 2023-06-17 MED ORDER — POTASSIUM PHOSPHATES 15 MMOLE/5ML IV SOLN
15.0000 mmol | Freq: Once | INTRAVENOUS | Status: DC
  Filled 2023-06-17: qty 5

## 2023-06-17 MED ORDER — SODIUM CHLORIDE 0.9 % IV BOLUS
500.0000 mL | Freq: Once | INTRAVENOUS | Status: AC | PRN
  Administered 2023-06-17: 500 mL via INTRAVENOUS

## 2023-06-17 MED ORDER — LORAZEPAM 2 MG/ML IJ SOLN
1.0000 mg | Freq: Once | INTRAMUSCULAR | Status: AC
  Administered 2023-06-17: 1 mg via INTRAVENOUS
  Filled 2023-06-17: qty 1

## 2023-06-17 MED ORDER — MIDAZOLAM HCL 2 MG/2ML IJ SOLN: 2.0000 mg | INTRAMUSCULAR | Status: DC | PRN

## 2023-06-17 MED ORDER — SODIUM CHLORIDE 0.9 % IV SOLN: INTRAVENOUS | Status: AC

## 2023-06-17 NOTE — Progress Notes (Signed)
During bedside rounds with dayshift nurse, blood pressure on monitor shows 46/35, rechecked  and shows 52/40, patient unresponsive to verbal and painful stimuli, rapid response called , Minday RN informed.

## 2023-06-17 NOTE — Progress Notes (Signed)
OT Cancellation Note  Patient Details Name: Aaron Hickman MRN: [id number] DOB: [birth year]   Cancelled Treatment:    Reason Eval/Treat Not Completed: Fatigue/lethargy limiting ability to participate. Pt sleeping heavily, however with tactile stimuli, pt opened eyes briefly and said "hello" to therapist. Pt unable to keep eyes open and very sleepy at this time. OT will follow up next available time  Galen Manila 06/17/2023, 11:41 AM

## 2023-06-17 NOTE — Progress Notes (Signed)
Noted patient"s blood pressure at 46/35 during rounds, rechecked and shows 52/40, patient unrespionsive, rapid response called, noted Red mews, Mindy RRT at bedside, dr. Janalyn Shy made aware of situation, en route to assess patient. MEWS protocol implemented.    06/17/23 1907 06/17/23 1909 06/17/23 1917  Assess: MEWS Score  Temp  --   --  98.1 F (36.7 C)  BP (!) 52/40 (!) 55/38 (!) 106/52  MAP (mmHg) (!) 45 (!) 45 69  Pulse Rate  --   --  89  ECG Heart Rate 80 76 85  Resp 20 17 12   Level of Consciousness  --   --  Unresponsive  SpO2  --   --  96 %  O2 Device  --   --  Room Air  Assess: MEWS Score  MEWS Temp 0 0 0  MEWS Systolic 3 3 0  MEWS Pulse 0 0 0  MEWS RR 0 0 1  MEWS LOC 0 0 3  MEWS Score 3 3 4   MEWS Score Color Yellow Yellow Red  Assess: if the MEWS score is Yellow or Red  Were vital signs accurate and taken at a resting state?  --   --  Yes  Does the patient meet 2 or more of the SIRS criteria?  --   --  No  MEWS guidelines implemented   --   --  Yes, red  Treat  MEWS Interventions  --   --  Considered administering scheduled or prn medications/treatments as ordered  Take Vital Signs  Increase Vital Sign Frequency   --   --  Red: Q1hr x2, continue Q4hrs until patient remains green for 12hrs  Escalate  MEWS: Escalate  --   --  Red: Discuss with charge nurse and notify provider. Consider notifying RRT. If remains red for 2 hours consider need for higher level of care  Notify: Charge Nurse/RN  Name of Charge Nurse/RN Notified  --   --  [place] RN  Provider Notification  Provider Name/Title  --   --  Dr. Janalyn Shy  Date Provider Notified  --   --  06/17/23  Time Provider Notified  --   --  1925  Method of Notification  --   --  Page  Notification Reason  --   --  Change in status  Provider response  --   --  En route  Date of Provider Response  --   --  06/17/23  Time of Provider Response  --   --  1925  Notify: Rapid Response  Name of Rapid Response RN Notified  --   --   Mindy  Date Rapid Response Notified  --   --  06/17/23  Time Rapid Response Notified  --   --  1907  Assess: SIRS CRITERIA  SIRS Temperature  0 0 0  SIRS Pulse 0 0 0  SIRS Respirations  0 0 0  SIRS WBC 0 0 0  SIRS Score Sum  0 0 0    06/17/23 1945  Assess: MEWS Score  Temp (!) 97.5 F (36.4 C)  BP (!) 142/130  MAP (mmHg) 134  Pulse Rate 65  ECG Heart Rate  --   Resp 17  Level of Consciousness  --   SpO2 100 %  O2 Device Room Air  Assess: MEWS Score  MEWS Temp 0  MEWS Systolic 0  MEWS Pulse 0  MEWS RR 0  MEWS LOC 3  MEWS Score 3  MEWS Score Color  Yellow  Assess: if the MEWS score is Yellow or Red  Were vital signs accurate and taken at a resting state?  --   Does the patient meet 2 or more of the SIRS criteria?  --   MEWS guidelines implemented   --   Treat  MEWS Interventions  --   Take Vital Signs  Increase Vital Sign Frequency   --   Escalate  MEWS: Escalate  --   Notify: Charge Nurse/RN  Name of Charge Nurse/RN Notified  --   Provider Notification  Provider Name/Title  --   Date Provider Notified  --   Time Provider Notified  --   Method of Notification  --   Notification Reason  --   Provider response  --   Date of Provider Response  --   Time of Provider Response  --   Notify: Rapid Response  Name of Rapid Response RN Notified  --   Date Rapid Response Notified  --   Time Rapid Response Notified  --   Assess: SIRS CRITERIA  SIRS Temperature  0  SIRS Pulse 0  SIRS Respirations  0  SIRS WBC 0  SIRS Score Sum  0

## 2023-06-17 NOTE — Progress Notes (Signed)
PROGRESS NOTE  Aaron Hickman NFA:[id number] DOB: [birth year]   PCP: [facility]  Patient is from: Home.  Lives with his wife.  DOA: 06/11/2023 LOS: 6  Chief complaints Chief Complaint  Patient presents with   Altered Mental Status     Brief Narrative / Interim history: 87 year old M with PMH of seizure disorder, meningioma, chronic vasogenic edema, BPH and noncompliance with medication presenting with altered mental status and decreased p.o. intake, and admitted for sepsis due to urinary tract infection, acute metabolic encephalopathy with agitation and AKI.  He was febrile 201 and tachycardic.  Had leukocytosis to 28.3.  UA concerning for UTI.  Cultures obtained.  Started on IV ceftriaxone and IV fluid.  Later, urine and blood culture with pansensitive Citrobacter fruendii.  Initially, antibiotic escalated to cefepime and then de-escalated to Bactrim.  Patient has some behavioral issues and agitation for which she was started on Haldol and Zyprexa.  Psychiatry consulted and adjusted his Zyprexa and recommended neurology consultation due to concern about his Keppra contributing to behavioral disturbance.  Neurology recommended transfer to [facility] and changed his antiepileptics and started continuous EEG.  Neurology changed Keppra to Depakote and then to Vimpat due to elevated ammonia with Depakote.  Continuous EEG with subclinical seizure on 8/14 and no seizure on 8/15. EEG discontinued.  MRI brain with without contrast ordered.  Neurology following.     Subjective: Seen and examined earlier this morning.  No major events overnight of this morning.  Sleepy but wakes to voice.  Incomprehensible speech.  Does not follow command.  Objective: Vitals:   06/16/23 1925 06/16/23 2306 06/17/23 0300 06/17/23 1101  BP: 138/71 (!) 144/80 (!) 153/87   Pulse: 74 79 77 87  Resp: 18 18 14    Temp: 98.3 F (36.8 C) 98.6 F (37 C) 98.7 F (37.1 C) 97.9 F (36.6 C)  TempSrc:  Oral Oral Oral Oral  SpO2: 100%  100% 97%  Weight:      Height:        Examination:  GENERAL: No apparent distress.  Nontoxic. HEENT: MMM.  Vision and hearing grossly intact.  NECK: Supple.  No apparent JVD.  RESP:  No IWOB.  Fair aeration bilaterally. CVS:  RRR. Heart sounds normal.  ABD/GI/GU: BS+. Abd soft, NTND.  MSK/EXT:  Moves extremities. No apparent deformity. No edema.  Bilateral mittens and TED hose in place. SKIN: no apparent skin lesion or wound NEURO: Sleepy but wakes to voice.  Does not appear to be in distress.  Principal speech and difficult to understand.  Does not follow command.  No focal neurodeficit but limited exam due to mental status. PSYCH: Calm.  No agitation.  Procedures:  None  Microbiology summarized: COVID-19, influenza and RSV PCR nonreactive Blood and urine culture with pansensitive Citrobacter freundii  Assessment and plan: Principal Problem:   Sepsis secondary to UTI [facility]) Active Problems:   Meningioma (HCC)   Seizure (HCC)   Metabolic acidosis, normal anion gap (NAG)   Acute renal failure (ARF) (HCC)   Bilateral hydronephrosis   Acute metabolic encephalopathy  Severe sepsis due to Citrobacter freundii UTI and bacteremia: POA.  Has fever, leukocytosis, tachycardia with AKI and mental status change on presentation.  Culture data as above.   -Ceftriaxone >> cefepime >> Bactrim for a total of 7 days. -Since Foley was inserted on admission.  Start Flomax.  Voiding trial when able to get out of the bed.  Acute metabolic encephalopathy/agitation: Multifactorial including sepsis, delirium, subclinical  seizure and Keppra.  LTM EEG with subclinical seizure on 8/14, and no seizure on 8/15.  Patient is confused, and speech incomprehensible.  Does not follow command.  He is not agitated or combative. -Treat reversible causes. -Continue Vimpat per neurology. -Continue Zyprexa per psych. -Appreciate input by neurology-discontinued continuous EEG and  ordered MRI brain with and without contrast -Neurosurgical consult depending on MRI results and discussion with family.  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 Known lesion abutting the right cavernous sinus is poorly visualized due to CT technique.  Radiology recommended considering MRI for definitive characterization.  Previous attending discussed with neuro-oncology, Dr. Asher Muir who felt CT finding to be chronic and recommended no further imaging. -Neurology getting MRI brain +/- neurosurgical consultation based on MRI result and discussion with family   Seizure disorder: No clinical seizure noted.  EEG with subclinical seizure -Management as above   Bilateral Hydronephrosis/possible BOO: Likely due to to enlarged prostate.  CT abdomen and pelvis with mild bilateral hydronephrosis.  Foley catheter placed in ED. Unclear if he came in with chronic Foley -Continue Foley catheter for now -Start Flomax -Voiding trial when able to get out of bed   AKI/metabolic acidosis: Resolved. Recent Labs    04/02/23 0618 05/17/23 1300 05/21/23 1430 06/11/23 1605 06/12/23 0552 06/13/23 0526 06/14/23 0543 06/15/23 1115 06/16/23 1032 06/17/23 0019  BUN 24* 15 28* 66* 57* 36* 23 18 13 9   CREATININE 1.02 1.10 1.17 3.92* 2.06* 1.06 0.81 0.98 0.96 1.12  -Continue monitoring -Avoid nephrotoxic meds   Normocytic anemia/thrombocytopenia: Stable.  Anemia panel unrevealing. Recent Labs    03/30/23 1505 03/31/23 0202 05/17/23 1300 05/21/23 1430 06/11/23 1605 06/12/23 0552 06/13/23 0526 06/14/23 0543 06/15/23 1115 06/17/23 0019  HGB 12.6* 12.4* 11.7* 11.0* 9.4* 9.1* 8.9* 9.9* 10.5* 10.8*  -Continue monitoring  Hypokalemia/hypomagnesemia/hypophosphatemia -Monitor replenish as appropriate   Syncopal episode: Reportedly  ambulated and passed out while in the hospital.  BP dropped to 56/44. -Check orthostatic vitals if able -Fall precaution  Hypothyroidism: TSH low.  Free T4 slightly elevated to 1.22.  No clinical signs of hyperthyroidism. -Continue Synthroid -Recheck TFT in 4 to 6 weeks   Physical conditioning -PT/OT    Body mass index is 22.11 kg/m.   DVT prophylaxis:  Place TED hose Start: 06/15/23 1119 enoxaparin (LOVENOX) injection 40 mg Start: 06/14/23 1000  Code Status: Full code Family Communication: None at bedside Level of care: Telemetry Medical Status is: Inpatient Remains inpatient appropriate because: Acute encephalopathy, bacteremia, seizure   Final disposition: TBD Consultants:  Psychiatry Neurology Neuro-oncology  55 minutes with more than 50% spent in reviewing records, counseling patient/family and coordinating care.   Sch Meds:  Scheduled Meds:  Chlorhexidine Gluconate Cloth  6 each Topical Daily   enoxaparin (LOVENOX) injection  40 mg Subcutaneous Q24H   Gerhardt's butt cream   Topical BID   lacosamide  75 mg Oral BID   levothyroxine  50 mcg Oral Q0600   melatonin  3 mg Oral QHS   multivitamin with minerals  1 tablet Oral Daily   OLANZapine zydis  2.5 mg Oral Daily   OLANZapine zydis  5 mg Oral QHS   sodium chloride  1 g Oral BID WC   sulfamethoxazole-trimethoprim  1 tablet Oral BID   tamsulosin  0.4 mg Oral QPC supper   Continuous Infusions: PRN Meds:.haloperidol lactate  Antimicrobials: Anti-infectives (From admission, onward)    Start     Dose/Rate Route Frequency Ordered Stop   06/14/23 2200  sulfamethoxazole-trimethoprim (BACTRIM DS) 800-160 MG per tablet 1 tablet        1 tablet Oral 2 times daily 06/14/23 1750     06/14/23 1400  ceFEPIme (MAXIPIME) 2 g in sodium chloride 0.9 % 100 mL IVPB  Status:  Discontinued        2 g 200 mL/hr over 30 Minutes Intravenous Every 8 hours 06/14/23 1228 06/14/23 1749   06/12/23 1600  cefTRIAXone (ROCEPHIN) 2 g  in sodium chloride 0.9 % 100 mL IVPB  Status:  Discontinued        2 g 200 mL/hr over 30 Minutes Intravenous Every 24 hours 06/11/23 2104 06/14/23 1228   06/11/23 1615  cefTRIAXone (ROCEPHIN) 1 g in sodium chloride 0.9 % 100 mL IVPB  Status:  Discontinued        1 g 200 mL/hr over 30 Minutes Intravenous Every 24 hours 06/11/23 1603 06/11/23 1608   06/11/23 1615  cefTRIAXone (ROCEPHIN) 2 g in sodium chloride 0.9 % 100 mL IVPB        2 g 200 mL/hr over 30 Minutes Intravenous  Once 06/11/23 1608 06/11/23 1741        I have personally reviewed the following labs and images: CBC: Recent Labs  Lab 06/11/23 1605 06/12/23 0552 06/13/23 0526 06/14/23 0543 06/15/23 1115 06/17/23 0019  WBC 28.3* 25.3* 13.1* 7.6 9.1 6.3  NEUTROABS 23.4*  --  11.2* 5.5 6.3  --   HGB 9.4* 9.1* 8.9* 9.9* 10.5* 10.8*  HCT 29.3* 28.5* 29.7* 31.2* 34.1* 34.0*  MCV 97.0 98.3 102.1* 97.2 98.3 93.9  PLT 139* 117* 118* 128* 160 198   BMP &GFR Recent Labs  Lab 06/13/23 0526 06/14/23 0543 06/15/23 1115 06/16/23 1032 06/17/23 0019  NA 144 141 138 139 137  K 3.6 3.1* 3.5 3.8 3.9  CL 113* 108 107 103 102  CO2 21* 23 21* 26 27  GLUCOSE 73 77 150* 104* 84  BUN 36* 23 18 13 9   CREATININE 1.06 0.81 0.98 0.96 1.12  CALCIUM 8.5* 8.3* 7.8* 8.1* 8.1*  MG 1.7 1.7 1.4* 2.1 1.7  PHOS 2.5 2.9 2.9 2.3* 2.1*   Estimated Creatinine Clearance: 48.5 mL/min (by C-G formula based on SCr of 1.12 mg/dL). Liver & Pancreas: Recent Labs  Lab 06/11/23 1605 06/13/23 0526 06/14/23 0543 06/15/23 1115 06/16/23 1032 06/17/23 0019  AST 30 24 29 22  32  --   ALT 17 15 20 17 24   --   ALKPHOS 64 54 52 52 47  --   BILITOT 0.8 0.5 0.6 1.0 0.2*  --   PROT 5.3* 4.9* 4.9* 5.4* 5.2*  --   ALBUMIN 2.6* 2.2* 2.4* 2.7* 2.5* 2.5*   No results for input(s): "LIPASE", "AMYLASE" in the last 168 hours. Recent Labs  Lab 06/16/23 0241  AMMONIA 37*   Diabetic: No results for input(s): "HGBA1C" in the last 72 hours. Recent Labs  Lab  06/11/23 1600 06/14/23 0732 06/15/23 1107 06/16/23 2105  GLUCAP 113* 63* 150* 102*   Cardiac Enzymes: Recent Labs  Lab 06/16/23 1032  CKTOTAL 19*   No results for input(s): "PROBNP" in the last 8760 hours. Coagulation Profile: Recent Labs  Lab 06/11/23 1605  INR 1.7*   Thyroid Function Tests: Recent Labs    06/16/23 0241 06/16/23 1032  TSH 0.302*  --   FREET4  --  1.22*  Lipid Profile: No results for input(s): "CHOL", "HDL", "LDLCALC", "TRIG", "CHOLHDL", "LDLDIRECT" in the last 72 hours. Anemia Panel: No results for input(s): "VITAMINB12", "FOLATE", "FERRITIN", "TIBC", "IRON", "RETICCTPCT" in the last 72 hours. Urine analysis:    Component Value Date/Time   COLORURINE YELLOW 06/11/2023 1814   APPEARANCEUR CLEAR 06/11/2023 1814   LABSPEC 1.010 06/11/2023 1814   PHURINE 5.0 06/11/2023 1814   GLUCOSEU NEGATIVE 06/11/2023 1814   HGBUR LARGE (A) 06/11/2023 1814   BILIRUBINUR NEGATIVE 06/11/2023 1814   KETONESUR NEGATIVE 06/11/2023 1814   PROTEINUR NEGATIVE 06/11/2023 1814   NITRITE POSITIVE (A) 06/11/2023 1814   LEUKOCYTESUR MODERATE (A) 06/11/2023 1814   Sepsis Labs: Invalid input(s): "PROCALCITONIN", "LACTICIDVEN"  Microbiology: Recent Results (from the past 240 hour(s))  Resp panel by RT-PCR (RSV, Flu A&B, Covid) Anterior Nasal Swab     Status: None   Collection Time: 06/11/23  4:05 PM   Specimen: Anterior Nasal Swab  Result Value Ref Range Status   SARS Coronavirus 2 by RT PCR NEGATIVE NEGATIVE Final    Comment: (NOTE) SARS-CoV-2 target nucleic acids are NOT DETECTED.  The SARS-CoV-2 RNA is generally detectable in upper respiratory specimens during the acute phase of infection. The lowest concentration of SARS-CoV-2 viral copies this assay can detect is 138 copies/mL. A negative result does not preclude SARS-Cov-2 infection and should not be used as the sole basis for treatment or other patient management decisions. A negative result may occur with   improper specimen collection/handling, submission of specimen other than nasopharyngeal swab, presence of viral mutation(s) within the areas targeted by this assay, and inadequate number of viral copies(<138 copies/mL). A negative result must be combined with clinical observations, patient history, and epidemiological information. The expected result is Negative.  Fact Sheet for Patients:  BloggerCourse.com  Fact Sheet for Healthcare Providers:  SeriousBroker.it  This test is no t yet approved or cleared by the [place] FDA and  has been authorized for detection and/or diagnosis of SARS-CoV-2 by FDA under an Emergency Use Authorization (EUA). This EUA will remain  in effect (meaning this test can be used) for the duration of the COVID-19 declaration under Section 564(b)(1) of the Act, 21 U.S.C.section 360bbb-3(b)(1), unless the authorization is terminated  or revoked sooner.       Influenza A by PCR NEGATIVE NEGATIVE Final   Influenza B by PCR NEGATIVE NEGATIVE Final    Comment: (NOTE) The Xpert Xpress SARS-CoV-2/FLU/RSV plus assay is intended as an aid in the diagnosis of influenza from Nasopharyngeal swab specimens and should not be used as a sole basis for treatment. Nasal washings and aspirates are unacceptable for Xpert Xpress SARS-CoV-2/FLU/RSV testing.  Fact Sheet for Patients: BloggerCourse.com  Fact Sheet for Healthcare Providers: SeriousBroker.it  This test is not yet approved or cleared by the [place] FDA and has been authorized for detection and/or diagnosis of SARS-CoV-2 by FDA under an Emergency Use Authorization (EUA). This EUA will remain in effect (meaning this test can be used) for the duration of the COVID-19 declaration under Section 564(b)(1) of the Act, 21 U.S.C. section 360bbb-3(b)(1), unless the authorization is terminated or revoked.      Resp Syncytial Virus by PCR NEGATIVE NEGATIVE Final    Comment: (NOTE) Fact Sheet for Patients: BloggerCourse.com  Fact Sheet for Healthcare Providers: SeriousBroker.it  This test is not yet approved or cleared by the [place] FDA and has been authorized for detection and/or diagnosis of SARS-CoV-2 by FDA under an Emergency Use Authorization (EUA). This EUA  will remain in effect (meaning this test can be used) for the duration of the COVID-19 declaration under Section 564(b)(1) of the Act, 21 U.S.C. section 360bbb-3(b)(1), unless the authorization is terminated or revoked.  Performed at [facility], [address] [place], [place] [address]   Blood Culture (routine x 2)     Status: Abnormal   Collection Time: 06/11/23  4:05 PM   Specimen: BLOOD  Result Value Ref Range Status   Specimen Description   Final    BLOOD SITE NOT SPECIFIED Performed at [facility], [address], [place], [place] [address]    Special Requests   Final    BOTTLES DRAWN AEROBIC AND ANAEROBIC Blood Culture adequate volume Performed at [facility], [address] [place], [place] [address]    Culture  Setup Time   Final    GRAM NEGATIVE RODS IN BOTH AEROBIC AND ANAEROBIC BOTTLES CRITICAL RESULT CALLED TO, READ BACK BY AND VERIFIED WITH: PHARMD ANH PHAM [phone] BY Berline Chough, [place] Performed at [facility], [address] [place], [place] [address]    Culture CITROBACTER FREUNDII (A)  Final   Report Status 06/14/2023 FINAL  Final   Organism ID, Bacteria CITROBACTER FREUNDII  Final      Susceptibility   Citrobacter freundii - MIC*    CEFEPIME <=0.12 SENSITIVE Sensitive     CEFTAZIDIME <=1 SENSITIVE Sensitive     CEFTRIAXONE <=0.25 SENSITIVE Sensitive     CIPROFLOXACIN <=0.25 SENSITIVE Sensitive     GENTAMICIN <=1 SENSITIVE Sensitive     IMIPENEM 0.5 SENSITIVE Sensitive      TRIMETH/SULFA <=20 SENSITIVE Sensitive     PIP/TAZO <=4 SENSITIVE Sensitive     * CITROBACTER FREUNDII  Blood Culture ID Panel (Reflexed)     Status: Abnormal   Collection Time: 06/11/23  4:05 PM  Result Value Ref Range Status   Enterococcus faecalis NOT DETECTED NOT DETECTED Final   Enterococcus Faecium NOT DETECTED NOT DETECTED Final   Listeria monocytogenes NOT DETECTED NOT DETECTED Final   Staphylococcus species NOT DETECTED NOT DETECTED Final   Staphylococcus aureus (BCID) NOT DETECTED NOT DETECTED Final   Staphylococcus epidermidis NOT DETECTED NOT DETECTED Final   Staphylococcus lugdunensis NOT DETECTED NOT DETECTED Final   Streptococcus species NOT DETECTED NOT DETECTED Final   Streptococcus agalactiae NOT DETECTED NOT DETECTED Final   Streptococcus pneumoniae NOT DETECTED NOT DETECTED Final   Streptococcus pyogenes NOT DETECTED NOT DETECTED Final   A.calcoaceticus-baumannii NOT DETECTED NOT DETECTED Final   Bacteroides fragilis NOT DETECTED NOT DETECTED Final   Enterobacterales DETECTED (A) NOT DETECTED Final    Comment: Enterobacterales represent a large order of gram negative bacteria, not a single organism. Refer to culture for further identification. CRITICAL RESULT CALLED TO, READ BACK BY AND VERIFIED WITH: PHARMD ANH PHAM [phone] [phone] BY J [email or web address], MT    Enterobacter cloacae complex NOT DETECTED NOT DETECTED Final   Escherichia coli NOT DETECTED NOT DETECTED Final   Klebsiella aerogenes NOT DETECTED NOT DETECTED Final   Klebsiella oxytoca NOT DETECTED NOT DETECTED Final   Klebsiella pneumoniae NOT DETECTED NOT DETECTED Final   Proteus species NOT DETECTED NOT DETECTED Final   Salmonella species NOT DETECTED NOT DETECTED Final   Serratia marcescens NOT DETECTED NOT DETECTED Final   Haemophilus influenzae NOT DETECTED NOT DETECTED Final   Neisseria meningitidis NOT DETECTED NOT DETECTED Final   Pseudomonas aeruginosa NOT DETECTED NOT DETECTED Final   Stenotrophomonas  maltophilia NOT DETECTED NOT DETECTED Final   Candida albicans NOT DETECTED NOT DETECTED Final   Candida auris NOT DETECTED NOT DETECTED Final   Candida glabrata NOT DETECTED NOT DETECTED Final   Candida krusei NOT DETECTED NOT DETECTED Final   Candida parapsilosis NOT DETECTED NOT DETECTED Final   Candida tropicalis NOT DETECTED NOT DETECTED Final   Cryptococcus neoformans/gattii NOT DETECTED NOT DETECTED Final   CTX-M ESBL NOT DETECTED NOT DETECTED Final   Carbapenem resistance IMP NOT DETECTED NOT DETECTED Final   Carbapenem resistance KPC NOT DETECTED NOT DETECTED Final   Carbapenem resistance NDM NOT DETECTED NOT DETECTED Final   Carbapenem resist OXA 48 LIKE NOT DETECTED NOT DETECTED Final   Carbapenem resistance VIM NOT DETECTED NOT DETECTED Final    Comment: Performed at [facility], [address] [place], [place] [address]  Blood Culture (routine x 2)     Status: Abnormal   Collection Time: 06/11/23  4:08 PM   Specimen: BLOOD LEFT FOREARM  Result Value Ref Range Status   Specimen Description   Final    BLOOD LEFT FOREARM Performed at [facility], [address] [place], [place] [address]    Special Requests   Final    BOTTLES DRAWN AEROBIC AND ANAEROBIC Blood Culture adequate volume Performed at [facility], [address], [place], [place] [address]    Culture  Setup Time   Final    GRAM NEGATIVE RODS IN BOTH AEROBIC AND ANAEROBIC BOTTLES CRITICAL VALUE NOTED.  VALUE IS CONSISTENT WITH PREVIOUSLY REPORTED AND CALLED VALUE.    Culture (A)  Final    CITROBACTER FREUNDII SUSCEPTIBILITIES PERFORMED ON PREVIOUS CULTURE WITHIN THE LAST 5 DAYS. Performed at [facility], [address] [place], [place] [address]    Report Status 06/16/2023 FINAL  Final  Urine Culture     Status: Abnormal   Collection Time: 06/11/23  6:14 PM   Specimen: Urine, Random  Result Value Ref Range Status   Specimen Description   Final    URINE,  RANDOM Performed at [facility], [address], [place], [place] [address]    Special Requests   Final    NONE Reflexed from [id number] Performed at [facility], [address], [place], [place] [address]    Culture >=100,000 COLONIES/mL CITROBACTER FREUNDII (A)  Final   Report Status 06/13/2023 FINAL  Final   Organism ID, Bacteria CITROBACTER FREUNDII (A)  Final      Susceptibility   Citrobacter freundii - MIC*    CEFEPIME <=0.12 SENSITIVE Sensitive     CEFTRIAXONE <=0.25 SENSITIVE Sensitive     CIPROFLOXACIN <=0.25 SENSITIVE Sensitive     GENTAMICIN <=1 SENSITIVE Sensitive     IMIPENEM 1 SENSITIVE Sensitive     NITROFURANTOIN <=16 SENSITIVE Sensitive     TRIMETH/SULFA <=20 SENSITIVE Sensitive     PIP/TAZO <=4 SENSITIVE Sensitive     * >=100,000 COLONIES/mL CITROBACTER FREUNDII    Radiology Studies: No results found.     T.  [facility]  If 7PM-7AM, please contact night-coverage [email or web address] 06/17/2023, 11:07 AM

## 2023-06-17 NOTE — Progress Notes (Signed)
LTM EEG discontinued - no skin breakdown at unhook. [facility] notified 

## 2023-06-17 NOTE — Progress Notes (Addendum)
Occupational Therapy Treatment Patient Details Name: Aaron Hickman MRN: [id number] DOB: 1936/01/17 Today's Date: 06/17/2023   History of present illness Aaron Hickman is a 87 y.o. male presents with altered mental status.Found to have sepsis secondary to UTI and  acute metabolic encephalopathy. PHMx: medial sphenoid meningoma s/p radiation, chronic vasogenic edema (noncompliant with steroids), seizures, BPH,memory decline, confusion, and disorientation over the past few months; very HOH.   OT comments  Pt making slow progress with functional goals. Agree with PT that unless pt shows notable improvement in function or family confident they can provide 24/7 and level of assist required  with physical help for ADLs, ADL mobility, and supervision for safety, pt will need ST SNF stay for rehab to maximize level of function and safety before return home at d/c. OT will continue to follow acutely to maximize level of function and safety      If plan is discharge home, recommend the following:  A little help with walking and/or transfers;A little help with bathing/dressing/bathroom;Assistance with cooking/housework;Help with stairs or ramp for entrance;Assist for transportation;Direct supervision/assist for financial management;Direct supervision/assist for medications management   Equipment Recommendations  BSC/3in1    Recommendations for Other Services      Precautions / Restrictions Precautions Precautions: Fall Precaution Comments: can be combative Restrictions Weight Bearing Restrictions: No       Mobility Bed Mobility Overal bed mobility: Needs Assistance Bed Mobility: Supine to Sit, Sit to Supine     Supine to sit: Min assist Sit to supine: Mod assist   General bed mobility comments: min A to elevate trunk, mod A with LEs mgt onto bed, cues for sequencing required    Transfers Overall transfer level: Needs assistance Equipment used: 1 person hand held  assist Transfers: Sit to/from Stand, Bed to chair/wheelchair/BSC Sit to Stand: Mod assist Stand pivot transfers: Mod assist               Balance Overall balance assessment: Needs assistance Sitting-balance support: No upper extremity supported, Feet supported Sitting balance-Leahy Scale: Fair Sitting balance - Comments: pushing baclwards at times seated EOB Postural control: Right lateral lean, Posterior lean Standing balance support: Single extremity supported, During functional activity Standing balance-Leahy Scale: Poor                             ADL either performed or assessed with clinical judgement   ADL Overall ADL's : Needs assistance/impaired     Grooming: Wash/dry hands;Wash/dry face;Contact guard assist;Sitting Grooming Details (indicate cue type and reason): EOB         Upper Body Dressing : Contact guard assist;Sitting Upper Body Dressing Details (indicate cue type and reason): EOB     Toilet Transfer: Ambulation;Rolling walker (2 wheels);Moderate assistance;Cueing for safety;Cueing for sequencing Toilet Transfer Details (indicate cue type and reason): simulated bed to chair Toileting- Clothing Manipulation and Hygiene: Moderate assistance;Sit to/from stand       Functional mobility during ADLs: Moderate assistance;Cueing for safety;Cueing for sequencing      Extremity/Trunk Assessment Upper Extremity Assessment Upper Extremity Assessment: Generalized weakness   Lower Extremity Assessment Lower Extremity Assessment: Defer to PT evaluation        Vision Ability to See in Adequate Light: 0 Adequate Patient Visual Report: No change from baseline     Perception     Praxis      Cognition Arousal: Alert Behavior During Therapy: WFL for tasks assessed/performed Overall Cognitive Status: No  family/caregiver present to determine baseline cognitive functioning                                 General Comments: Poorly  following commands, impulsive, decreased awareness of deficits and safety.        Exercises      Shoulder Instructions       General Comments Nursing coming in to give meds prior for MRI    Pertinent Vitals/ Pain       Pain Assessment Pain Assessment: No/denies pain Pain Intervention(s): Monitored during session  Home Living                                          Prior Functioning/Environment              Frequency  Min 1X/week        Progress Toward Goals  OT Goals(current goals can now be found in the care plan section)  Progress towards OT goals: OT to reassess next treatment     Plan      Co-evaluation                 AM-PAC OT "6 Clicks" Daily Activity     Outcome Measure   Help from another person eating meals?: None Help from another person taking care of personal grooming?: A Little Help from another person toileting, which includes using toliet, bedpan, or urinal?: A Lot Help from another person bathing (including washing, rinsing, drying)?: A Lot Help from another person to put on and taking off regular upper body clothing?: A Little Help from another person to put on and taking off regular lower body clothing?: A Little 6 Click Score: 17    End of Session Equipment Utilized During Treatment: Gait belt;Rolling walker (2 wheels)  OT Visit Diagnosis: Unsteadiness on feet (R26.81);Other abnormalities of gait and mobility (R26.89);Muscle weakness (generalized) (M62.81);Other symptoms and signs involving cognitive function   Activity Tolerance Patient tolerated treatment well   Patient Left in bed;with call bell/phone within reach;with bed alarm set   Nurse Communication          Time: 7829-5621 OT Time Calculation (min): 16 min  Charges: OT General Charges $OT Visit: 1 Visit OT Treatments $Self Care/Home Management : 8-22 mins    Galen Manila 06/17/2023, 1:58 PM

## 2023-06-17 NOTE — Progress Notes (Addendum)
Subjective: No clinical seizures overnight.  Continues to be encephalopathic.  ROS: Unable to obtain due to poor mental status  Examination  Vital signs in last 24 hours: Temp:  [98.3 F (36.8 C)-98.9 F (37.2 C)] 98.7 F (37.1 C) (08/15 0300) Pulse Rate:  [74-79] 77 (08/15 0300) Resp:  [14-18] 14 (08/15 0300) BP: (129-153)/(65-87) 153/87 (08/15 0300) SpO2:  [100 %] 100 % (08/15 0300)  General: lying in bed, NAD Neuro: Opens eyes to repeated verbal stimulation, looks at examiner mumbles but incomprehensible speech, does not answer orientation question, does not follow commands, pupils appear equally round and reactive with no forced gaze deviation, does blink to threat and spontaneously moving all 4 extremities in bed  Basic Metabolic Panel: Recent Labs  Lab 06/13/23 0526 06/14/23 0543 06/15/23 1115 06/16/23 1032 06/17/23 0019  NA 144 141 138 139 137  K 3.6 3.1* 3.5 3.8 3.9  CL 113* 108 107 103 102  CO2 21* 23 21* 26 27  GLUCOSE 73 77 150* 104* 84  BUN 36* 23 18 13 9   CREATININE 1.06 0.81 0.98 0.96 1.12  CALCIUM 8.5* 8.3* 7.8* 8.1* 8.1*  MG 1.7 1.7 1.4* 2.1 1.7  PHOS 2.5 2.9 2.9 2.3* 2.1*    CBC: Recent Labs  Lab 06/11/23 1605 06/12/23 0552 06/13/23 0526 06/14/23 0543 06/15/23 1115 06/17/23 0019  WBC 28.3* 25.3* 13.1* 7.6 9.1 6.3  NEUTROABS 23.4*  --  11.2* 5.5 6.3  --   HGB 9.4* 9.1* 8.9* 9.9* 10.5* 10.8*  HCT 29.3* 28.5* 29.7* 31.2* 34.1* 34.0*  MCV 97.0 98.3 102.1* 97.2 98.3 93.9  PLT 139* 117* 118* 128* 160 198     Coagulation Studies: No results for input(s): "LABPROT", "INR" in the last 72 hours.  Imaging CT head without contrast 06/11/2023: No acute intracranial abnormality. Redemonstrated vasogenic edema in the anterior right temporal lobe, and the right insula. Known lesion abutting the right cavernous sinus is poorly visualized due to CT technique.   ASSESSMENT AND PLAN: 87 year old male with history of meningioma status post gamma knife  radiation in the past, right temporal vasogenic edema, seizures on Keppra who presented with UTI and agitation.  Was switched from Keppra to Vimpat to minimize agitation.  However continues to be encephalopathic.  Meningioma Cerebral edema Seizures Acute encephalopathy UTI -Encephalopathy likely secondary to medications, cerebral edema  Recommendations -Will DC LTM EEG as no further seizures overnight -Continue Vimpat 75 mg twice daily for now -Will obtain MRI brain with and without contrast to look for any changes in size of the lesion.  Patient has been offered gamma knife surgery in the past.  If lesion is enlarging, may need to discuss with wife and consult neurosurgery -If mental status does not improve, may consider starting dexamethasone for vasogenic edema -Continue seizure precautions -As needed IV Versed for clinical seizures -Discussed plan with Dr. Alanda Slim  I have spent a total of 36   minutes with the patient reviewing hospital notes,  test results, labs and examining the patient as well as establishing an assessment and plan  > 50% of time was spent in direct patient care.  Lindie Spruce Epilepsy [facility] For questions after 5pm please refer to AMION to reach the Neurologist on call

## 2023-06-17 NOTE — Progress Notes (Signed)
Physical Therapy Treatment Patient Details Name: Aaron Hickman MRN: [id number] DOB: 01-20-1936 Today's Date: 06/17/2023   History of Present Illness Aaron Hickman is a 87 y.o. male presents with altered mental status.Found to have sepsis secondary to UTI and  acute metabolic encephalopathy. PHMx: medial sphenoid meningoma s/p radiation, chronic vasogenic edema (noncompliant with steroids), seizures, BPH,memory decline, confusion, and disorientation over the past few months; very HOH.    PT Comments  Limited by confusion and difficulty with mobility today. Mod assist for transfer and lateral steps along bed, showing posterior LOB. Able to recall therapist name from earlier visit. Impulsive attempting to get out of bed. Poor awareness of deficits and safety requesting to walk around bed but attached to multiple limiting lines and leads this morning. Repositioned in bed comfortably at end of session with EEG tech in room to remove EEG monitor. Unless pt shows notable improvement in function or family confident they can provide 24/7 assist with physical help for OOB mobility, and supervision for safety due to impulsiveness, pt may benefit from ST SNF stay to maximize function at d/c. Will follow and update as appropriate. Patient will continue to benefit from skilled physical therapy services to further improve independence with functional mobility.     If plan is discharge home, recommend the following: Assistance with cooking/housework;Direct supervision/assist for medications management;Direct supervision/assist for financial management;Assist for transportation;Help with stairs or ramp for entrance;Supervision due to cognitive status;A lot of help with walking and/or transfers;A lot of help with bathing/dressing/bathroom   Can travel by private vehicle      (Likely soon)  Equipment Recommendations  Rolling walker (2 wheels)    Recommendations for Other Services       Precautions /  Restrictions Precautions Precautions: Fall Precaution Comments: can be combative Restrictions Weight Bearing Restrictions: No     Mobility  Bed Mobility Overal bed mobility: Needs Assistance Bed Mobility: Supine to Sit, Sit to Supine     Supine to sit: Min assist Sit to supine: Mod assist   General bed mobility comments: Min assist for sequencing LEs out of bed and for trunk support to rise. Mod assist for trunk and LEs back to supine. Cues throughout, poorly following.    Transfers Overall transfer level: Needs assistance Equipment used: 1 person hand held assist Transfers: Sit to/from Stand Sit to Stand: Mod assist           General transfer comment: Mod assist for boost and balance, leans posteriorly. Cues for technique, hand held. Performed x2, knees bracing against bed. Limited room for RW with EEG and IV equipment in room.    Ambulation/Gait Ambulation/Gait assistance: Mod assist Gait Distance (Feet): 3 Feet Assistive device: 1 person hand held assist Gait Pattern/deviations: Step-to pattern, Shuffle, Trunk flexed Gait velocity: decreased     General Gait Details: Mod assist for balance to take lateral steps and a couple of forward steps along bed to reposition. VC for sequencing, tactile cues to facilitate. Poor LE control and placement.   Stairs             Wheelchair Mobility     Tilt Bed    Modified Rankin (Stroke Patients Only)       Balance Overall balance assessment: Needs assistance Sitting-balance support: No upper extremity supported, Feet supported Sitting balance-Leahy Scale: Fair Sitting balance - Comments: CGA, intermittent min assist to correct, attempting to lay back at times Postural control: Right lateral lean Standing balance support: Single extremity supported, During functional activity  Standing balance-Leahy Scale: Poor                              Cognition Arousal: Alert Behavior During Therapy: WFL for  tasks assessed/performed Overall Cognitive Status: No family/caregiver present to determine baseline cognitive functioning                                 General Comments: Disoriented, recalls therapist name end of session, able to converse with long term recall of his past. Poorly following commands, impulsive, decreased awareness of deficits and safety.        Exercises      General Comments General comments (skin integrity, edema, etc.): EEG tech in room end of session to take equipment off pt. His HR elevated to 140 with standing at EOB.      Pertinent Vitals/Pain Pain Assessment Pain Assessment: No/denies pain Pain Intervention(s): Monitored during session    Home Living                          Prior Function            PT Goals (current goals can now be found in the care plan section) Acute Rehab PT Goals PT Goal Formulation: Patient unable to participate in goal setting Time For Goal Achievement: 06/29/23 Potential to Achieve Goals: Fair Progress towards PT goals: Progressing toward goals    Frequency    Min 1X/week      PT Plan      Co-evaluation              AM-PAC PT "6 Clicks" Mobility   Outcome Measure  Help needed turning from your back to your side while in a flat bed without using bedrails?: A Little Help needed moving from lying on your back to sitting on the side of a flat bed without using bedrails?: A Little Help needed moving to and from a bed to a chair (including a wheelchair)?: A Lot Help needed standing up from a chair using your arms (e.g., wheelchair or bedside chair)?: A Lot Help needed to walk in hospital room?: A Lot Help needed climbing 3-5 steps with a railing? : A Lot 6 Click Score: 14    End of Session Equipment Utilized During Treatment: Gait belt Activity Tolerance: Patient tolerated treatment well Patient left: in bed;with call bell/phone within reach;with bed alarm set;Other (comment) (EEG  tech in room) Nurse Communication: Mobility status PT Visit Diagnosis: Unsteadiness on feet (R26.81);Other abnormalities of gait and mobility (R26.89);Muscle weakness (generalized) (M62.81);Difficulty in walking, not elsewhere classified (R26.2)     Time: 1010-1019 PT Time Calculation (min) (ACUTE ONLY): 9 min  Charges:    $Therapeutic Activity: 8-22 mins PT General Charges $$ ACUTE PT VISIT: 1 Visit                     Kathlyn [place], PT, DPT [facility]  Rehabilitation Services Physical Therapist Office: [phone] Website: [email or web address]    Berton Mount 06/17/2023, 12:05 PM

## 2023-06-17 NOTE — Procedures (Addendum)
Patient Name: Aaron Hickman  MRN: [id number]  Epilepsy Attending: Charlsie Quest  Referring Physician/Provider: Erick Blinks, MD  Duration: 06/17/2023 0005 to 06/17/2023 1018   Patient history: 87 year old Caucasian male with past medical history significant for but limited to medial sphenoid meningioma status postradiation, chronic vasogenic edema who has been noncompliant with steroids, seizures, BPH as well as other comorbidities who presented with altered mental status. EEG to evaluate for seizure    Level of alertness: Awake, asleep   AEDs during EEG study: LCM   Technical aspects: This EEG study was done with scalp electrodes positioned according to the 10-20 International system of electrode placement. Electrical activity was reviewed with band pass filter of 1-70Hz , sensitivity of 7 uV/mm, display speed of 33mm/sec with a 60Hz  notched filter applied as appropriate. EEG data were recorded continuously and digitally stored.  Video monitoring was available and reviewed as appropriate.   Description: The posterior dominant rhythm consists of 8 Hz activity of moderate voltage (25-35 uV) seen predominantly in posterior head regions, symmetric and reactive to eye opening and eye closing. Sleep was characterized by vertex waves, sleep spindles (12 to 14 Hz), maximal frontocentral region. EEG showed intermittent 3 to 6 Hz theta-delta slowing in right temporo-parietal region. Hyperventilation and photic stimulation were not performed.      ABNORMALITY - Intermittent slow, right temporo-parietal region.    IMPRESSION: This study was suggestive of cortical dysfunction arising from right temporo-parietal region, likely secondary to underlying structural abnormality. No seizures were noted during this study.     Annabelle Harman

## 2023-06-17 NOTE — Significant Event (Signed)
Rapid Response Event Note   Reason for Call :  Hypotension(55/38), unresponsiveness  Initial Focused Assessment:  Pt lying in bed with eyes closed. He is unresponsive to painful stimulation however is resistant to eye opening and has a gag reflex. His pupils are 2, equal, and sluggish. He has shallow respirations and a weak pulse. Lungs are clear t/o. Skin is cool/clammy.  T-98.1, HR-76, BP-55/38, RR-15, SpO2-97% on RA  Pt received 1mg  ativan IV at 1332 prior to MRI. Per RN, pt was alert and oriented x 1 prior to MRI.   Interventions:  500cc NS bolus-SBP up to 100s NS@ 50cc/hr x 1 day CBG-111 CBC/CMP/LA STAT Plan of Care:  SBP up to 100s. Pt now opens eyes, answers some questions, able to follow commands, and move all extremities. Finish bolus, start MIVF, and await lab results. Please call RRT if further assistance needed.   Event Summary:   MD Notified: Dr. Janalyn Shy notified and came to bedside.  Call Time:1908 Arrival Time:1911 End WFUX:3235  Terrilyn Saver, RN

## 2023-06-18 ENCOUNTER — Other Ambulatory Visit (HOSPITAL_COMMUNITY): Payer: Self-pay

## 2023-06-18 DIAGNOSIS — Z7189 Other specified counseling: Secondary | ICD-10-CM

## 2023-06-18 DIAGNOSIS — R569 Unspecified convulsions: Secondary | ICD-10-CM | POA: Diagnosis not present

## 2023-06-18 DIAGNOSIS — D329 Benign neoplasm of meninges, unspecified: Secondary | ICD-10-CM | POA: Diagnosis not present

## 2023-06-18 DIAGNOSIS — N390 Urinary tract infection, site not specified: Secondary | ICD-10-CM | POA: Diagnosis not present

## 2023-06-18 DIAGNOSIS — Z515 Encounter for palliative care: Secondary | ICD-10-CM | POA: Diagnosis not present

## 2023-06-18 DIAGNOSIS — A419 Sepsis, unspecified organism: Secondary | ICD-10-CM | POA: Diagnosis not present

## 2023-06-18 DIAGNOSIS — Z66 Do not resuscitate: Secondary | ICD-10-CM

## 2023-06-18 DIAGNOSIS — N1330 Unspecified hydronephrosis: Secondary | ICD-10-CM | POA: Diagnosis not present

## 2023-06-18 DIAGNOSIS — R4182 Altered mental status, unspecified: Secondary | ICD-10-CM | POA: Diagnosis not present

## 2023-06-18 DIAGNOSIS — E872 Acidosis, unspecified: Secondary | ICD-10-CM | POA: Diagnosis not present

## 2023-06-18 LAB — COMPREHENSIVE METABOLIC PANEL
ALT: 17 U/L (ref 0–44)
AST: 19 U/L (ref 15–41)
Albumin: 2.6 g/dL — ABNORMAL LOW (ref 3.5–5.0)
Alkaline Phosphatase: 49 U/L (ref 38–126)
Anion gap: 10 (ref 5–15)
BUN: 12 mg/dL (ref 8–23)
CO2: 24 mmol/L (ref 22–32)
Calcium: 8.1 mg/dL — ABNORMAL LOW (ref 8.9–10.3)
Chloride: 103 mmol/L (ref 98–111)
Creatinine, Ser: 1.02 mg/dL (ref 0.61–1.24)
GFR, Estimated: >60 mL/min (ref >60)
Glucose, Bld: 91 mg/dL (ref 70–99)
Potassium: 4.2 mmol/L (ref 3.5–5.1)
Sodium: 137 mmol/L (ref 135–145)
Total Bilirubin: 0.7 mg/dL (ref 0.3–1.2)
Total Protein: 5.4 g/dL — ABNORMAL LOW (ref 6.5–8.1)

## 2023-06-18 LAB — CBC
HCT: 33.4 % — ABNORMAL LOW (ref 39.0–52.0)
Hemoglobin: 10.8 g/dL — ABNORMAL LOW (ref 13.0–17.0)
MCH: 30.1 pg (ref 26.0–34.0)
MCHC: 32.3 g/dL (ref 30.0–36.0)
MCV: 93 fL (ref 80.0–100.0)
Platelets: 224 10*3/uL (ref 150–400)
RBC: 3.59 MIL/uL — ABNORMAL LOW (ref 4.22–5.81)
RDW: 13.3 % (ref 11.5–15.5)
WBC: 4.7 10*3/uL (ref 4.0–10.5)
nRBC: 0 % (ref 0.0–0.2)

## 2023-06-18 LAB — GLUCOSE, CAPILLARY: Glucose-Capillary: 118 mg/dL — ABNORMAL HIGH (ref 70–99)

## 2023-06-18 LAB — CORTISOL: Cortisol, Plasma: 15 ug/dL

## 2023-06-18 LAB — TSH: TSH: 0.536 u[IU]/mL (ref 0.350–4.500)

## 2023-06-18 LAB — LACTIC ACID, PLASMA: Lactic Acid, Venous: 1 mmol/L (ref 0.5–1.9)

## 2023-06-18 MED ORDER — SODIUM CHLORIDE 0.9 % IV BOLUS
1000.0000 mL | Freq: Once | INTRAVENOUS | Status: AC
  Administered 2023-06-18: 1000 mL via INTRAVENOUS

## 2023-06-18 MED ORDER — HALOPERIDOL LACTATE 5 MG/ML IJ SOLN
1.0000 mg | Freq: Four times a day (QID) | INTRAMUSCULAR | Status: DC | PRN
  Administered 2023-06-18 – 2023-06-20 (×2): 1 mg via INTRAVENOUS
  Filled 2023-06-18: qty 1

## 2023-06-18 MED ORDER — SODIUM CHLORIDE 0.9 % IV BOLUS: 500.0000 mL | Freq: Once | INTRAVENOUS | Status: DC

## 2023-06-18 MED ORDER — SODIUM CHLORIDE 0.9 % IV BOLUS: 500.0000 mL | INTRAVENOUS | Status: DC

## 2023-06-18 MED ORDER — DEXAMETHASONE SODIUM PHOSPHATE 4 MG/ML IJ SOLN
4.0000 mg | Freq: Two times a day (BID) | INTRAMUSCULAR | Status: DC
  Administered 2023-06-18 – 2023-06-21 (×7): 4 mg via INTRAVENOUS
  Filled 2023-06-18 (×7): qty 1

## 2023-06-18 MED ORDER — SODIUM CHLORIDE 0.9 % IV SOLN: INTRAVENOUS | Status: DC

## 2023-06-18 NOTE — Consult Note (Signed)
Palliative Medicine Inpatient Consult Note  Consulting Provider: Almon Hercules, MD   Reason for consult:   Palliative Care Consult Services Palliative Medicine Consult  Reason for Consult? Goal of care discussion   06/18/2023  HPI:  Per intake H&P --> 87 year old M with PMH of seizure disorder, meningioma, chronic vasogenic edema, BPH and noncompliance with medication presenting with altered mental status and decreased p.o. intake, and admitted for sepsis due to urinary tract infection, acute metabolic encephalopathy with agitation and AKI.   Palliative care asked to get involved to further support goals of care conversations.   Clinical Assessment/Goals of Care:  *Please note that this is a verbal dictation therefore any spelling or grammatical errors are due to the "Dragon Medical One" system interpretation.  I have reviewed medical records including EPIC notes, labs and imaging, received report from bedside RN, assessed the patient who is resting comfortably.    I called patients spouse, Aaron Hickman to further discuss diagnosis prognosis, GOC, EOL wishes, disposition and options.   I introduced Palliative Medicine as specialized medical care for people living with serious illness. It focuses on providing relief from the symptoms and stress of a serious illness. The goal is to improve quality of life for both the patient and the family.  Medical History Review and Understanding:  A review of patients past medical history of seizures, meningioma, BPH, anemia, seizures, and hypotension.    Social History:  Aaron Hickman is from [place], [place]. He has been married to his wife for over 50 years. They share two children and three grandchildren. He formerly worked for the [employer]. He is a man of faith and practices within the [employer] denomination.   Functional and Nutritional State:  Preceding hospitalization wife shares he was able to mobilize, shower, dress, and feed self. She  expresses that his appetite was alright.   Advance Directives:  A detailed discussion was had today regarding advanced directives.  Patient has no living will.   Code Status:  Concepts specific to code status, artifical feeding and hydration, continued IV antibiotics and rehospitalization was had.  The difference between a aggressive medical intervention path  and a palliative comfort care path for this patient at this time was had.   Encouraged patient/family to consider DNR/DNI status understanding evidenced based poor outcomes in similar hospitalized patient, as the cause of arrest is likely associated with advanced chronic/terminal illness rather than an easily reversible acute cardio-pulmonary event. I explained that DNR/DNI does not change the medical plan and it only comes into effect after a person has arrested (died).  It is a protective measure to keep [place] from harming the patient in their last moments of life.   Aaron Hickman would like to consider this further. She shares that she knows her husband would never desire long term ventilation.   Discussion:  I shared my concerns in the setting Aaron Hickman's declining condition since last hospitalization in May. We reviewed best case and worst case scenarios.   Discussed patients present illness d/t UTI.   I shared the importance of additional conversations as a family if patients condition continues to deteriorate.   Discussed the importance of continued conversation with family and their  medical providers regarding overall plan of care and treatment options, ensuring decisions are within the context of the patients values and GOCs.  Decision Maker: Aaron Hickman, Aaron Hickman (Spouse): [phone] (Home Phone)   SUMMARY OF RECOMMENDATIONS   Full Code - Provided Hard Choices Booklet for review  Family meeting  Sunday at 1400  Ongoing PMT support  Code Status/Advance Care Planning: FULL CODE  Palliative Prophylaxis:  Aspiration, Bowel Regimen,  Delirium Protocol, Frequent Pain Assessment, Oral Care, Palliative Wound Care, and Turn Reposition  Additional Recommendations (Limitations, Scope, Preferences): Continue current care  Psycho-social/Spiritual:  Desire for further Chaplaincy support: Yes Additional Recommendations: Education on frailty   Prognosis: Increased 12 month mortality risk  Discharge Planning: Discharge likely to home, will recommend OP Palliative support.  Vitals:   06/18/23 1152 06/18/23 1201  BP: (!) 86/49 (!) 109/50  Pulse: 80 75  Resp: 12 14  Temp: (!) 97.5 F (36.4 C)   SpO2: 95% 96%    Intake/Output Summary (Last 24 hours) at 06/18/2023 1432 Last data filed at 06/18/2023 1100 Gross per 24 hour  Intake 1192.98 ml  Output 900 ml  Net 292.98 ml   Last Weight  Most recent update: 06/12/2023  9:11 AM    Weight  69.9 kg (154 lb 1.6 oz)            Gen: Elderly Caucasian M in NAD HEENT: moist mucous membranes CV: Regular rate and rhythm  PULM: On RA, breathing is even and nonlabored ABD: soft/nontender  EXT: No edema  Neuro: Disoriented  PPS: 40%   This conversation/these recommendations were discussed with patient primary care team, Dr. Alanda Slim  Billing based on MDM: High  Problems Addressed: One acute or chronic illness or injury that poses a threat to life or bodily function  Amount and/or Complexity of Data: Category 3:Discussion of management or test interpretation with external physician/other qualified health care professional/appropriate source (not separately reported)  Risks: Decision not to resuscitate or to de-escalate care because of poor prognosis ______________________________________________________ [name] Palliative Medicine Team Team Cell Phone: [phone] Please utilize secure chat with additional questions, if there is no response within 30 minutes please call the above phone number  Palliative Medicine Team providers are available by phone  from 7am to 7pm daily and can be reached through the team cell phone.  Should this patient require assistance outside of these hours, please call the patient's attending physician.

## 2023-06-18 NOTE — TOC Progression Note (Signed)
Transition of Care [facility]) - Progression Note    Patient Details  Name: UTHMAN PORCHER MRN: [id number] Date of Birth: 07-12-1936  Transition of Care [facility] CM/SW Contact  Kermit Balo, RN Phone Number: 06/18/2023, 2:14 PM  Clinical Narrative:    CM went over the recommendations with spouse for SNF rehab. She continues to state she wants him to d/c home with home health therapies.  TOC following.   Expected Discharge Plan: Skilled Nursing Facility Barriers to Discharge: Continued Medical Work up  Expected Discharge Plan and Services   Discharge Planning Services: CM Consult Post Acute Care Choice: Home Health Living arrangements for the past 2 months: Single Family Home                           HH Arranged: RN, PT, OT, Nurse's Aide, Social Work [employer] Agency: [facility] Date [facility] Agency Contacted: 06/16/23   Representative spoke with at [facility] Agency: Kandee Keen   Social Determinants of Health (SDOH) Interventions SDOH Screenings   Food Insecurity: No Food Insecurity (06/17/2023)  Housing: Low Risk  (06/17/2023)  Transportation Needs: No Transportation Needs (06/17/2023)  Utilities: Not At Risk (06/17/2023)  Tobacco Use: Medium Risk (05/21/2023)    Readmission Risk Interventions    06/14/2023   11:42 AM  Readmission Risk Prevention Plan  Transportation Screening Complete  PCP or Specialist Appt within 3-5 Days Complete  HRI or Home Care Consult Complete  Social Work Consult for Recovery Care Planning/Counseling Complete  Palliative Care Screening Not Applicable  Medication Review Oceanographer) Complete

## 2023-06-18 NOTE — Progress Notes (Signed)
Patient bedside nurse reported that blood pressure is low 87/62 MAP 71.  Heart rate 98.  Per chart review throughout the hospital course patient developed multiple recurrent episodes of hypotension.  Currently not any medication which can contribute to hypotension.  Pending echocardiogram.   Earlier today she received NS 1 L bolus.  Currently on NS with Vimpat 75 cc/h. - Giving NS 500 cc bolus now - Continue Vimpat NS 75 cc/h.   Tereasa Coop, MD [facility] 06/18/2023, 10:16 PM

## 2023-06-18 NOTE — TOC Benefit Eligibility Note (Deleted)
Pharmacy Patient Advocate Encounter  Insurance verification completed.    The patient is insured through [employer]/ [employer]. Patient has Medicare and is not eligible for a copay card, but may be able to apply for patient assistance, if available.    Ran test claim for Valtoco and the current 30 day co-pay is $220.62.  Ran test claim for Lacosamide and the current 30 day co-pay is $37.68   This test claim was processed through [facility]- copay amounts may vary at other pharmacies due to [employer], or as the patient moves through the different stages of their insurance plan.

## 2023-06-18 NOTE — Plan of Care (Signed)
  Problem: Skin Integrity: Goal: Risk for impaired skin integrity will decrease Outcome: Not Progressing   Problem: Safety: Goal: Ability to remain free from injury will improve Outcome: Not Progressing   Problem: Clinical Measurements: Goal: Cardiovascular complication will be avoided Outcome: Not Progressing

## 2023-06-18 NOTE — TOC Benefit Eligibility Note (Signed)
Pharmacy Patient Advocate Encounter  Insurance verification completed.    The patient is insured through [employer]/ [employer]. Patient has Medicare and is not eligible for a copay card, but may be able to apply for patient assistance, if available.    Ran test claim for Valtoco and the current 30 day co-pay is $220.62.  Ran test claim for Lacosamide and the current 30 day co-pay is $37.68   This test claim was processed through [facility]- copay amounts may vary at other pharmacies due to [employer], or as the patient moves through the different stages of their insurance plan.

## 2023-06-18 NOTE — Evaluation (Signed)
Clinical/Bedside Swallow Evaluation Patient Details  Name: Aaron Hickman MRN: [id number] Date of Birth: [birth year]  Today's Date: 06/18/2023 Time: SLP Start Time (ACUTE ONLY): 0956 SLP Stop Time (ACUTE ONLY): 1015 SLP Time Calculation (min) (ACUTE ONLY): 19 min  Past Medical History:  Past Medical History:  Diagnosis Date   Blood clots in brain    Tumors    in head   Past Surgical History: No past surgical history on file. HPI:  Aaron Hickman is an 87 yo male presenting to ED 8/9 with AMS. Found to have sepsis secondary to UTI. CXR with hypoinflation with minimal bibasilar subsegmental atelectasis. MRI Brain with similar vs mildly increased size of previously treated R middle cranial fossa meningioma, similar R temporoparietal meningioma, and redemonstrated R MCA bifurcation aneurysm. Seen by SLP May 2024 with an overall functional swallow with prolonged mastication and was recommended a mechanical soft diet with thin liquids. He was promptly upgraded to regular solids with thin liquids with improved mentation. PMH includes medial sphenoid meningoma s/p radiation, chronic vasogenic edema (noncompliant with steroids), seizures, BPH, memory decline, confusion, and disorientation over the past few months; very HOH    Assessment / Plan / Recommendation  Clinical Impression  Pt mildly confused throughout session, oriented to self when given multiple choices and intermittently able to follow commands. Full oral motor exam unable to be conducted due to pt's difficutly following commands, although overall appears WFL. Pt able to self-feed trials of thin liquids via straw, purees, and solids with no clinical signs of dysphagia or aspiration. Suspect pt will benefit from full supervision to assist with feeding. Recommend diet of regular textures with thin liquids. No further SLP f/u is necessary for swallowing. SLP Visit Diagnosis: Dysphagia, unspecified (R13.10)    Aspiration Risk  Mild  aspiration risk    Diet Recommendation Regular;Thin liquid    Liquid Administration via: Cup;Straw Medication Administration: Whole meds with liquid Supervision: Staff to assist with self feeding;Full supervision/cueing for compensatory strategies Compensations: Minimize environmental distractions;Slow rate;Small sips/bites Postural Changes: Seated upright at 90 degrees;Remain upright for at least 30 minutes after po intake    Other  Recommendations Oral Care Recommendations: Oral care BID    Recommendations for follow up therapy are one component of a multi-disciplinary discharge planning process, led by the attending physician.  Recommendations may be updated based on patient status, additional functional criteria and insurance authorization.  Follow up Recommendations No SLP follow up      Assistance Recommended at Discharge    Functional Status Assessment Patient has not had a recent decline in their functional status  Frequency and Duration            Prognosis Prognosis for improved oropharyngeal function: Good Barriers to Reach Goals: Cognitive deficits      Swallow Study   General HPI: Aaron Hickman is an 87 yo male presenting to ED 8/9 with AMS. Found to have sepsis secondary to UTI. CXR with hypoinflation with minimal bibasilar subsegmental atelectasis. MRI Brain with similar vs mildly increased size of previously treated R middle cranial fossa meningioma, similar R temporoparietal meningioma, and redemonstrated R MCA bifurcation aneurysm. Seen by SLP May 2024 with an overall functional swallow with prolonged mastication and was recommended a mechanical soft diet with thin liquids. He was promptly upgraded to regular solids with thin liquids with improved mentation. PMH includes medial sphenoid meningoma s/p radiation, chronic vasogenic edema (noncompliant with steroids), seizures, BPH, memory decline, confusion, and disorientation over the past few  months; very HOH Type  of Study: Bedside Swallow Evaluation Previous Swallow Assessment: see HPI Diet Prior to this Study: Regular;Thin liquids (Level 0) Temperature Spikes Noted: Yes Respiratory Status: Room air History of Recent Intubation: No Behavior/Cognition: Alert;Cooperative;Pleasant mood;Requires cueing Oral Cavity Assessment: Within Functional Limits Oral Care Completed by SLP: No Oral Cavity - Dentition: Adequate natural dentition Vision: Functional for self-feeding Self-Feeding Abilities: Needs assist Patient Positioning: Upright in bed Baseline Vocal Quality: Normal Volitional Cough: Cognitively unable to elicit Volitional Swallow: Unable to elicit    Oral/Motor/Sensory Function Overall Oral Motor/Sensory Function: Within functional limits   Ice Chips Ice chips: Not tested   Thin Liquid Thin Liquid: Within functional limits Presentation: Straw;Self Fed    Nectar Thick Nectar Thick Liquid: Not tested   Honey Thick Honey Thick Liquid: Not tested   Puree Puree: Within functional limits Presentation: Spoon;Self Fed   Solid     Solid: Within functional limits Presentation: Self Fed      Gwynneth Aliment, M.A., CF-SLP Speech Language Pathology, Acute Rehabilitation Services  Secure Chat preferred [phone]  06/18/2023,10:26 AM

## 2023-06-18 NOTE — Progress Notes (Addendum)
Subjective: had rapid response called overnight for hypotension, improved with IVF bolus.   ROS: Unable to obtain due to poor mental status  Examination  Vital signs in last 24 hours: Temp:  [97.5 F (36.4 C)-98.4 F (36.9 C)] 97.7 F (36.5 C) (08/16 0735) Pulse Rate:  [65-89] 87 (08/16 0735) Resp:  [12-20] 16 (08/16 0735) BP: (52-142)/(38-130) 124/60 (08/16 0735) SpO2:  [96 %-100 %] 96 % (08/16 0735)  General: sitting in bed, NAD Neuro: MS: Alert, but didn't answer any orientation questions and didn't follow commands CN: pupils equal and reactive,  EOMI, face symmetric, tongue midline, normal sensation over face, Motor: antigravity strength in all extremities  Basic Metabolic Panel: Recent Labs  Lab 06/13/23 0526 06/14/23 0543 06/15/23 1115 06/16/23 1032 06/17/23 0019 06/17/23 2008 06/18/23 0419  NA 144 141 138 139 137 137 137  K 3.6 3.1* 3.5 3.8 3.9 4.1 4.2  CL 113* 108 107 103 102 103 103  CO2 21* 23 21* 26 27 19* 24  GLUCOSE 73 77 150* 104* 84 97 91  BUN 36* 23 18 13 9 11 12   CREATININE 1.06 0.81 0.98 0.96 1.12 1.10 1.02  CALCIUM 8.5* 8.3* 7.8* 8.1* 8.1* 7.9* 8.1*  MG 1.7 1.7 1.4* 2.1 1.7  --   --   PHOS 2.5 2.9 2.9 2.3* 2.1*  --   --     CBC: Recent Labs  Lab 06/11/23 1605 06/12/23 0552 06/13/23 0526 06/14/23 0543 06/15/23 1115 06/17/23 0019 06/17/23 2008 06/18/23 0419  WBC 28.3*   < > 13.1* 7.6 9.1 6.3 5.9 4.7  NEUTROABS 23.4*  --  11.2* 5.5 6.3  --   --   --   HGB 9.4*   < > 8.9* 9.9* 10.5* 10.8* 11.1* 10.8*  HCT 29.3*   < > 29.7* 31.2* 34.1* 34.0* 35.8* 33.4*  MCV 97.0   < > 102.1* 97.2 98.3 93.9 96.0 93.0  PLT 139*   < > 118* 128* 160 198 193 224   < > = values in this interval not displayed.     Coagulation Studies: No results for input(s): "LABPROT", "INR" in the last 72 hours.  Imaging personally reviewed  MR Brain w and wo contrast 06/17/2023: Similar versus mildly increased size of the previously treated right middle cranial fossa  meningioma with similar adjacent subcentimeter nodular focus of enhancement. Similar exuberant surrounding edema. Similar putative 1.2 cm right temporoparietal meningioma.  Redemonstrated right MCA bifurcation aneurysm, not well assessed on this study.    ASSESSMENT AND PLAN:  87 year old male with history of meningioma status post gamma knife radiation in the past, right temporal vasogenic edema, seizures on Keppra who presented with UTI and agitation.  Was switched from Keppra to Vimpat to minimize agitation.  However continues to be encephalopathic.   Meningioma Cerebral edema Seizures Acute encephalopathy UTI -Encephalopathy likely secondary to medications, cerebral edema   Recommendations -Continue Vimpat 75 mg twice daily for now. Can switch to PO at same dose once able to take PO - Rescue med: intranasal valtoco 15mg  for seizure lasting over 2 minutes. - Requested pharmacy to check with insurance if vimpat and valtoco are covered -MRI brain with and without contrast stable with slightly worsening edema Patient has been offered gamma knife surgery in the past.  May need to discuss with wife and f/u with his neurosurgeon -If mental status does not improve, can consider starting dexamethasone 4mg  q8h for vasogenic edema  - Triec to call wife, goes straight to VM. Called  daughter as well and left voice mail -Continue seizure precautions -As needed IV Versed for clinical seizures - F/u with neuro Dr Karel Jarvis scheduled for 9/23 at 1400 - Neurology will sign off. Please call us for any questions - Discussed with Dr Alanda Slim via secure chat  Seizure precautions: Per [facility] statutes, patients with seizures are not allowed to drive until they have been seizure-free for six months and cleared by a physician    Use caution when using heavy equipment or power tools. Avoid working on ladders or at heights. Take showers instead of baths. Ensure the water temperature is not too high on  the home water heater. Do not go swimming alone. Do not lock yourself in a room alone (i.e. bathroom). When caring for infants or small children, sit down when holding, feeding, or changing them to minimize risk of injury to the child in the event you have a seizure. Maintain good sleep hygiene. Avoid alcohol.    If patient has another seizure, call 911 and bring them back to the ED if: A.  The seizure lasts longer than 5 minutes.      B.  The patient doesn't wake shortly after the seizure or has new problems such as difficulty seeing, speaking or moving following the seizure C.  The patient was injured during the seizure D.  The patient has a temperature over 102 F (39C) E.  The patient vomited during the seizure and now is having trouble breathing    During the Seizure   - First, ensure adequate ventilation and place patients on the floor on their left side  Loosen clothing around the neck and ensure the airway is patent. If the patient is clenching the teeth, do not force the mouth open with any object as this can cause severe damage - Remove all items from the surrounding that can be hazardous. The patient may be oblivious to what's happening and may not even know what he or she is doing. If the patient is confused and wandering, either gently guide him/her away and block access to outside areas - Reassure the individual and be comforting - Call 911. In most cases, the seizure ends before EMS arrives. However, there are cases when seizures may last over 3 to 5 minutes. Or the individual may have developed breathing difficulties or severe injuries. If a pregnant patient or a person with diabetes develops a seizure, it is prudent to call an ambulance. - Finally, if the patient does not regain full consciousness, then call EMS. Most patients will remain confused for about 45 to 90 minutes after a seizure, so you must use judgment in calling for help.   After the Seizure (Postictal Stage)    After a seizure, most patients experience confusion, fatigue, muscle pain and/or a headache. Thus, one should permit the individual to sleep. For the next few days, reassurance is essential. Being calm and helping reorient the person is also of importance.   Most seizures are painless and end spontaneously. Seizures are not harmful to others but can lead to complications such as stress on the lungs, brain and the heart. Individuals with prior lung problems may develop labored breathing and respiratory distress.     I have spent a total of 36 minutes with the patient reviewing hospital notes,  test results, labs and examining the patient as well as establishing an assessment and plan  > 50% of time was spent in direct patient care.   Lindie Spruce Epilepsy  [facility] For questions after 5pm please refer to AMION to reach the Neurologist on call

## 2023-06-18 NOTE — NC FL2 (Signed)
[place] MEDICAID FL2 LEVEL OF CARE FORM     IDENTIFICATION  Patient Name: Aaron Hickman Birthdate: 01-Oct-1936 Sex: male Admission Date (Current Location): 06/11/2023  [facility] and [place] Number:  Producer, television/film/video and Address:  [facility] [place], [address], [place], [place] [address]      Provider Number: [id number]  Attending Physician Name and Address:  Almon Hercules, MD  Relative Name and Phone Number:       Current Level of Care: Hospital Recommended Level of Care: Skilled Nursing Facility Prior Approval Number:    Date Approved/Denied:   PASRR Number: [id number]  Discharge Plan: SNF    Current Diagnoses: Patient Active Problem List   Diagnosis Date Noted   Sepsis secondary to UTI (HCC) 06/11/2023   Metabolic acidosis, normal anion gap (NAG) 06/11/2023   Acute renal failure (ARF) (HCC) 06/11/2023   Bilateral hydronephrosis 06/11/2023   Acute metabolic encephalopathy 06/11/2023   Protein-calorie malnutrition, severe 04/02/2023   Hyponatremia 03/30/2023   Meningioma (HCC) 03/30/2023   Seizure (HCC) 03/30/2023    Orientation RESPIRATION BLADDER Height & Weight     Self  Normal Continent Weight: 154 lb 1.6 oz (69.9 kg) Height:  5\' 10"  (177.8 cm)  BEHAVIORAL SYMPTOMS/MOOD NEUROLOGICAL BOWEL NUTRITION STATUS      Continent Diet (see DC summary)  AMBULATORY STATUS COMMUNICATION OF NEEDS Skin   Extensive Assist Verbally Normal                       Personal Care Assistance Level of Assistance  Bathing, Feeding, Dressing Bathing Assistance: Limited assistance Feeding assistance: Limited assistance Dressing Assistance: Limited assistance     Functional Limitations Info  Hearing   Hearing Info: Impaired (hard of hearing)      SPECIAL CARE FACTORS FREQUENCY  PT (By licensed PT), OT (By licensed OT)     PT Frequency: 5x/wk OT Frequency: 5x/wk            Contractures Contractures Info: Not present     Additional Factors Info  Code Status, Allergies, Psychotropic Code Status Info: Full Allergies Info: Iodinated Contrast Media, Tamsulosin, Valproic Acid And Related Psychotropic Info: Zyprexa 2.5mg  daily; zyprexa 5mg  daily at bed         Current Medications (06/18/2023):  This is the current hospital active medication list Current Facility-Administered Medications  Medication Dose Route Frequency Provider Last Rate Last Admin   0.9 %  sodium chloride infusion   Intravenous Continuous Candelaria Stagers T, MD 75 mL/hr at 06/18/23 1309 Rate Change at 06/18/23 1309   Chlorhexidine Gluconate Cloth 2 % PADS 6 each  6 each Topical Daily Marguerita Merles [place], DO   6 each at 06/18/23 1016   dexamethasone (DECADRON) injection 4 mg  4 mg Intravenous Q12H Candelaria Stagers T, MD   4 mg at 06/18/23 1320   enoxaparin (LOVENOX) injection 40 mg  40 mg Subcutaneous Q24H Cherylin Mylar, RPH   40 mg at 06/18/23 1016   Gerhardt's butt cream   Topical BID Candelaria Stagers T, MD   Given at 06/18/23 1016   haloperidol lactate (HALDOL) injection 1 mg  1 mg Intravenous Q6H PRN Sundil, Subrina, MD   1 mg at 06/18/23 0526   lacosamide (VIMPAT) 75 mg in sodium chloride 0.9 % 25 mL IVPB  75 mg Intravenous Q12H Sundil, Subrina, MD 65 mL/hr at 06/18/23 1015 75 mg at 06/18/23 1015   levothyroxine (SYNTHROID) tablet 50 mcg  50 mcg  Oral Q0600 Tu, Ching T, DO   50 mcg at 06/18/23 0526   melatonin tablet 3 mg  3 mg Oral QHS Erick Blinks, MD   3 mg at 06/16/23 2139   midazolam (VERSED) injection 2 mg  2 mg Intravenous Q4H PRN Charlsie Quest, MD       multivitamin with minerals tablet 1 tablet  1 tablet Oral Daily Marguerita Merles [place], DO   1 tablet at 06/17/23 0918   OLANZapine zydis (ZYPREXA) disintegrating tablet 2.5 mg  2.5 mg Oral Daily Maryagnes Amos, FNP   2.5 mg at 06/17/23 0921   OLANZapine zydis (ZYPREXA) disintegrating tablet 5 mg  5 mg Oral QHS Maryagnes Amos, FNP   5 mg at 06/16/23 2139   potassium  PHOSPHATE 15 mmol in dextrose 5 % 250 mL infusion  15 mmol Intravenous Once Candelaria Stagers T, MD   Stopped at 06/17/23 2349   sodium chloride tablet 1 g  1 g Oral BID WC Tu, Ching T, DO   1 g at 06/17/23 1732   tamsulosin (FLOMAX) capsule 0.4 mg  0.4 mg Oral QPC supper Candelaria Stagers T, MD   0.4 mg at 06/17/23 1732     Discharge Medications: Please see discharge summary for a list of discharge medications.  Relevant Imaging Results:  Relevant Lab Results:   Additional Information SS#: [id number]  Baldemar Lenis, LCSW

## 2023-06-18 NOTE — IPAL (Signed)
  Interdisciplinary Goals of Care Family Meeting   Date carried out: 06/18/2023  Location of the meeting: Bedside  Member's involved: Physician and Family Member or next of kin (wife, daughter, son and son-in-law)  Durable Power of Pensions consultant or Environmental health practitioner: Wife    Discussion: Went by patient's room and met patient's wife, daughter, son and son-in-law. Updated on his current status and plan. Also brought up about code status to follow up on my discussion with daughter earlier today. They have decided to change code status to DNR. I have broached on options of next avenue after he lives the hospital including home with home hospice, home with therapy or SNF. Family has list of SNF they were given by TOC earlier. They also have a plan to meet with palliative on Sunday.   Code status:   Code Status: DNR   Disposition: Continue current acute care  Time spent for the meeting: 30 minutes    Almon Hercules, MD  06/18/2023, 5:36 PM

## 2023-06-18 NOTE — Progress Notes (Signed)
PROGRESS NOTE  Aaron Hickman NGE:[id number] DOB: 1936-01-25   PCP: [facility]  Patient is from: Home.  Lives with his wife.  Significant decline since his hospitalization in 03/2023  DOA: 06/11/2023 LOS: 7  Chief complaints Chief Complaint  Patient presents with   Altered Mental Status     Brief Narrative / Interim history: 87 year old M with PMH of seizure disorder, meningioma, chronic vasogenic edema, BPH and noncompliance with medication presenting with altered mental status and decreased p.o. intake, and admitted for sepsis due to urinary tract infection, acute metabolic encephalopathy with agitation and AKI.  He was febrile 201 and tachycardic.  Had leukocytosis to 28.3.  UA concerning for UTI.  Cultures obtained.  Started on IV ceftriaxone and IV fluid.  Later, urine and blood culture with pansensitive Citrobacter fruendii.  Initially, antibiotic escalated to cefepime and then de-escalated to Bactrim.  Patient has some behavioral issues and agitation for which she was started on Haldol and Zyprexa.  Psychiatry consulted and adjusted his Zyprexa and recommended neurology consultation due to concern about his Keppra contributing to behavioral disturbance.  Neurology recommended transfer to [facility] and changed his antiepileptics and started continuous EEG.  Neurology changed Keppra to Depakote and then to Vimpat due to elevated ammonia with Depakote.  Continuous EEG with subclinical seizure on 8/14 and no seizure on 8/15. EEG discontinued.  MRI brain with without contrast showed mildly increased size of previously treated right middle cranial fossa meningioma, similar exuberant surrounding edema, similar putative 1.2 cm right temporoparietal meningioma and similar right MCA bifurcation aneurysm.  Neurology recommended continuing Vimpat, seizure precaution and outpatient follow-up with neurology and neurosurgery.   Patient remains confused with incomprehensible speech  and difficulty following commands.  8/16-recurrent hypotension that improved with bolus fluid.  Consulted palliative care   Subjective:   Objective: Vitals:   06/18/23 0735 06/18/23 1138 06/18/23 1152 06/18/23 1201  BP: 124/60 (!) 65/42 (!) 86/49 (!) (P) 109/50  Pulse: 87 81 80 (P) 75  Resp: 16 12 12    Temp: 97.7 F (36.5 C) (!) 97.5 F (36.4 C) (!) 97.5 F (36.4 C)   TempSrc: Axillary Axillary Oral   SpO2: 96% 100% 95%   Weight:      Height:        Examination:  GENERAL: No apparent distress.  Nontoxic. HEENT: MMM.  Vision and hearing grossly intact.  NECK: Supple.  No apparent JVD.  RESP:  No IWOB.  Fair aeration bilaterally. CVS:  RRR. Heart sounds normal.  ABD/GI/GU: BS+. Abd soft, NTND.  MSK/EXT:  Moves extremities. No apparent deformity. No edema.  SKIN: no apparent skin lesion or wound NEURO: Sleepy but wakes to voice.  Follows some commands.  Incomprehensible speech. No apparent focal neurodeficit. PSYCH: Calm.  No agitation.  Procedures:  None  Microbiology summarized: COVID-19, influenza and RSV PCR nonreactive Blood and urine culture with pansensitive Citrobacter freundii  Assessment and plan: Principal Problem:   Sepsis secondary to UTI [facility]) Active Problems:   Meningioma (HCC)   Seizure (HCC)   Metabolic acidosis, normal anion gap (NAG)   Acute renal failure (ARF) (HCC)   Bilateral hydronephrosis   Acute metabolic encephalopathy  Severe sepsis due to Citrobacter freundii UTI and bacteremia: POA.  Has fever, leukocytosis, tachycardia with AKI and mental status change on presentation.  Culture data as above.   -Ceftriaxone >> cefepime >> Bactrim for a total of 7 days. -Since Foley was inserted on admission.  Start Flomax.  Voiding trial  when able to get out of the bed.  Acute metabolic encephalopathy/agitation: Multifactorial including sepsis, delirium, subclinical seizure and Keppra.  LTM EEG with subclinical seizure on 8/14, and no seizure on  8/15.  Patient is confused, and speech incomprehensible.  Does not follow command.  He is not agitated or combative.  MRI brain showed mildly increased size of previously treated right middle cranial fossa meningioma, similar exuberant surrounding edema, similar putative 1.2 cm right temporoparietal meningioma and similar right MCA bifurcation aneurysm.   -Neurology recommended continuing Vimpat, seizure precaution and outpatient follow-up with neurology and neurosurgery.  -Will try Decadron 4 mg twice daily for edema -Treat reversible causes. -Continue Zyprexa per psych. -Palliative medicine consulted.  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 Known lesion abutting the right cavernous sinus is poorly visualized due to CT technique.  Radiology recommended considering MRI for definitive characterization.  Previous attending discussed with neuro-oncology, Dr. Asher Muir who felt CT finding to be chronic and recommended no further imaging.  MRI brain as above. -Will try Decadron 4 mg twice daily for edema  Recurrent hypotension:  Unclear etiology of this.  Response to fluid quickly.  Likely due to poor p.o. intake.  I do not see any medication that can contribute to hypotension as far as I can tell. Had syncopal episode with hypotension when he was at [facility].  -NS bolus 1 L x 1 followed by NS 75 cc an hour -Check cortisol, TSH and echocardiogram -Goal of care discussion   Seizure disorder: No clinical seizure noted.  EEG with subclinical seizure -Management as above   Bilateral Hydronephrosis/possible BOO: Likely due to to enlarged prostate.  CT abdomen and pelvis with mild bilateral hydronephrosis.  Foley catheter placed in ED. Unclear if he came in with chronic Foley -Continue Foley catheter for now -Started Flomax on  8/15. -Voiding trial when able to get out of bed   AKI/metabolic acidosis: Resolved. Recent Labs    05/21/23 1430 06/11/23 1605 06/12/23 0552 06/13/23 0526 06/14/23 0543 06/15/23 1115 06/16/23 1032 06/17/23 0019 06/17/23 2008 06/18/23 0419  BUN 28* 66* 57* 36* 23 18 13 9 11 12   CREATININE 1.17 3.92* 2.06* 1.06 0.81 0.98 0.96 1.12 1.10 1.02  -Continue monitoring -Avoid nephrotoxic meds   Normocytic anemia/thrombocytopenia: Stable.  Anemia panel unrevealing. Recent Labs    05/17/23 1300 05/21/23 1430 06/11/23 1605 06/12/23 0552 06/13/23 0526 06/14/23 0543 06/15/23 1115 06/17/23 0019 06/17/23 2008 06/18/23 0419  HGB 11.7* 11.0* 9.4* 9.1* 8.9* 9.9* 10.5* 10.8* 11.1* 10.8*  -Continue monitoring  Hypokalemia/hypomagnesemia/hypophosphatemia -Monitor replenish as appropriate  Hypothyroidism: TSH low.  Free T4 slightly elevated to 1.22.  No clinical signs of hyperthyroidism. -Continue Synthroid -Recheck TFT in 4 to 6 weeks   Physical conditioning -PT/OT  Advance care planning: Frail patient with comorbidity as above.  Poor long-term prognosis.  Poor quality of life from talking to patient's daughter.  Unfortunately, was unable to get hold of patient's wife despite multiple calls.  Patient's daughter, Efraim Kaufmann reports gradual decline since his hospitalization in 03/2023.  She says he was able to walk independently after he left the hospital in May but does not know his recent physical or mental status since she does not live close by.  I have expressed my concern about CODE STATUS in light of his comorbidity and frailty.  I told her CPR and  intubation could pose more harm than benefit.  Efraim Kaufmann will talk to her mother and let us know. -Consult palliative care -Continue full code for now    Body mass index is 22.11 kg/m. Consult dietitian  DVT prophylaxis:  Place TED hose Start: 06/15/23 1119 enoxaparin (LOVENOX) injection 40 mg Start: 06/14/23 1000  Code Status: Full  code Family Communication: Attempted to call patient's wife twice but no answer.  Discussed with patient's daughter, Efraim Kaufmann over the phone.  Level of care: Telemetry Medical Status is: Inpatient Remains inpatient appropriate because: Acute encephalopathy, bacteremia, seizure, recurrent hypotension   Final disposition: SNF? Consultants:  Psychiatry Neurology Neuro-oncology  55 minutes with more than 50% spent in reviewing records, counseling patient/family and coordinating care.   Sch Meds:  Scheduled Meds:  Chlorhexidine Gluconate Cloth  6 each Topical Daily   dexamethasone (DECADRON) injection  4 mg Intravenous Q12H   enoxaparin (LOVENOX) injection  40 mg Subcutaneous Q24H   Gerhardt's butt cream   Topical BID   levothyroxine  50 mcg Oral Q0600   melatonin  3 mg Oral QHS   multivitamin with minerals  1 tablet Oral Daily   OLANZapine zydis  2.5 mg Oral Daily   OLANZapine zydis  5 mg Oral QHS   sodium chloride  1 g Oral BID WC   tamsulosin  0.4 mg Oral QPC supper   Continuous Infusions:  sodium chloride 50 mL/hr at 06/18/23 0700   lacosamide (VIMPAT) IV 75 mg (06/18/23 1015)   potassium PHOSPHATE IVPB (in mmol) Stopped (06/17/23 2349)   PRN Meds:.haloperidol lactate, midazolam  Antimicrobials: Anti-infectives (From admission, onward)    Start     Dose/Rate Route Frequency Ordered Stop   06/14/23 2200  sulfamethoxazole-trimethoprim (BACTRIM DS) 800-160 MG per tablet 1 tablet  Status:  Discontinued        1 tablet Oral 2 times daily 06/14/23 1750 06/18/23 1209   06/14/23 1400  ceFEPIme (MAXIPIME) 2 g in sodium chloride 0.9 % 100 mL IVPB  Status:  Discontinued        2 g 200 mL/hr over 30 Minutes Intravenous Every 8 hours 06/14/23 1228 06/14/23 1749   06/12/23 1600  cefTRIAXone (ROCEPHIN) 2 g in sodium chloride 0.9 % 100 mL IVPB  Status:  Discontinued        2 g 200 mL/hr over 30 Minutes Intravenous Every 24 hours 06/11/23 2104 06/14/23 1228   06/11/23 1615  cefTRIAXone  (ROCEPHIN) 1 g in sodium chloride 0.9 % 100 mL IVPB  Status:  Discontinued        1 g 200 mL/hr over 30 Minutes Intravenous Every 24 hours 06/11/23 1603 06/11/23 1608   06/11/23 1615  cefTRIAXone (ROCEPHIN) 2 g in sodium chloride 0.9 % 100 mL IVPB        2 g 200 mL/hr over 30 Minutes Intravenous  Once 06/11/23 1608 06/11/23 1741        I have personally reviewed the following labs and images: CBC: Recent Labs  Lab 06/11/23 1605 06/12/23 0552 06/13/23 0526 06/14/23 0543 06/15/23 1115 06/17/23 0019 06/17/23 2008 06/18/23 0419  WBC 28.3*   < > 13.1* 7.6 9.1 6.3 5.9 4.7  NEUTROABS 23.4*  --  11.2* 5.5 6.3  --   --   --   HGB 9.4*   < > 8.9* 9.9* 10.5* 10.8* 11.1* 10.8*  HCT 29.3*   < > 29.7* 31.2* 34.1* 34.0* 35.8* 33.4*  MCV 97.0   < > 102.1* 97.2 98.3 93.9 96.0 93.0  PLT 139*   < > 118* 128* 160 198 193 224   < > = values in this interval not displayed.   BMP &GFR Recent Labs  Lab 06/13/23 0526 06/14/23 0543 06/15/23 1115 06/16/23 1032 06/17/23 0019 06/17/23 2008 06/18/23 0419  NA 144 141 138 139 137 137 137  K 3.6 3.1* 3.5 3.8 3.9 4.1 4.2  CL 113* 108 107 103 102 103 103  CO2 21* 23 21* 26 27 19* 24  GLUCOSE 73 77 150* 104* 84 97 91  BUN 36* 23 18 13 9 11 12   CREATININE 1.06 0.81 0.98 0.96 1.12 1.10 1.02  CALCIUM 8.5* 8.3* 7.8* 8.1* 8.1* 7.9* 8.1*  MG 1.7 1.7 1.4* 2.1 1.7  --   --   PHOS 2.5 2.9 2.9 2.3* 2.1*  --   --    Estimated Creatinine Clearance: 53.3 mL/min (by C-G formula based on SCr of 1.02 mg/dL). Liver & Pancreas: Recent Labs  Lab 06/14/23 0543 06/15/23 1115 06/16/23 1032 06/17/23 0019 06/17/23 2008 06/18/23 0419  AST 29 22 32  --  24 19  ALT 20 17 24   --  17 17  ALKPHOS 52 52 47  --  47 49  BILITOT 0.6 1.0 0.2*  --  0.8 0.7  PROT 4.9* 5.4* 5.2*  --  5.5* 5.4*  ALBUMIN 2.4* 2.7* 2.5* 2.5* 2.7* 2.6*   No results for input(s): "LIPASE", "AMYLASE" in the last 168 hours. Recent Labs  Lab 06/16/23 0241  AMMONIA 37*   Diabetic: No  results for input(s): "HGBA1C" in the last 72 hours. Recent Labs  Lab 06/14/23 0732 06/15/23 1107 06/16/23 2105 06/17/23 1912 06/18/23 1148  GLUCAP 63* 150* 102* 111* 118*   Cardiac Enzymes: Recent Labs  Lab 06/16/23 1032  CKTOTAL 19*   No results for input(s): "PROBNP" in the last 8760 hours. Coagulation Profile: Recent Labs  Lab 06/11/23 1605  INR 1.7*   Thyroid Function Tests: Recent Labs    06/16/23 0241 06/16/23 1032  TSH 0.302*  --   FREET4  --  1.22*   Lipid Profile: No results for input(s): "CHOL", "HDL", "LDLCALC", "TRIG", "CHOLHDL", "LDLDIRECT" in the last 72 hours. Anemia Panel: No results for input(s): "VITAMINB12", "FOLATE", "FERRITIN", "TIBC", "IRON", "RETICCTPCT" in the last 72 hours. Urine analysis:    Component Value Date/Time   COLORURINE YELLOW 06/11/2023 1814   APPEARANCEUR CLEAR 06/11/2023 1814   LABSPEC 1.010 06/11/2023 1814   PHURINE 5.0 06/11/2023 1814   GLUCOSEU NEGATIVE 06/11/2023 1814   HGBUR LARGE (A) 06/11/2023 1814   BILIRUBINUR NEGATIVE 06/11/2023 1814   KETONESUR NEGATIVE 06/11/2023 1814   PROTEINUR NEGATIVE 06/11/2023 1814   NITRITE POSITIVE (A) 06/11/2023 1814   LEUKOCYTESUR MODERATE (A) 06/11/2023 1814   Sepsis Labs: Invalid input(s): "PROCALCITONIN", "LACTICIDVEN"  Microbiology: Recent Results (from the past 240 hour(s))  Resp panel by RT-PCR (RSV, Flu A&B, Covid) Anterior Nasal Swab     Status: None   Collection Time: 06/11/23  4:05 PM   Specimen: Anterior Nasal Swab  Result Value Ref Range Status   SARS Coronavirus 2 by RT PCR NEGATIVE NEGATIVE Final    Comment: (NOTE) SARS-CoV-2 target nucleic acids are NOT DETECTED.  The SARS-CoV-2 RNA is generally detectable in upper respiratory specimens during the acute phase of infection. The lowest concentration of SARS-CoV-2 viral copies this assay can detect is 138 copies/mL. A negative result does not preclude SARS-Cov-2 infection and should not be used as the sole  basis for treatment or other patient  management decisions. A negative result may occur with  improper specimen collection/handling, submission of specimen other than nasopharyngeal swab, presence of viral mutation(s) within the areas targeted by this assay, and inadequate number of viral copies(<138 copies/mL). A negative result must be combined with clinical observations, patient history, and epidemiological information. The expected result is Negative.  Fact Sheet for Patients:  BloggerCourse.com  Fact Sheet for Healthcare Providers:  SeriousBroker.it  This test is no t yet approved or cleared by the [place] FDA and  has been authorized for detection and/or diagnosis of SARS-CoV-2 by FDA under an Emergency Use Authorization (EUA). This EUA will remain  in effect (meaning this test can be used) for the duration of the COVID-19 declaration under Section 564(b)(1) of the Act, 21 U.S.C.section 360bbb-3(b)(1), unless the authorization is terminated  or revoked sooner.       Influenza A by PCR NEGATIVE NEGATIVE Final   Influenza B by PCR NEGATIVE NEGATIVE Final    Comment: (NOTE) The Xpert Xpress SARS-CoV-2/FLU/RSV plus assay is intended as an aid in the diagnosis of influenza from Nasopharyngeal swab specimens and should not be used as a sole basis for treatment. Nasal washings and aspirates are unacceptable for Xpert Xpress SARS-CoV-2/FLU/RSV testing.  Fact Sheet for Patients: BloggerCourse.com  Fact Sheet for Healthcare Providers: SeriousBroker.it  This test is not yet approved or cleared by the [place] FDA and has been authorized for detection and/or diagnosis of SARS-CoV-2 by FDA under an Emergency Use Authorization (EUA). This EUA will remain in effect (meaning this test can be used) for the duration of the COVID-19 declaration under Section 564(b)(1) of the Act,  21 U.S.C. section 360bbb-3(b)(1), unless the authorization is terminated or revoked.     Resp Syncytial Virus by PCR NEGATIVE NEGATIVE Final    Comment: (NOTE) Fact Sheet for Patients: BloggerCourse.com  Fact Sheet for Healthcare Providers: SeriousBroker.it  This test is not yet approved or cleared by the [place] FDA and has been authorized for detection and/or diagnosis of SARS-CoV-2 by FDA under an Emergency Use Authorization (EUA). This EUA will remain in effect (meaning this test can be used) for the duration of the COVID-19 declaration under Section 564(b)(1) of the Act, 21 U.S.C. section 360bbb-3(b)(1), unless the authorization is terminated or revoked.  Performed at [facility], [address], [place], [place] [address]   Blood Culture (routine x 2)     Status: Abnormal   Collection Time: 06/11/23  4:05 PM   Specimen: BLOOD  Result Value Ref Range Status   Specimen Description   Final    BLOOD SITE NOT SPECIFIED Performed at [facility], [address] [place], [place] [address]    Special Requests   Final    BOTTLES DRAWN AEROBIC AND ANAEROBIC Blood Culture adequate volume Performed at [facility], [address], [place], [place] [address]    Culture  Setup Time   Final    GRAM NEGATIVE RODS IN BOTH AEROBIC AND ANAEROBIC BOTTLES CRITICAL RESULT CALLED TO, READ BACK BY AND VERIFIED WITH: PHARMD ANH PHAM [phone] [id number] BY Berline Chough, [place] Performed at [facility], [address] [place], [place] [address]    Culture CITROBACTER FREUNDII (A)  Final   Report Status 06/14/2023 FINAL  Final   Organism ID, Bacteria CITROBACTER FREUNDII  Final      Susceptibility   Citrobacter freundii - MIC*    CEFEPIME <=0.12 SENSITIVE Sensitive     CEFTAZIDIME <=  1 SENSITIVE Sensitive     CEFTRIAXONE <=0.25 SENSITIVE Sensitive     CIPROFLOXACIN <=0.25 SENSITIVE  Sensitive     GENTAMICIN <=1 SENSITIVE Sensitive     IMIPENEM 0.5 SENSITIVE Sensitive     TRIMETH/SULFA <=20 SENSITIVE Sensitive     PIP/TAZO <=4 SENSITIVE Sensitive     * CITROBACTER FREUNDII  Blood Culture ID Panel (Reflexed)     Status: Abnormal   Collection Time: 06/11/23  4:05 PM  Result Value Ref Range Status   Enterococcus faecalis NOT DETECTED NOT DETECTED Final   Enterococcus Faecium NOT DETECTED NOT DETECTED Final   Listeria monocytogenes NOT DETECTED NOT DETECTED Final   Staphylococcus species NOT DETECTED NOT DETECTED Final   Staphylococcus aureus (BCID) NOT DETECTED NOT DETECTED Final   Staphylococcus epidermidis NOT DETECTED NOT DETECTED Final   Staphylococcus lugdunensis NOT DETECTED NOT DETECTED Final   Streptococcus species NOT DETECTED NOT DETECTED Final   Streptococcus agalactiae NOT DETECTED NOT DETECTED Final   Streptococcus pneumoniae NOT DETECTED NOT DETECTED Final   Streptococcus pyogenes NOT DETECTED NOT DETECTED Final   A.calcoaceticus-baumannii NOT DETECTED NOT DETECTED Final   Bacteroides fragilis NOT DETECTED NOT DETECTED Final   Enterobacterales DETECTED (A) NOT DETECTED Final    Comment: Enterobacterales represent a large order of gram negative bacteria, not a single organism. Refer to culture for further identification. CRITICAL RESULT CALLED TO, READ BACK BY AND VERIFIED WITH: PHARMD ANH PHAM [phone] [id number] BY J [id number], MT    Enterobacter cloacae complex NOT DETECTED NOT DETECTED Final   Escherichia coli NOT DETECTED NOT DETECTED Final   Klebsiella aerogenes NOT DETECTED NOT DETECTED Final   Klebsiella oxytoca NOT DETECTED NOT DETECTED Final   Klebsiella pneumoniae NOT DETECTED NOT DETECTED Final   Proteus species NOT DETECTED NOT DETECTED Final   Salmonella species NOT DETECTED NOT DETECTED Final   Serratia marcescens NOT DETECTED NOT DETECTED Final   Haemophilus influenzae NOT DETECTED NOT DETECTED Final   Neisseria meningitidis NOT DETECTED NOT  DETECTED Final   Pseudomonas aeruginosa NOT DETECTED NOT DETECTED Final   Stenotrophomonas maltophilia NOT DETECTED NOT DETECTED Final   Candida albicans NOT DETECTED NOT DETECTED Final   Candida auris NOT DETECTED NOT DETECTED Final   Candida glabrata NOT DETECTED NOT DETECTED Final   Candida krusei NOT DETECTED NOT DETECTED Final   Candida parapsilosis NOT DETECTED NOT DETECTED Final   Candida tropicalis NOT DETECTED NOT DETECTED Final   Cryptococcus neoformans/gattii NOT DETECTED NOT DETECTED Final   CTX-M ESBL NOT DETECTED NOT DETECTED Final   Carbapenem resistance IMP NOT DETECTED NOT DETECTED Final   Carbapenem resistance KPC NOT DETECTED NOT DETECTED Final   Carbapenem resistance NDM NOT DETECTED NOT DETECTED Final   Carbapenem resist OXA 48 LIKE NOT DETECTED NOT DETECTED Final   Carbapenem resistance VIM NOT DETECTED NOT DETECTED Final    Comment: Performed at [facility], [address] [place], [place] [address]  Blood Culture (routine x 2)     Status: Abnormal   Collection Time: 06/11/23  4:08 PM   Specimen: BLOOD LEFT FOREARM  Result Value Ref Range Status   Specimen Description   Final    BLOOD LEFT FOREARM Performed at [facility], [address] [place], [place] [address]    Special Requests   Final    BOTTLES DRAWN AEROBIC AND ANAEROBIC Blood Culture adequate volume Performed at [facility], [address], [place], [place] [address]    Culture  Setup Time   Final    GRAM NEGATIVE RODS IN BOTH AEROBIC AND ANAEROBIC BOTTLES CRITICAL VALUE NOTED.  VALUE IS CONSISTENT WITH PREVIOUSLY REPORTED AND CALLED VALUE.    Culture (A)  Final    CITROBACTER FREUNDII SUSCEPTIBILITIES PERFORMED ON PREVIOUS CULTURE WITHIN THE LAST 5 DAYS. Performed at [facility], [address], [place], [place] [address]    Report Status 06/16/2023 FINAL  Final  Urine Culture     Status: Abnormal   Collection Time: 06/11/23  6:14 PM   Specimen:  Urine, Random  Result Value Ref Range Status   Specimen Description   Final    URINE, RANDOM Performed at [facility], [address], [place], [place] [address]    Special Requests   Final    NONE Reflexed from [id number] Performed at [facility], [address], [place], [place] [address]    Culture >=100,000 COLONIES/mL CITROBACTER FREUNDII (A)  Final   Report Status 06/13/2023 FINAL  Final   Organism ID, Bacteria CITROBACTER FREUNDII (A)  Final      Susceptibility   Citrobacter freundii - MIC*    CEFEPIME <=0.12 SENSITIVE Sensitive     CEFTRIAXONE <=0.25 SENSITIVE Sensitive     CIPROFLOXACIN <=0.25 SENSITIVE Sensitive     GENTAMICIN <=1 SENSITIVE Sensitive     IMIPENEM 1 SENSITIVE Sensitive     NITROFURANTOIN <=16 SENSITIVE Sensitive     TRIMETH/SULFA <=20 SENSITIVE Sensitive     PIP/TAZO <=4 SENSITIVE Sensitive     * >=100,000 COLONIES/mL CITROBACTER FREUNDII    Radiology Studies: MR BRAIN W WO CONTRAST  Result Date: 06/17/2023 CLINICAL DATA:  Brain/CNS neoplasm, monitor EXAM: MRI HEAD WITHOUT AND WITH CONTRAST TECHNIQUE: Multiplanar, multiecho pulse sequences of the brain and surrounding structures were obtained without and with intravenous contrast. CONTRAST:  7mL GADAVIST GADOBUTROL 1 MMOL/ML IV SOLN COMPARISON:  MRI head March 3, 23. FINDINGS: Brain: Similar size of a 1.2 cm extra-axial mass along the right temporoparietal lobe. Similar versus mildly increased size of a 2.3 by 2.8 cm right parasellar mass sub which extends to involve the right cavernous sinus medially, right middle cranial fossa anteriorly and Meckel's cave posteriorly. Focus of nodular enhancement more medially is similar. Similar exuberant surrounding edema involving the right temporal lobe and right frontal lobe. Similar mild leftward midline shift. No evidence of acute infarct, acute hemorrhage or hydrocephalus. Vascular: Redemonstrated right MCA bifurcation aneurysm.  Skull and upper cervical spine: Normal marrow signal. Sinuses/Orbits: Clear sinuses.  No acute orbital findings. Other: No mastoid effusions. IMPRESSION: 1. Similar versus mildly increased size of the previously treated right middle cranial fossa meningioma with similar adjacent subcentimeter nodular focus of enhancement. Similar exuberant surrounding edema 2. Similar putative 1.2 cm right temporoparietal meningioma 3. Redemonstrated right MCA bifurcation aneurysm, not well assessed on this study. Electronically Signed   By: Feliberto Harts M.D.   On: 06/17/2023 18:42      Ammie Warrick T. Alaria Oconnor [facility]  If 7PM-7AM, please contact night-coverage [email or web address] 06/18/2023, 12:37 PM

## 2023-06-19 ENCOUNTER — Inpatient Hospital Stay (HOSPITAL_COMMUNITY): Payer: PPO

## 2023-06-19 DIAGNOSIS — A419 Sepsis, unspecified organism: Secondary | ICD-10-CM | POA: Diagnosis not present

## 2023-06-19 DIAGNOSIS — R008 Other abnormalities of heart beat: Secondary | ICD-10-CM | POA: Diagnosis not present

## 2023-06-19 DIAGNOSIS — I3139 Other pericardial effusion (noninflammatory): Secondary | ICD-10-CM | POA: Insufficient documentation

## 2023-06-19 DIAGNOSIS — N1330 Unspecified hydronephrosis: Secondary | ICD-10-CM | POA: Diagnosis not present

## 2023-06-19 DIAGNOSIS — E872 Acidosis, unspecified: Secondary | ICD-10-CM | POA: Diagnosis not present

## 2023-06-19 DIAGNOSIS — D329 Benign neoplasm of meninges, unspecified: Secondary | ICD-10-CM | POA: Diagnosis not present

## 2023-06-19 LAB — ECHOCARDIOGRAM COMPLETE
AR max vel: 1.87 cm2
AV Area VTI: 1.92 cm2
AV Area mean vel: 1.9 cm2
AV Mean grad: 10 mmHg
AV Peak grad: 16.3 mmHg
Ao pk vel: 2.02 m/s
Area-P 1/2: 3.42 cm2
Height: 70 in
P 1/2 time: 484 ms
S' Lateral: 2.9 cm
Weight: 2465.62 oz

## 2023-06-19 LAB — RENAL FUNCTION PANEL
Albumin: 2.6 g/dL — ABNORMAL LOW (ref 3.5–5.0)
Anion gap: 11 (ref 5–15)
BUN: 16 mg/dL (ref 8–23)
CO2: 20 mmol/L — ABNORMAL LOW (ref 22–32)
Calcium: 8 mg/dL — ABNORMAL LOW (ref 8.9–10.3)
Chloride: 103 mmol/L (ref 98–111)
Creatinine, Ser: 1.02 mg/dL (ref 0.61–1.24)
GFR, Estimated: >60 mL/min (ref >60)
Glucose, Bld: 133 mg/dL — ABNORMAL HIGH (ref 70–99)
Phosphorus: 2.9 mg/dL (ref 2.5–4.6)
Potassium: 4.9 mmol/L (ref 3.5–5.1)
Sodium: 134 mmol/L — ABNORMAL LOW (ref 135–145)

## 2023-06-19 LAB — CBC
HCT: 32.1 % — ABNORMAL LOW (ref 39.0–52.0)
Hemoglobin: 10.4 g/dL — ABNORMAL LOW (ref 13.0–17.0)
MCH: 30.2 pg (ref 26.0–34.0)
MCHC: 32.4 g/dL (ref 30.0–36.0)
MCV: 93.3 fL (ref 80.0–100.0)
Platelets: 245 10*3/uL (ref 150–400)
RBC: 3.44 MIL/uL — ABNORMAL LOW (ref 4.22–5.81)
RDW: 13.4 % (ref 11.5–15.5)
WBC: 4.4 10*3/uL (ref 4.0–10.5)
nRBC: 0 % (ref 0.0–0.2)

## 2023-06-19 LAB — MAGNESIUM: Magnesium: 1.6 mg/dL — ABNORMAL LOW (ref 1.7–2.4)

## 2023-06-19 LAB — URIC ACID: Uric Acid, Serum: 5.6 mg/dL (ref 3.7–8.6)

## 2023-06-19 MED ORDER — LACOSAMIDE 50 MG PO TABS
75.0000 mg | ORAL_TABLET | Freq: Two times a day (BID) | ORAL | Status: DC
  Administered 2023-06-19 – 2023-06-21 (×4): 75 mg via ORAL
  Filled 2023-06-19 (×4): qty 2

## 2023-06-19 MED ORDER — MIDODRINE HCL 5 MG PO TABS
5.0000 mg | ORAL_TABLET | Freq: Three times a day (TID) | ORAL | Status: DC
  Administered 2023-06-19 (×3): 5 mg via ORAL
  Filled 2023-06-19 (×3): qty 1

## 2023-06-19 MED ORDER — SENNOSIDES-DOCUSATE SODIUM 8.6-50 MG PO TABS
1.0000 | ORAL_TABLET | Freq: Every day | ORAL | Status: DC
  Administered 2023-06-19 – 2023-06-20 (×2): 1 via ORAL
  Filled 2023-06-19 (×2): qty 1

## 2023-06-19 MED ORDER — MAGNESIUM SULFATE 2 GM/50ML IV SOLN
2.0000 g | Freq: Once | INTRAVENOUS | Status: AC
  Administered 2023-06-19: 2 g via INTRAVENOUS
  Filled 2023-06-19: qty 50

## 2023-06-19 NOTE — Plan of Care (Signed)
?  Problem: Clinical Measurements: ?Goal: Will remain free from infection ?Outcome: Progressing ?  ?

## 2023-06-19 NOTE — Significant Event (Deleted)
Assisted patient back to bed from chair to go to [facility].

## 2023-06-19 NOTE — Plan of Care (Signed)
  Problem: Education: Goal: Knowledge of General Education information will improve Description Including pain rating scale, medication(s)/side effects and non-pharmacologic comfort measures Outcome: Progressing   

## 2023-06-19 NOTE — Progress Notes (Signed)
Assisted patient back to bed from chair to go to [facility] with transport.

## 2023-06-19 NOTE — Progress Notes (Addendum)
PROGRESS NOTE  Aaron Hickman UJW:[id number] DOB: 04-Mar-1936   PCP: [facility]  Patient is from: Home.  Lives with his wife.  Significant decline since his hospitalization in 03/2023  DOA: 06/11/2023 LOS: 8  Chief complaints Chief Complaint  Patient presents with   Altered Mental Status     Brief Narrative / Interim history: 87 year old M with PMH of seizure disorder, meningioma, chronic vasogenic edema, BPH and noncompliance with medication presenting with altered mental status and decreased p.o. intake, and admitted for sepsis due to urinary tract infection, acute metabolic encephalopathy with agitation and AKI.  He was febrile 201 and tachycardic.  Had leukocytosis to 28.3.  UA concerning for UTI.  Cultures obtained.  Started on IV ceftriaxone and IV fluid.  Later, urine and blood culture with pansensitive Citrobacter fruendii.  Initially, antibiotic escalated to cefepime and then de-escalated to Bactrim.  Patient has some behavioral issues and agitation for which she was started on Haldol and Zyprexa.  Psychiatry consulted and adjusted his Zyprexa and recommended neurology consultation due to concern about his Keppra contributing to behavioral disturbance.  Neurology recommended transfer to [facility] and changed his antiepileptics and started continuous EEG.  Neurology changed Keppra to Depakote and then to Vimpat due to elevated ammonia with Depakote.  Continuous EEG with subclinical seizure on 8/14 and no seizure on 8/15. EEG discontinued.  MRI brain with without contrast showed mildly increased size of previously treated right middle cranial fossa meningioma, similar exuberant surrounding edema, similar putative 1.2 cm right temporoparietal meningioma and similar right MCA bifurcation aneurysm.  Neurology recommended continuing Vimpat, seizure precaution and outpatient follow-up with neurology and neurosurgery.   Started on Decadron 4 mg twice daily on 8/16.  Confusion  and agitation improved.  CODE STATUS changed to DNR/DNI after discussion with family.  Plan for palliative meeting on 8/18.   Subjective: Seen and examined earlier this morning.  Slightly hypotensive overnight requiring bolus fluid.  He is awake and alert.  He is oriented to self and place but not time.  Does not follow commands consistently.  Asking about his wife.  Likes to go home.  Objective: Vitals:   06/18/23 2356 06/19/23 0408 06/19/23 0823 06/19/23 1141  BP: 128/71 (!) 99/55 137/66 (!) 146/96  Pulse: 92 97 84 84  Resp: 15  13 13   Temp: 97.8 F (36.6 C) 97.9 F (36.6 C) 97.7 F (36.5 C) 98 F (36.7 C)  TempSrc: Oral Axillary Oral Oral  SpO2:  97% 98% 100%  Weight:      Height:        Examination:  GENERAL: No apparent distress.  Nontoxic. HEENT: MMM.  Vision and hearing grossly intact.  NECK: Supple.  No apparent JVD.  RESP:  No IWOB.  Fair aeration bilaterally. CVS:  RRR. Heart sounds normal.  ABD/GI/GU: BS+. Abd soft, NTND.  MSK/EXT:  Moves extremities. No apparent deformity. No edema.  SKIN: no apparent skin lesion or wound NEURO: Awake and alert.  Oriented to self and place.  He is more intelligible.  Follows commands intermittently.  No focal neurodeficit PSYCH: Calm.  No agitation.  Procedures:  None  Microbiology summarized: COVID-19, influenza and RSV PCR nonreactive Blood and urine culture with pansensitive Citrobacter freundii  Assessment and plan: Principal Problem:   Sepsis secondary to UTI [facility]) Active Problems:   Meningioma (HCC)   Seizure (HCC)   Metabolic acidosis, normal anion gap (NAG)   Acute renal failure (ARF) (HCC)   Bilateral hydronephrosis  Acute metabolic encephalopathy   Goals of care, counseling/discussion   DNR (do not resuscitate)  Severe sepsis due to Citrobacter freundii UTI and bacteremia: POA.  Has fever, leukocytosis, tachycardia with AKI and mental status change on presentation.  Culture data as above.   -Ceftriaxone  >> cefepime >> Bactrim for a total of 7 days.  Completed on 8/16 -Started Flomax.  Voiding trial today, 8/17 -Monitor urine output  Acute metabolic encephalopathy/agitation: Multifactorial including sepsis, delirium, subclinical seizure and Keppra.  LTM EEG with subclinical seizure on 8/14, and no seizure on 8/15. MRI brain showed mildly increased size of previously treated right middle cranial fossa meningioma, similar exuberant surrounding edema, similar putative 1.2 cm right temporoparietal meningioma and similar right MCA bifurcation aneurysm.  Improved.  Speech more intelligible.  Oriented to self and place.  Follows commands better. -Neurology recs -Continue Vimpat, seizure precaution and outpatient f/p with neurology and neurosurgery.  -Started Decadron 4 mg twice daily on 8/16 -Continue Zyprexa per psych. -Palliative medicine consulted and planning to meet with family on 8/18.  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 Known lesion abutting the right cavernous sinus is poorly visualized due to CT technique.  Radiology recommended considering MRI for definitive characterization.  Previous attending discussed with neuro-oncology, Dr. Asher Muir who felt CT finding to be chronic and recommended no further imaging.  MRI brain as above. -Decadron as above. -Outpatient follow-up with neurosurgery  Recurrent hypotension:  Unclear etiology of this.  Response to fluid quickly.  Likely due to poor p.o. intake.  I do not see any medication that can contribute to hypotension as far as I can tell. Had syncopal episode with hypotension when he was at [facility].  Cortisol and TSH normal.  TTE with moderate pericardial effusion without tamponade.  Now BP slightly elevated with midodrine. -IV fluid discontinued -Cardiology consult for  pericardial effusion -DNR/DNI  Moderate pericardial effusion: No tamponade on TTE.  Contributing to his intermittent hypotension? -Cardiology consult  Addendum -Discussed with Dr. Graciela Husbands who had his colleague, Dr. Scharlene Gloss review TTE finding.  Effusion felt to be trivial to cause hemodynamic issue.  dr Val Eagle neal just looked at the echo>> he thinks the effusion is trivial --so not a hemodynamic issue. do you still think we should see him    Seizure disorder: No clinical seizure noted.  EEG with subclinical seizure -Management as above   Bilateral Hydronephrosis/possible BOO: Likely due to to enlarged prostate.  CT abdomen and pelvis with mild bilateral hydronephrosis.  Foley catheter placed in ED.  -Started Flomax on 8/15. -Voiding trial -Monitor urine output   AKI/metabolic acidosis: Resolved. Recent Labs    06/11/23 1605 06/12/23 0552 06/13/23 0526 06/14/23 0543 06/15/23 1115 06/16/23 1032 06/17/23 0019 06/17/23 2008 06/18/23 0419 06/19/23 0420  BUN 66* 57* 36* 23 18 13 9 11 12 16   CREATININE 3.92* 2.06* 1.06 0.81 0.98 0.96 1.12 1.10 1.02 1.02  -Continue monitoring -Avoid nephrotoxic meds   Normocytic anemia/thrombocytopenia: Stable.  Anemia panel unrevealing. Recent Labs    05/21/23 1430 06/11/23 1605 06/12/23 0552 06/13/23 0526 06/14/23 0543 06/15/23 1115 06/17/23 0019 06/17/23 2008 06/18/23 0419 06/19/23 0420  HGB 11.0* 9.4* 9.1* 8.9* 9.9* 10.5* 10.8* 11.1* 10.8* 10.4*  -Continue monitoring  Hypokalemia/hypomagnesemia/hypophosphatemia -Monitor replenish as appropriate  Hypothyroidism: Repeat TSH normal.  Free T4 slightly elevated -Continue Synthroid -Recheck TFT in 4  to 6 weeks   Physical conditioning -PT/OT  Advance care planning: DNR/DNI. -Palliative planning to meet family on 8/18    Body mass index is 22.11 kg/m. Consult dietitian  DVT prophylaxis:  Place TED hose Start: 06/15/23 1119 enoxaparin (LOVENOX) injection 40 mg Start: 06/14/23  1000  Code Status: DNR/DNI Family Communication: None at bedside today. Level of care: Telemetry Medical Status is: Inpatient Remains inpatient appropriate because: Acute encephalopathy, bacteremia, seizure, recurrent hypotension and pericardial effusion   Final disposition: SNF? Consultants:  Psychiatry Neurology Neuro-oncology Palliative medicine  55 minutes with more than 50% spent in reviewing records, counseling patient/family and coordinating care.   Sch Meds:  Scheduled Meds:  Chlorhexidine Gluconate Cloth  6 each Topical Daily   dexamethasone (DECADRON) injection  4 mg Intravenous Q12H   enoxaparin (LOVENOX) injection  40 mg Subcutaneous Q24H   Gerhardt's butt cream   Topical BID   lacosamide  75 mg Oral BID   levothyroxine  50 mcg Oral Q0600   melatonin  3 mg Oral QHS   midodrine  5 mg Oral TID WC   multivitamin with minerals  1 tablet Oral Daily   OLANZapine zydis  2.5 mg Oral Daily   OLANZapine zydis  5 mg Oral QHS   sodium chloride  1 g Oral BID WC   tamsulosin  0.4 mg Oral QPC supper   Continuous Infusions:  potassium PHOSPHATE IVPB (in mmol) Stopped (06/17/23 2349)   sodium chloride     PRN Meds:.haloperidol lactate, midazolam  Antimicrobials: Anti-infectives (From admission, onward)    Start     Dose/Rate Route Frequency Ordered Stop   06/14/23 2200  sulfamethoxazole-trimethoprim (BACTRIM DS) 800-160 MG per tablet 1 tablet  Status:  Discontinued        1 tablet Oral 2 times daily 06/14/23 1750 06/18/23 1209   06/14/23 1400  ceFEPIme (MAXIPIME) 2 g in sodium chloride 0.9 % 100 mL IVPB  Status:  Discontinued        2 g 200 mL/hr over 30 Minutes Intravenous Every 8 hours 06/14/23 1228 06/14/23 1749   06/12/23 1600  cefTRIAXone (ROCEPHIN) 2 g in sodium chloride 0.9 % 100 mL IVPB  Status:  Discontinued        2 g 200 mL/hr over 30 Minutes Intravenous Every 24 hours 06/11/23 2104 06/14/23 1228   06/11/23 1615  cefTRIAXone (ROCEPHIN) 1 g in sodium chloride  0.9 % 100 mL IVPB  Status:  Discontinued        1 g 200 mL/hr over 30 Minutes Intravenous Every 24 hours 06/11/23 1603 06/11/23 1608   06/11/23 1615  cefTRIAXone (ROCEPHIN) 2 g in sodium chloride 0.9 % 100 mL IVPB        2 g 200 mL/hr over 30 Minutes Intravenous  Once 06/11/23 1608 06/11/23 1741        I have personally reviewed the following labs and images: CBC: Recent Labs  Lab 06/13/23 0526 06/14/23 0543 06/15/23 1115 06/17/23 0019 06/17/23 2008 06/18/23 0419 06/19/23 0420  WBC 13.1* 7.6 9.1 6.3 5.9 4.7 4.4  NEUTROABS 11.2* 5.5 6.3  --   --   --   --   HGB 8.9* 9.9* 10.5* 10.8* 11.1* 10.8* 10.4*  HCT 29.7* 31.2* 34.1* 34.0* 35.8* 33.4* 32.1*  MCV 102.1* 97.2 98.3 93.9 96.0 93.0 93.3  PLT 118* 128* 160 198 193 224 245   BMP &GFR Recent Labs  Lab 06/14/23 0543 06/15/23 1115 06/16/23 1032 06/17/23 0019 06/17/23 2008 06/18/23 4098 06/19/23 0420  NA 141 138 139 137 137 137 134*  K 3.1* 3.5 3.8 3.9 4.1 4.2 4.9  CL 108 107 103 102 103 103 103  CO2 23 21* 26 27 19* 24 20*  GLUCOSE 77 150* 104* 84 97 91 133*  BUN 23 18 13 9 11 12 16   CREATININE 0.81 0.98 0.96 1.12 1.10 1.02 1.02  CALCIUM 8.3* 7.8* 8.1* 8.1* 7.9* 8.1* 8.0*  MG 1.7 1.4* 2.1 1.7  --   --  1.6*  PHOS 2.9 2.9 2.3* 2.1*  --   --  2.9   Estimated Creatinine Clearance: 53.3 mL/min (by C-G formula based on SCr of 1.02 mg/dL). Liver & Pancreas: Recent Labs  Lab 06/14/23 0543 06/15/23 1115 06/16/23 1032 06/17/23 0019 06/17/23 2008 06/18/23 0419 06/19/23 0420  AST 29 22 32  --  24 19  --   ALT 20 17 24   --  17 17  --   ALKPHOS 52 52 47  --  47 49  --   BILITOT 0.6 1.0 0.2*  --  0.8 0.7  --   PROT 4.9* 5.4* 5.2*  --  5.5* 5.4*  --   ALBUMIN 2.4* 2.7* 2.5* 2.5* 2.7* 2.6* 2.6*   No results for input(s): "LIPASE", "AMYLASE" in the last 168 hours. Recent Labs  Lab 06/16/23 0241  AMMONIA 37*   Diabetic: No results for input(s): "HGBA1C" in the last 72 hours. Recent Labs  Lab 06/14/23 0732  06/15/23 1107 06/16/23 2105 06/17/23 1912 06/18/23 1148  GLUCAP 63* 150* 102* 111* 118*   Cardiac Enzymes: Recent Labs  Lab 06/16/23 1032  CKTOTAL 19*   No results for input(s): "PROBNP" in the last 8760 hours. Coagulation Profile: No results for input(s): "INR", "PROTIME" in the last 168 hours.  Thyroid Function Tests: Recent Labs    06/18/23 1500  TSH 0.536   Lipid Profile: No results for input(s): "CHOL", "HDL", "LDLCALC", "TRIG", "CHOLHDL", "LDLDIRECT" in the last 72 hours. Anemia Panel: No results for input(s): "VITAMINB12", "FOLATE", "FERRITIN", "TIBC", "IRON", "RETICCTPCT" in the last 72 hours. Urine analysis:    Component Value Date/Time   COLORURINE YELLOW 06/11/2023 1814   APPEARANCEUR CLEAR 06/11/2023 1814   LABSPEC 1.010 06/11/2023 1814   PHURINE 5.0 06/11/2023 1814   GLUCOSEU NEGATIVE 06/11/2023 1814   HGBUR LARGE (A) 06/11/2023 1814   BILIRUBINUR NEGATIVE 06/11/2023 1814   KETONESUR NEGATIVE 06/11/2023 1814   PROTEINUR NEGATIVE 06/11/2023 1814   NITRITE POSITIVE (A) 06/11/2023 1814   LEUKOCYTESUR MODERATE (A) 06/11/2023 1814   Sepsis Labs: Invalid input(s): "PROCALCITONIN", "LACTICIDVEN"  Microbiology: Recent Results (from the past 240 hour(s))  Resp panel by RT-PCR (RSV, Flu A&B, Covid) Anterior Nasal Swab     Status: None   Collection Time: 06/11/23  4:05 PM   Specimen: Anterior Nasal Swab  Result Value Ref Range Status   SARS Coronavirus 2 by RT PCR NEGATIVE NEGATIVE Final    Comment: (NOTE) SARS-CoV-2 target nucleic acids are NOT DETECTED.  The SARS-CoV-2 RNA is generally detectable in upper respiratory specimens during the acute phase of infection. The lowest concentration of SARS-CoV-2 viral copies this assay can detect is 138 copies/mL. A negative result does not preclude SARS-Cov-2 infection and should not be used as the sole basis for treatment or other patient management decisions. A negative result may occur with  improper specimen  collection/handling, submission of specimen other than nasopharyngeal swab, presence of viral mutation(s) within the areas targeted by this assay, and inadequate number of viral copies(<138 copies/mL). A  negative result must be combined with clinical observations, patient history, and epidemiological information. The expected result is Negative.  Fact Sheet for Patients:  BloggerCourse.com  Fact Sheet for Healthcare Providers:  SeriousBroker.it  This test is no t yet approved or cleared by the [place] FDA and  has been authorized for detection and/or diagnosis of SARS-CoV-2 by FDA under an Emergency Use Authorization (EUA). This EUA will remain  in effect (meaning this test can be used) for the duration of the COVID-19 declaration under Section 564(b)(1) of the Act, 21 U.S.C.section 360bbb-3(b)(1), unless the authorization is terminated  or revoked sooner.       Influenza A by PCR NEGATIVE NEGATIVE Final   Influenza B by PCR NEGATIVE NEGATIVE Final    Comment: (NOTE) The Xpert Xpress SARS-CoV-2/FLU/RSV plus assay is intended as an aid in the diagnosis of influenza from Nasopharyngeal swab specimens and should not be used as a sole basis for treatment. Nasal washings and aspirates are unacceptable for Xpert Xpress SARS-CoV-2/FLU/RSV testing.  Fact Sheet for Patients: BloggerCourse.com  Fact Sheet for Healthcare Providers: SeriousBroker.it  This test is not yet approved or cleared by the [place] FDA and has been authorized for detection and/or diagnosis of SARS-CoV-2 by FDA under an Emergency Use Authorization (EUA). This EUA will remain in effect (meaning this test can be used) for the duration of the COVID-19 declaration under Section 564(b)(1) of the Act, 21 U.S.C. section 360bbb-3(b)(1), unless the authorization is terminated or revoked.     Resp Syncytial  Virus by PCR NEGATIVE NEGATIVE Final    Comment: (NOTE) Fact Sheet for Patients: BloggerCourse.com  Fact Sheet for Healthcare Providers: SeriousBroker.it  This test is not yet approved or cleared by the [place] FDA and has been authorized for detection and/or diagnosis of SARS-CoV-2 by FDA under an Emergency Use Authorization (EUA). This EUA will remain in effect (meaning this test can be used) for the duration of the COVID-19 declaration under Section 564(b)(1) of the Act, 21 U.S.C. section 360bbb-3(b)(1), unless the authorization is terminated or revoked.  Performed at [facility], [address], [place], [place] [address]   Blood Culture (routine x 2)     Status: Abnormal   Collection Time: 06/11/23  4:05 PM   Specimen: BLOOD  Result Value Ref Range Status   Specimen Description   Final    BLOOD SITE NOT SPECIFIED Performed at [facility], [address] [place], [place] [address]    Special Requests   Final    BOTTLES DRAWN AEROBIC AND ANAEROBIC Blood Culture adequate volume Performed at [facility], [address] [place], [place] [address]    Culture  Setup Time   Final    GRAM NEGATIVE RODS IN BOTH AEROBIC AND ANAEROBIC BOTTLES CRITICAL RESULT CALLED TO, READ BACK BY AND VERIFIED WITH: PHARMD ANH PHAM [phone] [id number] BY Berline Chough, [place] Performed at [facility], [address] [place], [place] [address]    Culture CITROBACTER FREUNDII (A)  Final   Report Status 06/14/2023 FINAL  Final   Organism ID, Bacteria CITROBACTER FREUNDII  Final      Susceptibility   Citrobacter freundii - MIC*    CEFEPIME <=0.12 SENSITIVE Sensitive     CEFTAZIDIME <=1 SENSITIVE Sensitive     CEFTRIAXONE <=0.25 SENSITIVE Sensitive     CIPROFLOXACIN <=0.25 SENSITIVE Sensitive     GENTAMICIN <=1 SENSITIVE Sensitive     IMIPENEM 0.5 SENSITIVE Sensitive  TRIMETH/SULFA  <=20 SENSITIVE Sensitive     PIP/TAZO <=4 SENSITIVE Sensitive     * CITROBACTER FREUNDII  Blood Culture ID Panel (Reflexed)     Status: Abnormal   Collection Time: 06/11/23  4:05 PM  Result Value Ref Range Status   Enterococcus faecalis NOT DETECTED NOT DETECTED Final   Enterococcus Faecium NOT DETECTED NOT DETECTED Final   Listeria monocytogenes NOT DETECTED NOT DETECTED Final   Staphylococcus species NOT DETECTED NOT DETECTED Final   Staphylococcus aureus (BCID) NOT DETECTED NOT DETECTED Final   Staphylococcus epidermidis NOT DETECTED NOT DETECTED Final   Staphylococcus lugdunensis NOT DETECTED NOT DETECTED Final   Streptococcus species NOT DETECTED NOT DETECTED Final   Streptococcus agalactiae NOT DETECTED NOT DETECTED Final   Streptococcus pneumoniae NOT DETECTED NOT DETECTED Final   Streptococcus pyogenes NOT DETECTED NOT DETECTED Final   A.calcoaceticus-baumannii NOT DETECTED NOT DETECTED Final   Bacteroides fragilis NOT DETECTED NOT DETECTED Final   Enterobacterales DETECTED (A) NOT DETECTED Final    Comment: Enterobacterales represent a large order of gram negative bacteria, not a single organism. Refer to culture for further identification. CRITICAL RESULT CALLED TO, READ BACK BY AND VERIFIED WITH: PHARMD ANH PHAM [phone] [id number] BY J [id number], MT    Enterobacter cloacae complex NOT DETECTED NOT DETECTED Final   Escherichia coli NOT DETECTED NOT DETECTED Final   Klebsiella aerogenes NOT DETECTED NOT DETECTED Final   Klebsiella oxytoca NOT DETECTED NOT DETECTED Final   Klebsiella pneumoniae NOT DETECTED NOT DETECTED Final   Proteus species NOT DETECTED NOT DETECTED Final   Salmonella species NOT DETECTED NOT DETECTED Final   Serratia marcescens NOT DETECTED NOT DETECTED Final   Haemophilus influenzae NOT DETECTED NOT DETECTED Final   Neisseria meningitidis NOT DETECTED NOT DETECTED Final   Pseudomonas aeruginosa NOT DETECTED NOT DETECTED Final   Stenotrophomonas maltophilia  NOT DETECTED NOT DETECTED Final   Candida albicans NOT DETECTED NOT DETECTED Final   Candida auris NOT DETECTED NOT DETECTED Final   Candida glabrata NOT DETECTED NOT DETECTED Final   Candida krusei NOT DETECTED NOT DETECTED Final   Candida parapsilosis NOT DETECTED NOT DETECTED Final   Candida tropicalis NOT DETECTED NOT DETECTED Final   Cryptococcus neoformans/gattii NOT DETECTED NOT DETECTED Final   CTX-M ESBL NOT DETECTED NOT DETECTED Final   Carbapenem resistance IMP NOT DETECTED NOT DETECTED Final   Carbapenem resistance KPC NOT DETECTED NOT DETECTED Final   Carbapenem resistance NDM NOT DETECTED NOT DETECTED Final   Carbapenem resist OXA 48 LIKE NOT DETECTED NOT DETECTED Final   Carbapenem resistance VIM NOT DETECTED NOT DETECTED Final    Comment: Performed at [facility], [address], [place], [place] [address]  Blood Culture (routine x 2)     Status: Abnormal   Collection Time: 06/11/23  4:08 PM   Specimen: BLOOD LEFT FOREARM  Result Value Ref Range Status   Specimen Description   Final    BLOOD LEFT FOREARM Performed at [facility], [address], [place], [place] [address]    Special Requests   Final    BOTTLES DRAWN AEROBIC AND ANAEROBIC Blood Culture adequate volume Performed at [facility], [address] [place], [place] [address]    Culture  Setup Time   Final    GRAM NEGATIVE RODS IN BOTH AEROBIC AND ANAEROBIC BOTTLES CRITICAL VALUE NOTED.  VALUE IS CONSISTENT WITH PREVIOUSLY REPORTED AND CALLED VALUE.    Culture (A)  Final  CITROBACTER FREUNDII SUSCEPTIBILITIES PERFORMED ON PREVIOUS CULTURE WITHIN THE LAST 5 DAYS. Performed at [facility], [address], [place], [place] [address]    Report Status 06/16/2023 FINAL  Final  Urine Culture     Status: Abnormal   Collection Time: 06/11/23  6:14 PM   Specimen: Urine, Random  Result Value Ref Range Status   Specimen Description   Final    URINE, RANDOM Performed  at [facility], [address] [place], [place] [address]    Special Requests   Final    NONE Reflexed from [id number] Performed at [facility], [address] [place], [place] [address]    Culture >=100,000 COLONIES/mL CITROBACTER FREUNDII (A)  Final   Report Status 06/13/2023 FINAL  Final   Organism ID, Bacteria CITROBACTER FREUNDII (A)  Final      Susceptibility   Citrobacter freundii - MIC*    CEFEPIME <=0.12 SENSITIVE Sensitive     CEFTRIAXONE <=0.25 SENSITIVE Sensitive     CIPROFLOXACIN <=0.25 SENSITIVE Sensitive     GENTAMICIN <=1 SENSITIVE Sensitive     IMIPENEM 1 SENSITIVE Sensitive     NITROFURANTOIN <=16 SENSITIVE Sensitive     TRIMETH/SULFA <=20 SENSITIVE Sensitive     PIP/TAZO <=4 SENSITIVE Sensitive     * >=100,000 COLONIES/mL CITROBACTER FREUNDII    Radiology Studies: ECHOCARDIOGRAM COMPLETE  Result Date: 06/19/2023    ECHOCARDIOGRAM REPORT   Patient Name:   DAVIT BRETL Date of Exam: 06/19/2023 Medical Rec #:  [id number]         Height:       70.0 in Accession #:    [id number]        Weight:       154.1 lb Date of Birth:  12-13-1935         BSA:          1.868 m Patient Age:    86 years          BP:           137/66 mmHg Patient Gender: M                 HR:           94 bpm. Exam Location:  Inpatient Procedure: 2D Echo, Color Doppler and Cardiac Doppler Indications:    Other abnormalities of the heart R00.8  History:        Patient has no prior history of Echocardiogram examinations.                 Risk Factors:Non-Smoker.  Sonographer:    Dondra Prader RVT RCS Referring Phys: [id number] Boyce Medici Nereida Schepp  Sonographer Comments: Suboptimal subcostal window. Image acquisition challenging due to respiratory motion. IMPRESSIONS  1. Left ventricular ejection fraction, by estimation, is 60 to 65%. The left ventricle has normal function. The left ventricle has no regional wall motion abnormalities. There is mild left ventricular hypertrophy. Left  ventricular diastolic parameters are consistent with Grade I diastolic dysfunction (impaired relaxation).  2. Right ventricular systolic function was not well visualized. The right ventricular size is not well visualized.  3. Moderate pericardial effusion. The pericardial effusion is circumferential. There is no evidence of cardiac tamponade.  4. The mitral valve is normal in structure. No evidence of mitral valve regurgitation. No evidence of mitral stenosis.  5. The tricuspid valve is abnormal.  6. The aortic valve is tricuspid. There is moderate calcification of the aortic valve. There is  moderate thickening of the aortic valve. Aortic valve regurgitation is mild. Mild aortic valve stenosis.  7. The inferior vena cava is normal in size with greater than 50% respiratory variability, suggesting right atrial pressure of 3 mmHg. FINDINGS  Left Ventricle: Left ventricular ejection fraction, by estimation, is 60 to 65%. The left ventricle has normal function. The left ventricle has no regional wall motion abnormalities. The left ventricular internal cavity size was normal in size. There is  mild left ventricular hypertrophy. Left ventricular diastolic parameters are consistent with Grade I diastolic dysfunction (impaired relaxation). Normal left ventricular filling pressure. Right Ventricle: The right ventricular size is not well visualized. Right vetricular wall thickness was not well visualized. Right ventricular systolic function was not well visualized. Left Atrium: Left atrial size was normal in size. Right Atrium: Right atrial size was normal in size. Pericardium: A moderately sized pericardial effusion is present. The pericardial effusion is circumferential. There is no evidence of cardiac tamponade. Mitral Valve: The mitral valve is normal in structure. No evidence of mitral valve regurgitation. No evidence of mitral valve stenosis. Tricuspid Valve: The tricuspid valve is abnormal. Tricuspid valve regurgitation  is mild . No evidence of tricuspid stenosis. Aortic Valve: The aortic valve is tricuspid. There is moderate calcification of the aortic valve. There is moderate thickening of the aortic valve. There is moderate aortic valve annular calcification. Aortic valve regurgitation is mild. Aortic regurgitation PHT measures 484 msec. Mild aortic stenosis is present. Aortic valve mean gradient measures 10.0 mmHg. Aortic valve peak gradient measures 16.3 mmHg. Aortic valve area, by VTI measures 1.92 cm. Pulmonic Valve: The pulmonic valve was not well visualized. Pulmonic valve regurgitation is not visualized. No evidence of pulmonic stenosis. Aorta: The aortic root and ascending aorta are structurally normal, with no evidence of dilitation. Venous: The inferior vena cava is normal in size with greater than 50% respiratory variability, suggesting right atrial pressure of 3 mmHg. IAS/Shunts: The interatrial septum was not well visualized.  LEFT VENTRICLE PLAX 2D LVIDd:         4.05 cm   Diastology LVIDs:         2.90 cm   LV e' medial:    0.06 cm/s LV PW:         1.20 cm   LV E/e' medial:  11.4 LV IVS:        1.15 cm   LV e' lateral:   0.06 cm/s LVOT diam:     2.00 cm   LV E/e' lateral: 11.0 LV SV:         71 LV SV Index:   38 LVOT Area:     3.14 cm  IVC IVC diam: 1.50 cm LEFT ATRIUM             Index LA diam:        4.20 cm 2.25 cm/m LA Vol (A2C):   42.4 ml 22.69 ml/m LA Vol (A4C):   48.0 ml 25.66 ml/m LA Biplane Vol: 37.8 ml 20.23 ml/m  AORTIC VALVE                     PULMONIC VALVE AV Area (Vmax):    1.87 cm      PV Vmax:       0.77 m/s AV Area (Vmean):   1.90 cm      PV Peak grad:  2.4 mmHg AV Area (VTI):     1.92 cm AV Vmax:  202.00 cm/s AV Vmean:          134.000 cm/s AV VTI:            0.372 m AV Peak Grad:      16.3 mmHg AV Mean Grad:      10.0 mmHg LVOT Vmax:         120.00 cm/s LVOT Vmean:        81.200 cm/s LVOT VTI:          0.227 m LVOT/AV VTI ratio: 0.61 AI PHT:            484 msec  AORTA Ao Root  diam: 3.20 cm Ao Asc diam:  3.40 cm MITRAL VALVE                TRICUSPID VALVE MV Area (PHT): 3.42 cm     TR Peak grad:   17.6 mmHg MV Decel Time: 222 msec     TR Vmax:        210.00 cm/s MV E velocity: 0.66 cm/s MV A velocity: 110.00 cm/s  SHUNTS MV E/A ratio:  0.01         Systemic VTI:  0.23 m                             Systemic Diam: 2.00 cm Dina Rich MD Electronically signed by Dina Rich MD Signature Date/Time: 06/19/2023/1:25:59 PM    Final       Loriene Taunton T. Derrious Bologna [facility]  If 7PM-7AM, please contact night-coverage [email or web address] 06/19/2023, 3:18 PM

## 2023-06-19 NOTE — Plan of Care (Signed)
  Problem: Education: Goal: Knowledge of General Education information will improve Description: Including pain rating scale, medication(s)/side effects and non-pharmacologic comfort measures 06/19/2023 2302 by Beryle Flock, RN Outcome: Progressing 06/19/2023 2302 by Beryle Flock, RN Outcome: Progressing

## 2023-06-19 NOTE — Progress Notes (Signed)
*  PRELIMINARY RESULTS* Echocardiogram 2D Echocardiogram has been performed.  Aaron Hickman 06/19/2023, 11:18 AM

## 2023-06-20 DIAGNOSIS — Z66 Do not resuscitate: Secondary | ICD-10-CM

## 2023-06-20 DIAGNOSIS — D329 Benign neoplasm of meninges, unspecified: Secondary | ICD-10-CM | POA: Diagnosis not present

## 2023-06-20 DIAGNOSIS — E440 Moderate protein-calorie malnutrition: Secondary | ICD-10-CM | POA: Insufficient documentation

## 2023-06-20 DIAGNOSIS — Z7189 Other specified counseling: Secondary | ICD-10-CM | POA: Diagnosis not present

## 2023-06-20 DIAGNOSIS — I3139 Other pericardial effusion (noninflammatory): Secondary | ICD-10-CM

## 2023-06-20 DIAGNOSIS — Z515 Encounter for palliative care: Secondary | ICD-10-CM | POA: Diagnosis not present

## 2023-06-20 DIAGNOSIS — E872 Acidosis, unspecified: Secondary | ICD-10-CM | POA: Diagnosis not present

## 2023-06-20 DIAGNOSIS — N1330 Unspecified hydronephrosis: Secondary | ICD-10-CM | POA: Diagnosis not present

## 2023-06-20 DIAGNOSIS — A419 Sepsis, unspecified organism: Secondary | ICD-10-CM | POA: Diagnosis not present

## 2023-06-20 LAB — CBC
HCT: 29.8 % — ABNORMAL LOW (ref 39.0–52.0)
Hemoglobin: 9.6 g/dL — ABNORMAL LOW (ref 13.0–17.0)
MCH: 29.7 pg (ref 26.0–34.0)
MCHC: 32.2 g/dL (ref 30.0–36.0)
MCV: 92.3 fL (ref 80.0–100.0)
Platelets: 267 10*3/uL (ref 150–400)
RBC: 3.23 MIL/uL — ABNORMAL LOW (ref 4.22–5.81)
RDW: 13.5 % (ref 11.5–15.5)
WBC: 12.3 10*3/uL — ABNORMAL HIGH (ref 4.0–10.5)
nRBC: 0 % (ref 0.0–0.2)

## 2023-06-20 LAB — RENAL FUNCTION PANEL
Albumin: 2.6 g/dL — ABNORMAL LOW (ref 3.5–5.0)
Anion gap: 7 (ref 5–15)
BUN: 21 mg/dL (ref 8–23)
CO2: 22 mmol/L (ref 22–32)
Calcium: 8.2 mg/dL — ABNORMAL LOW (ref 8.9–10.3)
Chloride: 105 mmol/L (ref 98–111)
Creatinine, Ser: 0.85 mg/dL (ref 0.61–1.24)
GFR, Estimated: >60 mL/min (ref >60)
Glucose, Bld: 132 mg/dL — ABNORMAL HIGH (ref 70–99)
Phosphorus: 2.9 mg/dL (ref 2.5–4.6)
Potassium: 4.5 mmol/L (ref 3.5–5.1)
Sodium: 134 mmol/L — ABNORMAL LOW (ref 135–145)

## 2023-06-20 LAB — SEDIMENTATION RATE: Sed Rate: 20 mm/h — ABNORMAL HIGH (ref 0–16)

## 2023-06-20 LAB — C-REACTIVE PROTEIN: CRP: <0.5 mg/dL (ref <1.0)

## 2023-06-20 LAB — MAGNESIUM: Magnesium: 2 mg/dL (ref 1.7–2.4)

## 2023-06-20 MED ORDER — ENSURE ENLIVE PO LIQD
237.0000 mL | Freq: Two times a day (BID) | ORAL | Status: DC
  Administered 2023-06-20 – 2023-06-21 (×4): 237 mL via ORAL
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
## 2023-06-20 NOTE — Progress Notes (Signed)
Patient gotten progressively agitated wanting to leave the hospital after his family left, stating that we are holding him here. Attempts made to reorient and educate patient. Patient found trying to get out of bed several times and was found out in the halls trying to go home. RN gave 1mg  haldol for agitation. Bed alarm remains on.

## 2023-06-20 NOTE — Care Management (Signed)
LVM w pt's daughter Efraim Kaufmann at request of PMT to discuss [facility], awaiting callback.

## 2023-06-20 NOTE — Plan of Care (Signed)
  Problem: Education: Goal: Knowledge of General Education information will improve Description Including pain rating scale, medication(s)/side effects and non-pharmacologic comfort measures Outcome: Progressing   

## 2023-06-20 NOTE — TOC Transition Note (Signed)
Transition of Care [facility]) - CM/SW Discharge Note   Patient Details  Name: Aaron Hickman MRN: [id number] Date of Birth: 11-14-1935  [facility]) CM/SW Contact:  Lawerance Sabal, RN Phone Number: 06/20/2023, 4:02 PM   Clinical Narrative:      Spoke with patient's daughter Aaron Hickman who confirmed family's choice for home hospice. Chose [facility] and referral made.  [name] is point of contact for DC planning.  Patient will return to address on file and wife will be support person at home. Patient is RA, no DME needs identified for DC. Daughter feels family will be able to transport home.    Final next level of care: Home/Self Care Barriers to Discharge: Continued Medical Work up   Patient Goals and CMS Choice CMS Medicare.gov Compare Post Acute Care list provided to:: Patient Represenative (must comment) Choice offered to / list presented to : Adult Children  Discharge Placement                         Discharge Plan and Services Additional resources added to the After Visit Summary for     Discharge Planning Services: CM Consult Post Acute Care Choice: Home Health                    HH Arranged: RN, PT, OT, Nurse's Aide, Social Work [facility] Agency: [facility] Date [facility] Agency Contacted: 06/20/23 Time HH Agency Contacted: 1601 Representative spoke with at [facility] Agency: [name]  Social Determinants of Health (SDOH) Interventions SDOH Screenings   Food Insecurity: No Food Insecurity (06/17/2023)  Housing: Low Risk  (06/17/2023)  Transportation Needs: No Transportation Needs (06/17/2023)  Utilities: Not At Risk (06/17/2023)  Tobacco Use: Medium Risk (05/21/2023)     Readmission Risk Interventions    06/14/2023   11:42 AM  Readmission Risk Prevention Plan  Transportation Screening Complete  PCP or Specialist Appt within 3-5 Days Complete  HRI or Home Care Consult Complete  Social Work Consult for Recovery Care  Planning/Counseling Complete  Palliative Care Screening Not Applicable  Medication Review Oceanographer) Complete

## 2023-06-20 NOTE — Progress Notes (Signed)
Initial Nutrition Assessment  DOCUMENTATION CODES:   Non-severe (moderate) malnutrition in context of acute illness/injury  INTERVENTION:  - Liberalize diet to Regular.   - Add Ensure Enlive po BID, each supplement provides 350 kcal and 20 grams of protein.  NUTRITION DIAGNOSIS:   Moderate Malnutrition related to acute illness as evidenced by energy intake < 75% for > 7 days, percent weight loss.  GOAL:   Patient will meet greater than or equal to 90% of their needs  MONITOR:   PO intake, Supplement acceptance  REASON FOR ASSESSMENT:   Consult Assessment of nutrition requirement/status  ASSESSMENT:   87 y.o. male admits related to AMS. PMH includes: medial sphenoid meningoma s/p radiation, chronic vasogenic edema (noncompliant with steroids), seizures, BPH. Pt is currently receiving medical management related to Severe Sepsis due to Citrobacter freundii UTI and bacteremia.  Meds reviewed: decadron, MVI, senokot, sodium chloride. Labs reviewed: Na low.   Pt is oriented x2. RD unable to reach pt via phone. Pt is currently on a heart healthy diet. Per record, pt has eaten </= 50% of his meals since admission (>7 days), with the exception of one meal at 75%. RD will liberalize to a Regular diet. Will also add Ensure BID for now. Per record, pt has experienced a 9.3% wt loss in less than 3 months. RD will continue to monitor PO intakes and attempt to gather nutrition hx details at f/u.   NUTRITION - FOCUSED PHYSICAL EXAM: Remote assessment.   Diet Order:   Diet Order             Diet Heart Room service appropriate? Yes; Fluid consistency: Thin  Diet effective now                   EDUCATION NEEDS:   Not appropriate for education at this time  Skin:  Skin Assessment: Reviewed RN Assessment  Last BM:  06/14/23  Height:   Ht Readings from Last 1 Encounters:  06/12/23 5\' 10"  (1.778 m)    Weight:   Wt Readings from Last 1 Encounters:  06/12/23 69.9 kg     Ideal Body Weight:     BMI:  Body mass index is 22.11 kg/m.  Estimated Nutritional Needs:   Kcal:  1750-2100 kcals  Protein:  90-105 gm  Fluid:  >/= 1.7 L  Bethann Humble, RD, LDN, CNSC.

## 2023-06-20 NOTE — Progress Notes (Signed)
PROGRESS NOTE  Aaron Hickman AVW:[id number] DOB: 08/23/1936   PCP: [facility]  Patient is from: Home.  Lives with his wife.  Significant decline since his hospitalization in 03/2023  DOA: 06/11/2023 LOS: 9  Chief complaints Chief Complaint  Patient presents with   Altered Mental Status     Brief Narrative / Interim history: 87 year old M with PMH of seizure disorder, meningioma, chronic vasogenic edema, BPH and noncompliance with medication presenting with altered mental status and decreased p.o. intake, and admitted for sepsis due to urinary tract infection, acute metabolic encephalopathy with agitation and AKI.  He was febrile 201 and tachycardic.  Had leukocytosis to 28.3.  UA concerning for UTI.  Cultures obtained.  Started on IV ceftriaxone and IV fluid.  Later, urine and blood culture with pansensitive Citrobacter fruendii.  Initially, antibiotic escalated to cefepime and then de-escalated to Bactrim.  Patient has some behavioral issues and agitation for which she was started on Haldol and Zyprexa.  Psychiatry consulted and adjusted his Zyprexa and recommended neurology consultation due to concern about his Keppra contributing to behavioral disturbance.  Neurology recommended transfer to [facility] and changed his antiepileptics and started continuous EEG.  Neurology changed Keppra to Depakote and then to Vimpat due to elevated ammonia with Depakote.  Continuous EEG with subclinical seizure on 8/14 and no seizure on 8/15. EEG discontinued.  MRI brain with without contrast showed mildly increased size of previously treated right middle cranial fossa meningioma, similar exuberant surrounding edema, similar putative 1.2 cm right temporoparietal meningioma and similar right MCA bifurcation aneurysm.  Neurology recommended continuing Vimpat, seizure precaution and outpatient follow-up with neurology and neurosurgery.   Started on Decadron 4 mg twice daily on 8/16.  Confusion  and agitation improved.  CODE STATUS changed to DNR/DNI after discussion with family.  Plan for palliative meeting on 8/18.   Subjective: Seen and examined earlier this morning.  No major events overnight of this morning.  Blood pressure remained stable.  Passed voiding trial.  Sitting on bedside chair.  No complaints.  Per RN, walked in the room with supervision and assistance.  Objective: Vitals:   06/20/23 0026 06/20/23 0426 06/20/23 0758 06/20/23 1235  BP: (!) 140/74 (!) 151/77 139/72 137/74  Pulse: 67 74 73 75  Resp:  14 20 20   Temp: 97.6 F (36.4 C) 98.2 F (36.8 C) 98.1 F (36.7 C) 97.7 F (36.5 C)  TempSrc: Oral Oral Oral Oral  SpO2: 99% 99% 100% 100%  Weight:      Height:        Examination:  GENERAL: No apparent distress.  Nontoxic. HEENT: MMM.  Vision grossly intact.  Hard of hearing. NECK: Supple.  No apparent JVD.  RESP:  No IWOB.  Fair aeration bilaterally. CVS:  RRR. Heart sounds normal.  ABD/GI/GU: BS+. Abd soft, NTND.  MSK/EXT:  Moves extremities. No apparent deformity. No edema.  SKIN: no apparent skin lesion or wound NEURO: Awake and alert.  Oriented to self and place but limited by hard of hearing.  Follows commands.  No focal neurodeficit PSYCH: Calm.  No agitation.  Procedures:  None  Microbiology summarized: COVID-19, influenza and RSV PCR nonreactive Blood and urine culture with pansensitive Citrobacter freundii  Assessment and plan: Principal Problem:   Sepsis secondary to UTI [facility]) Active Problems:   Meningioma (HCC)   Seizure (HCC)   Metabolic acidosis, normal anion gap (NAG)   Acute renal failure (ARF) (HCC)   Bilateral hydronephrosis   Acute metabolic  encephalopathy   Goals of care, counseling/discussion   DNR (do not resuscitate)   Pericardial effusion   Malnutrition of moderate degree  Severe sepsis due to Citrobacter freundii UTI and bacteremia: POA.  Has fever, leukocytosis, tachycardia with AKI and mental status change on  presentation.  Culture data as above.   -Ceftriaxone >> cefepime >> Bactrim for a total of 7 days.  Completed on 8/16 -Continue Flomax.  Passed voiding trial on 8/17. -Monitor urine output.  Intermittent bladder scan.  Acute metabolic encephalopathy/agitation: Multifactorial including sepsis, delirium, subclinical seizure and Keppra.  LTM EEG with subclinical seizure on 8/14, and no seizure on 8/15. MRI brain showed mildly increased size of previously treated right middle cranial fossa meningioma, similar exuberant surrounding edema, similar putative 1.2 cm right temporoparietal meningioma and similar right MCA bifurcation aneurysm.  Improved.  Speech more intelligible.  Oriented to self and place.  Follows commands better. -Neurology recs -Continue Vimpat, seizure precaution and outpatient f/p with neurology and neurosurgery.  -Started Decadron 4 mg twice daily on 8/16 -Continue Zyprexa per psych. -Palliative medicine consulted and planning to meet with family on 8/18.  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 Known lesion abutting the right cavernous sinus is poorly visualized due to CT technique.  Radiology recommended considering MRI for definitive characterization.  Previous attending discussed with neuro-oncology, Dr. Asher Muir who felt CT finding to be chronic and recommended no further imaging.  MRI brain as above. -Decadron as above. -Outpatient follow-up with neurosurgery  Recurrent hypotension: Unclear etiology but responds to fluid.  Likely dehydration.  Cortisol and TSH normal.  TTE with "moderate pericardial effusion without tamponade".  TTE finding discussed with cardiology, Dr. Graciela Husbands and felt to be trivial to have hemodynamic effect.  Blood pressure stable off IV fluid.  -Discontinue midodrine  Moderate pericardial  effusion? No tamponade on TTE.  Discussed with cardiology, Dr. Graciela Husbands.  Effusion felt to be trivial to have hemodynamic effect.  Uric acid, CRP and ESR within normal.   Seizure disorder: No clinical seizure noted.  EEG with subclinical seizure -Management as above   Bilateral Hydronephrosis/possible BOO: Likely due to to enlarged prostate.  CT abdomen and pelvis with mild bilateral hydronephrosis.  Foley catheter placed in ED. Passed voiding trial on 8/17. -Continue Flomax. -Monitor urine output   AKI/metabolic acidosis: Resolved. Recent Labs    06/12/23 0552 06/13/23 0526 06/14/23 0543 06/15/23 1115 06/16/23 1032 06/17/23 0019 06/17/23 2008 06/18/23 0419 06/19/23 0420 06/20/23 0332  BUN 57* 36* 23 18 13 9 11 12 16 21   CREATININE 2.06* 1.06 0.81 0.98 0.96 1.12 1.10 1.02 1.02 0.85  -Continue monitoring -Avoid nephrotoxic meds   Normocytic anemia/thrombocytopenia: Stable.  Anemia panel unrevealing. Recent Labs    06/11/23 1605 06/12/23 0552 06/13/23 0526 06/14/23 0543 06/15/23 1115 06/17/23 0019 06/17/23 2008 06/18/23 0419 06/19/23 0420 06/20/23 0332  HGB 9.4* 9.1* 8.9* 9.9* 10.5* 10.8* 11.1* 10.8* 10.4* 9.6*  -Continue monitoring  Hypokalemia/hypomagnesemia/hypophosphatemia -Monitor replenish as appropriate  Hypothyroidism: Repeat TSH normal.  Free T4 slightly elevated -Continue Synthroid -Recheck TFT in 4 to 6 weeks   Physical conditioning -PT/OT-recommended SNF  Advance care planning: DNR/DNI. -Palliative planning to meet family on 8/18   Moderate malnutrition Body mass index is 22.11 kg/m. Nutrition Problem: Moderate Malnutrition Etiology: acute illness Signs/Symptoms: energy intake < 75% for > 7 days, percent weight loss Percent  weight loss: 9.3 % (in less than 3 months) Interventions: Ensure Enlive (each supplement provides 350kcal and 20 grams of protein), Liberalize Diet    DVT prophylaxis:  Place TED hose Start: 06/15/23 1119 enoxaparin  (LOVENOX) injection 40 mg Start: 06/14/23 1000  Code Status: DNR/DNI Family Communication: None at bedside today. Level of care: Telemetry Medical Status is: Inpatient Remains inpatient appropriate because: Placement.   Final disposition: SNF.  Medically stable for discharge. Consultants:  Psychiatry Neurology Neuro-oncology Palliative medicine  35 minutes with more than 50% spent in reviewing records, counseling patient/family and coordinating care.   Sch Meds:  Scheduled Meds:  Chlorhexidine Gluconate Cloth  6 each Topical Daily   dexamethasone (DECADRON) injection  4 mg Intravenous Q12H   enoxaparin (LOVENOX) injection  40 mg Subcutaneous Q24H   feeding supplement  237 mL Oral BID BM   Gerhardt's butt cream   Topical BID   lacosamide  75 mg Oral BID   levothyroxine  50 mcg Oral Q0600   melatonin  3 mg Oral QHS   multivitamin with minerals  1 tablet Oral Daily   OLANZapine zydis  2.5 mg Oral Daily   OLANZapine zydis  5 mg Oral QHS   senna-docusate  1 tablet Oral QHS   sodium chloride  1 g Oral BID WC   tamsulosin  0.4 mg Oral QPC supper   Continuous Infusions:  potassium PHOSPHATE IVPB (in mmol) Stopped (06/17/23 2349)   PRN Meds:.haloperidol lactate, midazolam  Antimicrobials: Anti-infectives (From admission, onward)    Start     Dose/Rate Route Frequency Ordered Stop   06/14/23 2200  sulfamethoxazole-trimethoprim (BACTRIM DS) 800-160 MG per tablet 1 tablet  Status:  Discontinued        1 tablet Oral 2 times daily 06/14/23 1750 06/18/23 1209   06/14/23 1400  ceFEPIme (MAXIPIME) 2 g in sodium chloride 0.9 % 100 mL IVPB  Status:  Discontinued        2 g 200 mL/hr over 30 Minutes Intravenous Every 8 hours 06/14/23 1228 06/14/23 1749   06/12/23 1600  cefTRIAXone (ROCEPHIN) 2 g in sodium chloride 0.9 % 100 mL IVPB  Status:  Discontinued        2 g 200 mL/hr over 30 Minutes Intravenous Every 24 hours 06/11/23 2104 06/14/23 1228   06/11/23 1615  cefTRIAXone (ROCEPHIN)  1 g in sodium chloride 0.9 % 100 mL IVPB  Status:  Discontinued        1 g 200 mL/hr over 30 Minutes Intravenous Every 24 hours 06/11/23 1603 06/11/23 1608   06/11/23 1615  cefTRIAXone (ROCEPHIN) 2 g in sodium chloride 0.9 % 100 mL IVPB        2 g 200 mL/hr over 30 Minutes Intravenous  Once 06/11/23 1608 06/11/23 1741        I have personally reviewed the following labs and images: CBC: Recent Labs  Lab 06/14/23 0543 06/15/23 1115 06/17/23 0019 06/17/23 2008 06/18/23 0419 06/19/23 0420 06/20/23 0332  WBC 7.6 9.1 6.3 5.9 4.7 4.4 12.3*  NEUTROABS 5.5 6.3  --   --   --   --   --   HGB 9.9* 10.5* 10.8* 11.1* 10.8* 10.4* 9.6*  HCT 31.2* 34.1* 34.0* 35.8* 33.4* 32.1* 29.8*  MCV 97.2 98.3 93.9 96.0 93.0 93.3 92.3  PLT 128* 160 198 193 224 245 267   BMP &GFR Recent Labs  Lab 06/15/23 1115 06/16/23 1032 06/17/23 0019 06/17/23 2008 06/18/23 0419 06/19/23 0420 06/20/23 0332  NA 138 139  137 137 137 134* 134*  K 3.5 3.8 3.9 4.1 4.2 4.9 4.5  CL 107 103 102 103 103 103 105  CO2 21* 26 27 19* 24 20* 22  GLUCOSE 150* 104* 84 97 91 133* 132*  BUN 18 13 9 11 12 16 21   CREATININE 0.98 0.96 1.12 1.10 1.02 1.02 0.85  CALCIUM 7.8* 8.1* 8.1* 7.9* 8.1* 8.0* 8.2*  MG 1.4* 2.1 1.7  --   --  1.6* 2.0  PHOS 2.9 2.3* 2.1*  --   --  2.9 2.9   Estimated Creatinine Clearance: 64 mL/min (by C-G formula based on SCr of 0.85 mg/dL). Liver & Pancreas: Recent Labs  Lab 06/14/23 0543 06/15/23 1115 06/16/23 1032 06/17/23 0019 06/17/23 2008 06/18/23 0419 06/19/23 0420 06/20/23 0332  AST 29 22 32  --  24 19  --   --   ALT 20 17 24   --  17 17  --   --   ALKPHOS 52 52 47  --  47 49  --   --   BILITOT 0.6 1.0 0.2*  --  0.8 0.7  --   --   PROT 4.9* 5.4* 5.2*  --  5.5* 5.4*  --   --   ALBUMIN 2.4* 2.7* 2.5* 2.5* 2.7* 2.6* 2.6* 2.6*   No results for input(s): "LIPASE", "AMYLASE" in the last 168 hours. Recent Labs  Lab 06/16/23 0241  AMMONIA 37*   Diabetic: No results for input(s):  "HGBA1C" in the last 72 hours. Recent Labs  Lab 06/14/23 0732 06/15/23 1107 06/16/23 2105 06/17/23 1912 06/18/23 1148  GLUCAP 63* 150* 102* 111* 118*   Cardiac Enzymes: Recent Labs  Lab 06/16/23 1032  CKTOTAL 19*   No results for input(s): "PROBNP" in the last 8760 hours. Coagulation Profile: No results for input(s): "INR", "PROTIME" in the last 168 hours.  Thyroid Function Tests: Recent Labs    06/18/23 1500  TSH 0.536   Lipid Profile: No results for input(s): "CHOL", "HDL", "LDLCALC", "TRIG", "CHOLHDL", "LDLDIRECT" in the last 72 hours. Anemia Panel: No results for input(s): "VITAMINB12", "FOLATE", "FERRITIN", "TIBC", "IRON", "RETICCTPCT" in the last 72 hours. Urine analysis:    Component Value Date/Time   COLORURINE YELLOW 06/11/2023 1814   APPEARANCEUR CLEAR 06/11/2023 1814   LABSPEC 1.010 06/11/2023 1814   PHURINE 5.0 06/11/2023 1814   GLUCOSEU NEGATIVE 06/11/2023 1814   HGBUR LARGE (A) 06/11/2023 1814   BILIRUBINUR NEGATIVE 06/11/2023 1814   KETONESUR NEGATIVE 06/11/2023 1814   PROTEINUR NEGATIVE 06/11/2023 1814   NITRITE POSITIVE (A) 06/11/2023 1814   LEUKOCYTESUR MODERATE (A) 06/11/2023 1814   Sepsis Labs: Invalid input(s): "PROCALCITONIN", "LACTICIDVEN"  Microbiology: Recent Results (from the past 240 hour(s))  Resp panel by RT-PCR (RSV, Flu A&B, Covid) Anterior Nasal Swab     Status: None   Collection Time: 06/11/23  4:05 PM   Specimen: Anterior Nasal Swab  Result Value Ref Range Status   SARS Coronavirus 2 by RT PCR NEGATIVE NEGATIVE Final    Comment: (NOTE) SARS-CoV-2 target nucleic acids are NOT DETECTED.  The SARS-CoV-2 RNA is generally detectable in upper respiratory specimens during the acute phase of infection. The lowest concentration of SARS-CoV-2 viral copies this assay can detect is 138 copies/mL. A negative result does not preclude SARS-Cov-2 infection and should not be used as the sole basis for treatment or other patient  management decisions. A negative result may occur with  improper specimen collection/handling, submission of specimen other than nasopharyngeal swab, presence of viral mutation(s)  within the areas targeted by this assay, and inadequate number of viral copies(<138 copies/mL). A negative result must be combined with clinical observations, patient history, and epidemiological information. The expected result is Negative.  Fact Sheet for Patients:  BloggerCourse.com  Fact Sheet for Healthcare Providers:  SeriousBroker.it  This test is no t yet approved or cleared by the [place] FDA and  has been authorized for detection and/or diagnosis of SARS-CoV-2 by FDA under an Emergency Use Authorization (EUA). This EUA will remain  in effect (meaning this test can be used) for the duration of the COVID-19 declaration under Section 564(b)(1) of the Act, 21 U.S.C.section 360bbb-3(b)(1), unless the authorization is terminated  or revoked sooner.       Influenza A by PCR NEGATIVE NEGATIVE Final   Influenza B by PCR NEGATIVE NEGATIVE Final    Comment: (NOTE) The Xpert Xpress SARS-CoV-2/FLU/RSV plus assay is intended as an aid in the diagnosis of influenza from Nasopharyngeal swab specimens and should not be used as a sole basis for treatment. Nasal washings and aspirates are unacceptable for Xpert Xpress SARS-CoV-2/FLU/RSV testing.  Fact Sheet for Patients: BloggerCourse.com  Fact Sheet for Healthcare Providers: SeriousBroker.it  This test is not yet approved or cleared by the [place] FDA and has been authorized for detection and/or diagnosis of SARS-CoV-2 by FDA under an Emergency Use Authorization (EUA). This EUA will remain in effect (meaning this test can be used) for the duration of the COVID-19 declaration under Section 564(b)(1) of the Act, 21 U.S.C. section 360bbb-3(b)(1),  unless the authorization is terminated or revoked.     Resp Syncytial Virus by PCR NEGATIVE NEGATIVE Final    Comment: (NOTE) Fact Sheet for Patients: BloggerCourse.com  Fact Sheet for Healthcare Providers: SeriousBroker.it  This test is not yet approved or cleared by the [place] FDA and has been authorized for detection and/or diagnosis of SARS-CoV-2 by FDA under an Emergency Use Authorization (EUA). This EUA will remain in effect (meaning this test can be used) for the duration of the COVID-19 declaration under Section 564(b)(1) of the Act, 21 U.S.C. section 360bbb-3(b)(1), unless the authorization is terminated or revoked.  Performed at [facility], [address], [place], [place] [address]   Blood Culture (routine x 2)     Status: Abnormal   Collection Time: 06/11/23  4:05 PM   Specimen: BLOOD  Result Value Ref Range Status   Specimen Description   Final    BLOOD SITE NOT SPECIFIED Performed at [facility], [address] [place], [place] [address]    Special Requests   Final    BOTTLES DRAWN AEROBIC AND ANAEROBIC Blood Culture adequate volume Performed at [facility], [address], , [place] [address]    Culture  Setup Time   Final    GRAM NEGATIVE RODS IN BOTH AEROBIC AND ANAEROBIC BOTTLES CRITICAL RESULT CALLED TO, READ BACK BY AND VERIFIED WITH: PHARMD ANH PHAM [phone] [id number] BY Berline Chough, [place] Performed at [facility], [address] [place], [place] [address]    Culture CITROBACTER FREUNDII (A)  Final   Report Status 06/14/2023 FINAL  Final   Organism ID, Bacteria CITROBACTER FREUNDII  Final      Susceptibility   Citrobacter freundii - MIC*    CEFEPIME <=0.12 SENSITIVE Sensitive     CEFTAZIDIME <=1 SENSITIVE Sensitive     CEFTRIAXONE <=0.25 SENSITIVE Sensitive     CIPROFLOXACIN <=0.25 SENSITIVE Sensitive  GENTAMICIN <=1  SENSITIVE Sensitive     IMIPENEM 0.5 SENSITIVE Sensitive     TRIMETH/SULFA <=20 SENSITIVE Sensitive     PIP/TAZO <=4 SENSITIVE Sensitive     * CITROBACTER FREUNDII  Blood Culture ID Panel (Reflexed)     Status: Abnormal   Collection Time: 06/11/23  4:05 PM  Result Value Ref Range Status   Enterococcus faecalis NOT DETECTED NOT DETECTED Final   Enterococcus Faecium NOT DETECTED NOT DETECTED Final   Listeria monocytogenes NOT DETECTED NOT DETECTED Final   Staphylococcus species NOT DETECTED NOT DETECTED Final   Staphylococcus aureus (BCID) NOT DETECTED NOT DETECTED Final   Staphylococcus epidermidis NOT DETECTED NOT DETECTED Final   Staphylococcus lugdunensis NOT DETECTED NOT DETECTED Final   Streptococcus species NOT DETECTED NOT DETECTED Final   Streptococcus agalactiae NOT DETECTED NOT DETECTED Final   Streptococcus pneumoniae NOT DETECTED NOT DETECTED Final   Streptococcus pyogenes NOT DETECTED NOT DETECTED Final   A.calcoaceticus-baumannii NOT DETECTED NOT DETECTED Final   Bacteroides fragilis NOT DETECTED NOT DETECTED Final   Enterobacterales DETECTED (A) NOT DETECTED Final    Comment: Enterobacterales represent a large order of gram negative bacteria, not a single organism. Refer to culture for further identification. CRITICAL RESULT CALLED TO, READ BACK BY AND VERIFIED WITH: PHARMD ANH PHAM [phone] [id number] BY J [id number], MT    Enterobacter cloacae complex NOT DETECTED NOT DETECTED Final   Escherichia coli NOT DETECTED NOT DETECTED Final   Klebsiella aerogenes NOT DETECTED NOT DETECTED Final   Klebsiella oxytoca NOT DETECTED NOT DETECTED Final   Klebsiella pneumoniae NOT DETECTED NOT DETECTED Final   Proteus species NOT DETECTED NOT DETECTED Final   Salmonella species NOT DETECTED NOT DETECTED Final   Serratia marcescens NOT DETECTED NOT DETECTED Final   Haemophilus influenzae NOT DETECTED NOT DETECTED Final   Neisseria meningitidis NOT DETECTED NOT DETECTED Final   Pseudomonas  aeruginosa NOT DETECTED NOT DETECTED Final   Stenotrophomonas maltophilia NOT DETECTED NOT DETECTED Final   Candida albicans NOT DETECTED NOT DETECTED Final   Candida auris NOT DETECTED NOT DETECTED Final   Candida glabrata NOT DETECTED NOT DETECTED Final   Candida krusei NOT DETECTED NOT DETECTED Final   Candida parapsilosis NOT DETECTED NOT DETECTED Final   Candida tropicalis NOT DETECTED NOT DETECTED Final   Cryptococcus neoformans/gattii NOT DETECTED NOT DETECTED Final   CTX-M ESBL NOT DETECTED NOT DETECTED Final   Carbapenem resistance IMP NOT DETECTED NOT DETECTED Final   Carbapenem resistance KPC NOT DETECTED NOT DETECTED Final   Carbapenem resistance NDM NOT DETECTED NOT DETECTED Final   Carbapenem resist OXA 48 LIKE NOT DETECTED NOT DETECTED Final   Carbapenem resistance VIM NOT DETECTED NOT DETECTED Final    Comment: Performed at [facility], [address], [place], [place] [address]  Blood Culture (routine x 2)     Status: Abnormal   Collection Time: 06/11/23  4:08 PM   Specimen: BLOOD LEFT FOREARM  Result Value Ref Range Status   Specimen Description   Final    BLOOD LEFT FOREARM Performed at [facility], [address] [place], [place] [address]    Special Requests   Final    BOTTLES DRAWN AEROBIC AND ANAEROBIC Blood Culture adequate volume Performed at [facility], [address], [place], [place] [address]    Culture  Setup Time   Final    GRAM NEGATIVE RODS IN BOTH AEROBIC AND ANAEROBIC BOTTLES CRITICAL VALUE NOTED.  VALUE IS  CONSISTENT WITH PREVIOUSLY REPORTED AND CALLED VALUE.    Culture (A)  Final    CITROBACTER FREUNDII SUSCEPTIBILITIES PERFORMED ON PREVIOUS CULTURE WITHIN THE LAST 5 DAYS. Performed at [facility], [address] [place], [place] [address]    Report Status 06/16/2023 FINAL  Final  Urine Culture     Status: Abnormal   Collection Time: 06/11/23  6:14 PM   Specimen: Urine, Random  Result Value Ref  Range Status   Specimen Description   Final    URINE, RANDOM Performed at [facility], [address] [place], [place] [address]    Special Requests   Final    NONE Reflexed from [id number] Performed at [facility], [address], [place], [place] [address]    Culture >=100,000 COLONIES/mL CITROBACTER FREUNDII (A)  Final   Report Status 06/13/2023 FINAL  Final   Organism ID, Bacteria CITROBACTER FREUNDII (A)  Final      Susceptibility   Citrobacter freundii - MIC*    CEFEPIME <=0.12 SENSITIVE Sensitive     CEFTRIAXONE <=0.25 SENSITIVE Sensitive     CIPROFLOXACIN <=0.25 SENSITIVE Sensitive     GENTAMICIN <=1 SENSITIVE Sensitive     IMIPENEM 1 SENSITIVE Sensitive     NITROFURANTOIN <=16 SENSITIVE Sensitive     TRIMETH/SULFA <=20 SENSITIVE Sensitive     PIP/TAZO <=4 SENSITIVE Sensitive     * >=100,000 COLONIES/mL CITROBACTER FREUNDII    Radiology Studies: No results found.    Rosanna Bickle T. Chin W[facility]  If 7PM-7AM, please contact night-coverage [email or web address] 06/20/2023, 2:24 PM

## 2023-06-21 DIAGNOSIS — A419 Sepsis, unspecified organism: Secondary | ICD-10-CM | POA: Diagnosis not present

## 2023-06-21 DIAGNOSIS — D329 Benign neoplasm of meninges, unspecified: Secondary | ICD-10-CM | POA: Diagnosis not present

## 2023-06-21 DIAGNOSIS — Z515 Encounter for palliative care: Secondary | ICD-10-CM | POA: Diagnosis not present

## 2023-06-21 DIAGNOSIS — E440 Moderate protein-calorie malnutrition: Secondary | ICD-10-CM | POA: Diagnosis not present

## 2023-06-21 DIAGNOSIS — Z7189 Other specified counseling: Secondary | ICD-10-CM | POA: Diagnosis not present

## 2023-06-21 MED ORDER — SENNOSIDES-DOCUSATE SODIUM 8.6-50 MG PO TABS: 1.0000 | ORAL_TABLET | Freq: Every day | ORAL | Status: AC

## 2023-06-21 MED ORDER — OLANZAPINE 5 MG PO TBDP: ORAL_TABLET | ORAL | 2 refills | Status: DC

## 2023-06-21 MED ORDER — DEXAMETHASONE 2 MG PO TABS: ORAL_TABLET | ORAL | 0 refills | Status: AC

## 2023-06-21 MED ORDER — TAMSULOSIN HCL 0.4 MG PO CAPS: 0.4000 mg | ORAL_CAPSULE | Freq: Every day | ORAL | 2 refills | Status: AC

## 2023-06-21 MED ORDER — LACOSAMIDE 50 MG PO TABS: 75.0000 mg | ORAL_TABLET | Freq: Two times a day (BID) | ORAL | 2 refills | Status: AC

## 2023-06-21 NOTE — Progress Notes (Signed)
   Palliative Medicine Inpatient Follow Up Note HPI: 87 year old M with PMH of seizure disorder, meningioma, chronic vasogenic edema, BPH and noncompliance with medication presenting with altered mental status and decreased p.o. intake, and admitted for sepsis due to urinary tract infection, acute metabolic encephalopathy with agitation and AKI.    Palliative care asked to get involved to further support goals of care conversations.   Today's Discussion 06/21/2023  *Please note that this is a verbal dictation therefore any spelling or grammatical errors are due to the "Dragon [employer] One" system interpretation.  Chart reviewed inclusive of vital signs, progress notes, laboratory results, and diagnostic images.   I spoke with Aaron Hickman's night RN, Aaron Hickman who shares that he had a restful night. He vocalized no significant concerns as of this morning.  I met with Aaron Hickman at bedside this morning. He is resting comfortably and lifts the sheet over his head when spoken to.   Per discussions with family plan will be transition home with [facility] hospice.   Questions and concerns addressed/Palliative Support Provided.   Objective Assessment: Vital Signs Vitals:   06/20/23 2329 06/21/23 0755  BP: (!) 150/68 (!) 110/34  Pulse: 74 84  Resp: 18 18  Temp:  97.7 F (36.5 C)  SpO2: 98% 99%    Intake/Output Summary (Last 24 hours) at 06/21/2023 0900 Last data filed at 06/21/2023 0200 Gross per 24 hour  Intake 240 ml  Output 1000 ml  Net -760 ml   Last Weight  Most recent update: 06/12/2023  9:11 AM    Weight  69.9 kg (154 lb 1.6 oz)            Gen: Elderly Caucasian M in NAD HEENT: moist mucous membranes CV: Regular rate and rhythm  PULM: On RA, breathing is even and nonlabored ABD: soft/nontender  EXT: No edema  Neuro: Oriented to self  SUMMARY OF RECOMMENDATIONS   DNAR/DNI  Goals are to transition home with hospice  Appreciate TOC making hospice referral to  [facility]  Ongoing PMT support  Billing based on MDM: Low ______________________________________________________________________________________ Aaron Hickman Palliative Medicine Team Team Cell Phone: [phone] Please utilize secure chat with additional questions, if there is no response within 30 minutes please call the above phone number  Palliative Medicine Team providers are available by phone from 7am to 7pm daily and can be reached through the team cell phone.  Should this patient require assistance outside of these hours, please call the patient's attending physician.

## 2023-06-21 NOTE — TOC Transition Note (Signed)
Transition of Care [facility]) - CM/SW Discharge Note   Patient Details  Name: Aaron Hickman MRN: [id number] Date of Birth: [birth year]  [facility] CM/SW Contact:  Kermit Balo, RN Phone Number: 06/21/2023, 10:27 AM   Clinical Narrative:    Patient is discharging home with hospice services though [facility] today. Shanita and Misty are aware of d/c home.  No new DME needs.  CM spoke to daughter, Efraim Kaufmann and she is aware of d/c.  They will transport him home via private vehicle.    Final next level of care: Home w Hospice Care Barriers to Discharge: No Barriers Identified   Patient Goals and CMS Choice CMS Medicare.gov Compare Post Acute Care list provided to:: Patient Represenative (must comment) Choice offered to / list presented to : Spouse, Adult Children  Discharge Placement                         Discharge Plan and Services Additional resources added to the After Visit Summary for     Discharge Planning Services: CM Consult Post Acute Care Choice: Home Health                    HH Arranged: RN, PT, OT, Nurse's Aide, Social Work [facility] Agency: [facility] Date HH Agency Contacted: 06/20/23 Time HH Agency Contacted: 1601 Representative spoke with at [facility] Agency: [name]  Social Determinants of Health (SDOH) Interventions SDOH Screenings   Food Insecurity: No Food Insecurity (06/17/2023)  Housing: Low Risk  (06/17/2023)  Transportation Needs: No Transportation Needs (06/17/2023)  Utilities: Not At Risk (06/17/2023)  Tobacco Use: Medium Risk (05/21/2023)     Readmission Risk Interventions    06/14/2023   11:42 AM  Readmission Risk Prevention Plan  Transportation Screening Complete  PCP or Specialist Appt within 3-5 Days Complete  HRI or Home Care Consult Complete  Social Work Consult for Recovery Care Planning/Counseling Complete  Palliative Care Screening Not Applicable  Medication Review [employer])  Complete

## 2023-06-21 NOTE — Discharge Summary (Signed)
Physician Discharge Summary  Aaron Hickman:[id number] DOB: 03-Nov-1935 DOA: 06/11/2023  PCP: [facility]  Admit date: 06/11/2023 Discharge date: 06/21/2023 Admitted From: Home   Disposition:  Home with home hospice Recommendations for Outpatient Follow-up:  Follow up with PCP  in 1 to 2 weeks or sooner if needed Outpatient follow-up with neurology, neuro-oncology and neurosurgery if needed Check CBC and CMP in 1 week Please follow up on the following pending results: None  Home Health: Home hospice Equipment/Devices: Hospice to arrange  Discharge Condition: Stable CODE STATUS: DNR/DNI   Hospital course 87 year old M with PMH of seizure disorder, meningioma, chronic vasogenic edema, BPH and noncompliance with medication presenting with altered mental status and decreased p.o. intake, and admitted for sepsis due to urinary tract infection, acute metabolic encephalopathy with agitation and AKI.  He was febrile 201 and tachycardic.  Had leukocytosis to 28.3.  UA concerning for UTI.  Cultures obtained.  Started on IV ceftriaxone and IV fluid.  Later, urine and blood culture with pansensitive Citrobacter fruendii.  Initially, antibiotic escalated to cefepime and then de-escalated to Bactrim.   Patient has some behavioral issues and agitation for which she was started on Haldol and Zyprexa.  Psychiatry consulted and adjusted his Zyprexa and recommended neurology consultation due to concern about his Keppra contributing to behavioral disturbance.  Neurology recommended transfer to [facility] and changed his antiepileptics and started continuous EEG.   Neurology changed Keppra to Depakote and then to Vimpat due to elevated ammonia with Depakote.  Continuous EEG with subclinical seizure on 8/14 and no seizure on 8/15. EEG discontinued.  MRI brain with without contrast showed mildly increased size of previously treated right middle cranial fossa meningioma, similar exuberant  surrounding edema, similar putative 1.2 cm right temporoparietal meningioma and similar right MCA bifurcation aneurysm.  Neurology recommended continuing Vimpat, seizure precaution and outpatient follow-up with neurology and neurosurgery.    Started on Decadron 4 mg twice daily on 8/16.  Confusion and agitation improved.  CODE STATUS changed to DNR/DNI after discussion with family.  Discharged home with home hospice on Decadron taper, Vimpat, and Zyprexa.  See individual problem list below for more.   Problems addressed during this hospitalization Principal Problem:   Sepsis secondary to UTI [facility]) Active Problems:   Meningioma (HCC)   Seizure (HCC)   Metabolic acidosis, normal anion gap (NAG)   Acute renal failure (ARF) (HCC)   Bilateral hydronephrosis   Acute metabolic encephalopathy   Goals of care, counseling/discussion   DNR (do not resuscitate)   Pericardial effusion   Malnutrition of moderate degree   Severe sepsis due to Citrobacter freundii UTI and bacteremia: POA.  Has fever, leukocytosis, tachycardia with AKI and mental status change on presentation.  Culture data as above.   -Ceftriaxone >> cefepime >> Bactrim for a total of 7 days.  Completed on 8/16 -Continue Flomax.  Passed voiding trial on 8/17.   Acute metabolic encephalopathy/agitation: Multifactorial including sepsis, delirium, diminished hearing, subclinical seizure and Keppra.  LTM EEG with subclinical seizure on 8/14, and no seizure on 8/15. MRI brain showed mildly increased size of previously treated right middle cranial fossa meningioma, similar exuberant surrounding edema, similar putative 1.2 cm right temporoparietal meningioma and similar right MCA bifurcation aneurysm.  Improved.  Speech more intelligible.  Oriented to self and place.  Follows commands better. -Neurology recs -Continue Vimpat, seizure precaution and outpatient f/p with neurology and neurosurgery.  -Started Decadron 4 mg twice daily on 8/16 with  improvement.  Discharged on Decadron slow taper -Continue Zyprexa per psych. -Discharge home with home hospice.   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 Known lesion abutting the right cavernous sinus is poorly visualized due to CT technique.  Radiology recommended considering MRI for definitive characterization.  Previous attending discussed with neuro-oncology, Dr. Asher Muir who felt CT finding to be chronic and recommended no further imaging.  MRI brain as above. -Decadron as above. -Outpatient follow-up with neurosurgery   Recurrent hypotension: Unclear etiology but responds to fluid.  Likely dehydration.  Cortisol and TSH normal.  TTE with "moderate pericardial effusion without tamponade".  TTE finding discussed with cardiology, Dr. Graciela Husbands and felt to be trivial to have hemodynamic effect.  Blood pressure stable off IV fluid and midodrine.   Moderate pericardial effusion? No tamponade on TTE.  Discussed with cardiology, Dr. Graciela Husbands.  Effusion felt to be trivial to have hemodynamic effect.  Uric acid, CRP and ESR within normal.   Seizure disorder: No clinical seizure noted.  EEG with subclinical seizure -Management as above   Bilateral Hydronephrosis/possible BOO: Likely due to to enlarged prostate.  CT abdomen and pelvis with mild bilateral hydronephrosis.  Foley catheter placed in ED. Passed voiding trial on 8/17. -Continue Flomax.   AKI/metabolic acidosis: Resolved.   Normocytic anemia/thrombocytopenia: Stable.  Anemia panel unrevealing.   Hypokalemia/hypomagnesemia/hypophosphatemia: Resolved   Hypothyroidism: Repeat TSH normal.  Free T4 slightly elevated -Continue Synthroid -Recheck TFT in 4 to 6 weeks   Physical conditioning -PT/OT-recommended SNF but patient going home with home hospice.   Advance care  planning: DNR/DNI.  Palliative medicine involved. -Going home with home hospice.  Moderate malnutrition Nutrition Problem: Moderate Malnutrition Etiology: acute illness Signs/Symptoms: energy intake < 75% for > 7 days, percent weight loss Percent weight loss: 9.3 % (in less than 3 months) Interventions: Ensure Enlive (each supplement provides 350kcal and 20 grams of protein), Liberalize Diet     Time spent 45 minutes  Vital signs Vitals:   06/20/23 2002 06/20/23 2329 06/21/23 0300 06/21/23 0755  BP: (!) 151/77 (!) 150/68  (!) 110/34  Pulse: 84 74  84  Temp: 97.8 F (36.6 C) 97.7 F (36.5 C) Comment: Patient refused vitals 97.7 F (36.5 C)  Resp: 18 18  18   Height:      Weight:      SpO2: 100% 98%  99%  TempSrc: Oral Axillary  Oral  BMI (Calculated):         Discharge exam  GENERAL: No apparent distress.  Nontoxic. HEENT: MMM.  Vision grossly tact.  Significantly impaired hearing. NECK: Supple.  No apparent JVD.  RESP:  No IWOB.  Fair aeration bilaterally. CVS:  RRR. Heart sounds normal.  ABD/GI/GU: BS+. Abd soft, NTND.  MSK/EXT:  Moves extremities. No apparent deformity. No edema.  SKIN: no apparent skin lesion or wound NEURO: Sleepy but wakes to voice.  Oriented to self but not place.  Follows some commands.  Has significantly impaired hearing.  No apparent focal neurodeficit. PSYCH: Calm. Normal affect.   Discharge Instructions Discharge Instructions     Diet general   Complete by: As directed    Increase activity slowly   Complete by: As directed       Allergies as of 06/21/2023       Reactions   Iodinated Contrast Media Itching   Tamsulosin Other (See  Comments)   Upsets stomach   Valproic Acid And Related    Hyperammonemia        Medication List     STOP taking these medications    levETIRAcetam 500 MG tablet Commonly known as: KEPPRA       TAKE these medications    dexamethasone 2 MG tablet Commonly known as: DECADRON Take 2 tablets  (4 mg total) by mouth 2 (two) times daily with a meal for 7 days, THEN 2 tablets (4 mg total) daily for 14 days, THEN 1 tablet (2 mg total) daily for 7 days. Start taking on: June 21, 2023   lacosamide 50 MG Tabs tablet Commonly known as: VIMPAT Take 1.5 tablets (75 mg total) by mouth 2 (two) times daily.   levothyroxine 50 MCG tablet Commonly known as: SYNTHROID Take 1 tablet (50 mcg total) by mouth daily at 6 (six) AM.   multivitamin with minerals Tabs tablet Take 1 tablet by mouth daily.   OLANZapine zydis 5 MG disintegrating tablet Commonly known as: ZYPREXA Take 0.5 tablets (2.5 mg total) by mouth daily AND 1 tablet (5 mg total) at bedtime.   senna-docusate 8.6-50 MG tablet Commonly known as: Senokot-S Take 1 tablet by mouth at bedtime.   sodium chloride 1 g tablet Take 1 g by mouth 2 (two) times daily with a meal.   tamsulosin 0.4 MG Caps capsule Commonly known as: FLOMAX Take 1 capsule (0.4 mg total) by mouth daily after supper.        Consultations: Psychiatry Neurology Neuro-oncology Palliative medicine  Procedures/Studies: None   ECHOCARDIOGRAM COMPLETE  Result Date: 06/19/2023    ECHOCARDIOGRAM REPORT   Patient Name:   Aaron Hickman Date of Exam: 06/19/2023 Medical Rec #:  [id number]         Height:       70.0 in Accession #:    [id number]        Weight:       154.1 lb Date of Birth:  10-24-1936         BSA:          1.868 m Patient Age:    86 years          BP:           137/66 mmHg Patient Gender: M                 HR:           94 bpm. Exam Location:  Inpatient Procedure: 2D Echo, Color Doppler and Cardiac Doppler Indications:    Other abnormalities of the heart R00.8  History:        Patient has no prior history of Echocardiogram examinations.                 Risk Factors:Non-Smoker.  Sonographer:    Dondra Prader RVT RCS Referring Phys: [id number] Boyce Medici Onetta Spainhower  Sonographer Comments: Suboptimal subcostal window. Image acquisition challenging due to  respiratory motion. IMPRESSIONS  1. Left ventricular ejection fraction, by estimation, is 60 to 65%. The left ventricle has normal function. The left ventricle has no regional wall motion abnormalities. There is mild left ventricular hypertrophy. Left ventricular diastolic parameters are consistent with Grade I diastolic dysfunction (impaired relaxation).  2. Right ventricular systolic function was not well visualized. The right ventricular size is not well visualized.  3. Moderate pericardial effusion. The pericardial effusion is circumferential. There is no evidence of cardiac tamponade.  4. The mitral valve is  normal in structure. No evidence of mitral valve regurgitation. No evidence of mitral stenosis.  5. The tricuspid valve is abnormal.  6. The aortic valve is tricuspid. There is moderate calcification of the aortic valve. There is moderate thickening of the aortic valve. Aortic valve regurgitation is mild. Mild aortic valve stenosis.  7. The inferior vena cava is normal in size with greater than 50% respiratory variability, suggesting right atrial pressure of 3 mmHg. FINDINGS  Left Ventricle: Left ventricular ejection fraction, by estimation, is 60 to 65%. The left ventricle has normal function. The left ventricle has no regional wall motion abnormalities. The left ventricular internal cavity size was normal in size. There is  mild left ventricular hypertrophy. Left ventricular diastolic parameters are consistent with Grade I diastolic dysfunction (impaired relaxation). Normal left ventricular filling pressure. Right Ventricle: The right ventricular size is not well visualized. Right vetricular wall thickness was not well visualized. Right ventricular systolic function was not well visualized. Left Atrium: Left atrial size was normal in size. Right Atrium: Right atrial size was normal in size. Pericardium: A moderately sized pericardial effusion is present. The pericardial effusion is circumferential. There  is no evidence of cardiac tamponade. Mitral Valve: The mitral valve is normal in structure. No evidence of mitral valve regurgitation. No evidence of mitral valve stenosis. Tricuspid Valve: The tricuspid valve is abnormal. Tricuspid valve regurgitation is mild . No evidence of tricuspid stenosis. Aortic Valve: The aortic valve is tricuspid. There is moderate calcification of the aortic valve. There is moderate thickening of the aortic valve. There is moderate aortic valve annular calcification. Aortic valve regurgitation is mild. Aortic regurgitation PHT measures 484 msec. Mild aortic stenosis is present. Aortic valve mean gradient measures 10.0 mmHg. Aortic valve peak gradient measures 16.3 mmHg. Aortic valve area, by VTI measures 1.92 cm. Pulmonic Valve: The pulmonic valve was not well visualized. Pulmonic valve regurgitation is not visualized. No evidence of pulmonic stenosis. Aorta: The aortic root and ascending aorta are structurally normal, with no evidence of dilitation. Venous: The inferior vena cava is normal in size with greater than 50% respiratory variability, suggesting right atrial pressure of 3 mmHg. IAS/Shunts: The interatrial septum was not well visualized.  LEFT VENTRICLE PLAX 2D LVIDd:         4.05 cm   Diastology LVIDs:         2.90 cm   LV e' medial:    0.06 cm/s LV PW:         1.20 cm   LV E/e' medial:  11.4 LV IVS:        1.15 cm   LV e' lateral:   0.06 cm/s LVOT diam:     2.00 cm   LV E/e' lateral: 11.0 LV SV:         71 LV SV Index:   38 LVOT Area:     3.14 cm  IVC IVC diam: 1.50 cm LEFT ATRIUM             Index LA diam:        4.20 cm 2.25 cm/m LA Vol (A2C):   42.4 ml 22.69 ml/m LA Vol (A4C):   48.0 ml 25.66 ml/m LA Biplane Vol: 37.8 ml 20.23 ml/m  AORTIC VALVE                     PULMONIC VALVE AV Area (Vmax):    1.87 cm      PV Vmax:  0.77 m/s AV Area (Vmean):   1.90 cm      PV Peak grad:  2.4 mmHg AV Area (VTI):     1.92 cm AV Vmax:           202.00 cm/s AV Vmean:           134.000 cm/s AV VTI:            0.372 m AV Peak Grad:      16.3 mmHg AV Mean Grad:      10.0 mmHg LVOT Vmax:         120.00 cm/s LVOT Vmean:        81.200 cm/s LVOT VTI:          0.227 m LVOT/AV VTI ratio: 0.61 AI PHT:            484 msec  AORTA Ao Root diam: 3.20 cm Ao Asc diam:  3.40 cm MITRAL VALVE                TRICUSPID VALVE MV Area (PHT): 3.42 cm     TR Peak grad:   17.6 mmHg MV Decel Time: 222 msec     TR Vmax:        210.00 cm/s MV E velocity: 0.66 cm/s MV A velocity: 110.00 cm/s  SHUNTS MV E/A ratio:  0.01         Systemic VTI:  0.23 m                             Systemic Diam: 2.00 cm Dina Rich MD Electronically signed by Dina Rich MD Signature Date/Time: 06/19/2023/1:25:59 PM    Final    MR BRAIN W WO CONTRAST  Result Date: 06/17/2023 CLINICAL DATA:  Brain/CNS neoplasm, monitor EXAM: MRI HEAD WITHOUT AND WITH CONTRAST TECHNIQUE: Multiplanar, multiecho pulse sequences of the brain and surrounding structures were obtained without and with intravenous contrast. CONTRAST:  7mL GADAVIST GADOBUTROL 1 MMOL/ML IV SOLN COMPARISON:  MRI head March 3, 23. FINDINGS: Brain: Similar size of a 1.2 cm extra-axial mass along the right temporoparietal lobe. Similar versus mildly increased size of a 2.3 by 2.8 cm right parasellar mass sub which extends to involve the right cavernous sinus medially, right middle cranial fossa anteriorly and Meckel's cave posteriorly. Focus of nodular enhancement more medially is similar. Similar exuberant surrounding edema involving the right temporal lobe and right frontal lobe. Similar mild leftward midline shift. No evidence of acute infarct, acute hemorrhage or hydrocephalus. Vascular: Redemonstrated right MCA bifurcation aneurysm. Skull and upper cervical spine: Normal marrow signal. Sinuses/Orbits: Clear sinuses.  No acute orbital findings. Other: No mastoid effusions. IMPRESSION: 1. Similar versus mildly increased size of the previously treated right middle cranial  fossa meningioma with similar adjacent subcentimeter nodular focus of enhancement. Similar exuberant surrounding edema 2. Similar putative 1.2 cm right temporoparietal meningioma 3. Redemonstrated right MCA bifurcation aneurysm, not well assessed on this study. Electronically Signed   By: Feliberto Harts M.D.   On: 06/17/2023 18:42   Overnight EEG with video  Result Date: 06/16/2023 Charlsie Quest, MD     06/17/2023  9:55 AM Patient Name: Aaron Hickman MRN: [id number] Epilepsy Attending: Charlsie Quest Referring Physician/Provider: Erick Blinks, MD Duration: 06/16/2023 0005 to 06/17/2023 0005 Patient history: 87 year old Caucasian male with past medical history significant for but limited to medial sphenoid meningioma status postradiation, chronic vasogenic edema who has been noncompliant with steroids, seizures, BPH  as well as other comorbidities who presented with altered mental status. EEG to evaluate for seizure Level of alertness: Awake, asleep AEDs during EEG study: LEV, VPA Technical aspects: This EEG study was done with scalp electrodes positioned according to the 10-20 International system of electrode placement. Electrical activity was reviewed with band pass filter of 1-70Hz , sensitivity of 7 uV/mm, display speed of 64mm/sec with a 60Hz  notched filter applied as appropriate. EEG data were recorded continuously and digitally stored.  Video monitoring was available and reviewed as appropriate. Description: The posterior dominant rhythm consists of 8 Hz activity of moderate voltage (25-35 uV) seen predominantly in posterior head regions, symmetric and reactive to eye opening and eye closing. Sleep was characterized by vertex waves, sleep spindles (12 to 14 Hz), maximal frontocentral region. EEG showed intermittent 3 to 6 Hz theta-delta slowing in right temporo-parietal region. Spikes were noted in right frontal- anterior temporal region. Seizure without clinical signs was noted arising from  right frontal- anterior temporal region. EEG showed rhythmic sharply contoured 5Hz  theta slowing in right frontal- anterior temporal region which then involved all of right hemisphere as well as vertex region. EEG then evolved into sharply contoured high amplitude 3-5Hz  theta-delta slowing. EEG then involved left frontal region. Onset of seizure on 06/16/2023 at 0151, lasting 1 minute 24 seconds. Hyperventilation and photic stimulation were not performed.   ABNORMALITY - Seizure without clinical signs, right frontal- anterior temporal region. - Spike, right frontal- anterior temporal region - Intermittent slow, right temporo-parietal region. IMPRESSION: This study showed one seizure without clinical signs arising from right frontal-anterior temporal region on 06/16/2023 at 0151 lasting about 1 minute 24 seconds.  Additionally there was evidence of epileptogenicity arising from right frontal-anterior temporal region.  Lastly the study was suggestive of cortical dysfunction arising from right temporo-parietal region, likely secondary to underlying structural abnormality. Priyanka Annabelle Harman   US RENAL  Result Date: 06/15/2023 CLINICAL DATA:  Follow-up hydronephrosis EXAM: RENAL / URINARY TRACT ULTRASOUND COMPLETE COMPARISON:  06/11/2023 CT FINDINGS: Right Kidney: Renal measurements: 10.3 x 4.5 x 6.4 cm. = volume: 155 mL. Echogenicity within normal limits. No mass or hydronephrosis visualized. Left Kidney: Renal measurements: 10.4 x 5.3 x 5.6 cm. = volume: 162 mL. Echogenicity within normal limits. No mass or hydronephrosis visualized. Bladder: Decompressed by Foley catheter. Other: None. IMPRESSION: Previously seen hydronephrosis has resolved following placement of Foley catheter. Changes were likely related to a distended bladder. No other focal abnormality is noted. Electronically Signed   By: Alcide Clever M.D.   On: 06/15/2023 20:41   EEG adult  Result Date: 06/15/2023 Charlsie Quest, MD     06/15/2023  2:19  PM Patient Name: Aaron Hickman MRN: [id number] Epilepsy Attending: Charlsie Quest Referring Physician/Provider: Merlene Laughter, DO Date: 06/15/2023 Duration: 23.38 mins Patient history: 86 year old Caucasian male with past medical history significant for but limited to medial sphenoid meningioma status postradiation, chronic vasogenic edema who has been noncompliant with steroids, seizures, BPH as well as other comorbidities who presented with altered mental status. EEG to evaluate for seizure Level of alertness: Awake, asleep AEDs during EEG study: LEV Technical aspects: This EEG study was done with scalp electrodes positioned according to the 10-20 International system of electrode placement. Electrical activity was reviewed with band pass filter of 1-70Hz , sensitivity of 7 uV/mm, display speed of 74mm/sec with a 60Hz  notched filter applied as appropriate. EEG data were recorded continuously and digitally stored.  Video monitoring was available and reviewed as appropriate.  Description: The posterior dominant rhythm consists of 8 Hz activity of moderate voltage (25-35 uV) seen predominantly in posterior head regions, symmetric and reactive to eye opening and eye closing. Sleep was characterized by vertex waves, sleep spindles (12 to 14 Hz), maximal frontocentral region.  EEG showed intermittent 3 to 6 Hz theta-delta slowing in right temporo-parietal region. Hyperventilation and photic stimulation were not performed.   ABNORMALITY - Intermittent slow, right temporo-parietal region. IMPRESSION: This study is suggestive of cortical dysfunction arising from right temporo-parietal region, likely secondary to underlying structural abnormality. No seizures or epileptiform discharges were seen throughout the recording. Priyanka Annabelle Harman   CT ABDOMEN PELVIS WO CONTRAST  Result Date: 06/11/2023 CLINICAL DATA:  Sepsis. EXAM: CT ABDOMEN AND PELVIS WITHOUT CONTRAST TECHNIQUE: Multidetector CT imaging of the abdomen  and pelvis was performed following the standard protocol without IV contrast. RADIATION DOSE REDUCTION: This exam was performed according to the departmental dose-optimization program which includes automated exposure control, adjustment of the mA and/or kV according to patient size and/or use of iterative reconstruction technique. COMPARISON:  CT dated 03/29/2023. FINDINGS: Evaluation of this exam is limited in the absence of intravenous contrast. Lower chest: Minimal bibasilar dependent atelectasis. There is coronary vascular calcification. No intra-abdominal free air or free fluid. Hepatobiliary: The liver is unremarkable. No BD dilatation. The gallbladder is unremarkable. Pancreas: The distal body and tail of the pancreas are atrophic. There is a focus of coarse calcification in the head and uncinate process of the pancreas similar to prior CT, likely sequela of chronic pancreatitis. No active inflammatory changes. Spleen: Normal in size without focal abnormality. Adrenals/Urinary Tract: The adrenal glands are unremarkable. There is mild bilateral hydronephrosis, new since the prior CT. No stone. There is diffuse thickened and trabeculated appearance of the bladder wall indicative of chronic bladder outlet obstruction. Correlation with urinalysis recommended to exclude superimposed UTI. Stomach/Bowel: Mild sigmoid diverticulosis without active inflammatory changes. There is no bowel obstruction or active inflammation. The appendix is normal. Vascular/Lymphatic: Moderate aortoiliac atherosclerotic disease. The IVC is unremarkable. No portal venous gas. There is no adenopathy. Reproductive: Enlarged prostate gland with median lobe hypertrophy protruding through the base of the bladder. Other: None Musculoskeletal: Degenerative changes.  No acute osseous pathology. IMPRESSION: 1. Mild bilateral hydronephrosis, new since the prior CT, likely related to chronic bladder outlet obstruction. No stone. 2. Enlarged  prostate gland with findings of chronic bladder outlet obstruction. 3. Mild sigmoid diverticulosis. No bowel obstruction. Normal appendix. 4.  Aortic Atherosclerosis (ICD10-I70.0). Electronically Signed   By: Elgie Collard M.D.   On: 06/11/2023 18:56   CT Head Wo Contrast  Result Date: 06/11/2023 CLINICAL DATA:  Mental status change, unknown cause EXAM: CT HEAD WITHOUT CONTRAST TECHNIQUE: Contiguous axial images were obtained from the base of the skull through the vertex without intravenous contrast. RADIATION DOSE REDUCTION: This exam was performed according to the departmental dose-optimization program which includes automated exposure control, adjustment of the mA and/or kV according to patient size and/or use of iterative reconstruction technique. COMPARISON:  CT head 05/17/23, Brain MRI 01/02/22 FINDINGS: Brain: No hemorrhage. No extra-axial fluid collection. There is a peripheral 1.3 x 0.7 cm hyperdense lesion along the right cerebral convexity (series 2, image 20). This was likely present on prior imaging. This was seen on prior brain MRI dated 01/02/2022, and favored to represent a small meningioma. Redemonstrated vasogenic edema in the anterior right temporal lobe, and the right insula. Known lesion abutting the right cavernous sinus is poorly visualized due to  CT technique. No hemorrhage. No extra-axial fluid collection. No CT evidence of an acute cortical infarct. No midline shift. Vascular: No hyperdense vessel or unexpected calcification. Skull: Normal. Negative for fracture or focal lesion. Sinuses/Orbits: no middle ear or mastoid effusion. Paranasal sinuses are clear. Bilateral lens replacement. Orbits are otherwise unremarkable. Other: None. IMPRESSION: 1. No acute intracranial abnormality. 2. Redemonstrated vasogenic edema in the anterior right temporal lobe, and the right insula. Known lesion abutting the right cavernous sinus is poorly visualized due to CT technique. Consider further evaluation  with contrast-enhanced brain MRI more definitive characterization. Electronically Signed   By: Lorenza Cambridge M.D.   On: 06/11/2023 18:07   DG Chest Port 1 View  Result Date: 06/11/2023 CLINICAL DATA:  Altered mental status. EXAM: PORTABLE CHEST 1 VIEW COMPARISON:  May 17, 2023. FINDINGS: The heart size and mediastinal contours are within normal limits. Hypoinflation of the lungs with minimal bibasilar subsegmental atelectasis. The visualized skeletal structures are unremarkable. IMPRESSION: Hypoinflation of the lungs with minimal bibasilar subsegmental atelectasis. Electronically Signed   By: Lupita Raider M.D.   On: 06/11/2023 16:39       The results of significant diagnostics from this hospitalization (including imaging, microbiology, ancillary and laboratory) are listed below for reference.     Microbiology: Recent Results (from the past 240 hour(s))  Resp panel by RT-PCR (RSV, Flu A&B, Covid) Anterior Nasal Swab     Status: None   Collection Time: 06/11/23  4:05 PM   Specimen: Anterior Nasal Swab  Result Value Ref Range Status   SARS Coronavirus 2 by RT PCR NEGATIVE NEGATIVE Final    Comment: (NOTE) SARS-CoV-2 target nucleic acids are NOT DETECTED.  The SARS-CoV-2 RNA is generally detectable in upper respiratory specimens during the acute phase of infection. The lowest concentration of SARS-CoV-2 viral copies this assay can detect is 138 copies/mL. A negative result does not preclude SARS-Cov-2 infection and should not be used as the sole basis for treatment or other patient management decisions. A negative result may occur with  improper specimen collection/handling, submission of specimen other than nasopharyngeal swab, presence of viral mutation(s) within the areas targeted by this assay, and inadequate number of viral copies(<138 copies/mL). A negative result must be combined with clinical observations, patient history, and epidemiological information. The expected result  is Negative.  Fact Sheet for Patients:  BloggerCourse.com  Fact Sheet for Healthcare Providers:  SeriousBroker.it  This test is no t yet approved or cleared by the [place] FDA and  has been authorized for detection and/or diagnosis of SARS-CoV-2 by FDA under an Emergency Use Authorization (EUA). This EUA will remain  in effect (meaning this test can be used) for the duration of the COVID-19 declaration under Section 564(b)(1) of the Act, 21 U.S.C.section 360bbb-3(b)(1), unless the authorization is terminated  or revoked sooner.       Influenza A by PCR NEGATIVE NEGATIVE Final   Influenza B by PCR NEGATIVE NEGATIVE Final    Comment: (NOTE) The Xpert Xpress SARS-CoV-2/FLU/RSV plus assay is intended as an aid in the diagnosis of influenza from Nasopharyngeal swab specimens and should not be used as a sole basis for treatment. Nasal washings and aspirates are unacceptable for Xpert Xpress SARS-CoV-2/FLU/RSV testing.  Fact Sheet for Patients: BloggerCourse.com  Fact Sheet for Healthcare Providers: SeriousBroker.it  This test is not yet approved or cleared by the [place] FDA and has been authorized for detection and/or diagnosis of SARS-CoV-2 by FDA under an Emergency Use Authorization (EUA).  This EUA will remain in effect (meaning this test can be used) for the duration of the COVID-19 declaration under Section 564(b)(1) of the Act, 21 U.S.C. section 360bbb-3(b)(1), unless the authorization is terminated or revoked.     Resp Syncytial Virus by PCR NEGATIVE NEGATIVE Final    Comment: (NOTE) Fact Sheet for Patients: BloggerCourse.com  Fact Sheet for Healthcare Providers: SeriousBroker.it  This test is not yet approved or cleared by the [place] FDA and has been authorized for detection and/or diagnosis of  SARS-CoV-2 by FDA under an Emergency Use Authorization (EUA). This EUA will remain in effect (meaning this test can be used) for the duration of the COVID-19 declaration under Section 564(b)(1) of the Act, 21 U.S.C. section 360bbb-3(b)(1), unless the authorization is terminated or revoked.  Performed at [facility], [address], [place], [place] [address]   Blood Culture (routine x 2)     Status: Abnormal   Collection Time: 06/11/23  4:05 PM   Specimen: BLOOD  Result Value Ref Range Status   Specimen Description   Final    BLOOD SITE NOT SPECIFIED Performed at [facility], [address] [place], [place] [address]    Special Requests   Final    BOTTLES DRAWN AEROBIC AND ANAEROBIC Blood Culture adequate volume Performed at [facility], [address], [place], [place] [address]    Culture  Setup Time   Final    GRAM NEGATIVE RODS IN BOTH AEROBIC AND ANAEROBIC BOTTLES CRITICAL RESULT CALLED TO, READ BACK BY AND VERIFIED WITH: PHARMD ANH PHAM [phone] [id number] BY Berline Chough, [place] Performed at [facility], [address] [place], [place] [address]    Culture CITROBACTER FREUNDII (A)  Final   Report Status 06/14/2023 FINAL  Final   Organism ID, Bacteria CITROBACTER FREUNDII  Final      Susceptibility   Citrobacter freundii - MIC*    CEFEPIME <=0.12 SENSITIVE Sensitive     CEFTAZIDIME <=1 SENSITIVE Sensitive     CEFTRIAXONE <=0.25 SENSITIVE Sensitive     CIPROFLOXACIN <=0.25 SENSITIVE Sensitive     GENTAMICIN <=1 SENSITIVE Sensitive     IMIPENEM 0.5 SENSITIVE Sensitive     TRIMETH/SULFA <=20 SENSITIVE Sensitive     PIP/TAZO <=4 SENSITIVE Sensitive     * CITROBACTER FREUNDII  Blood Culture ID Panel (Reflexed)     Status: Abnormal   Collection Time: 06/11/23  4:05 PM  Result Value Ref Range Status   Enterococcus faecalis NOT DETECTED NOT DETECTED Final   Enterococcus Faecium NOT DETECTED NOT DETECTED Final    Listeria monocytogenes NOT DETECTED NOT DETECTED Final   Staphylococcus species NOT DETECTED NOT DETECTED Final   Staphylococcus aureus (BCID) NOT DETECTED NOT DETECTED Final   Staphylococcus epidermidis NOT DETECTED NOT DETECTED Final   Staphylococcus lugdunensis NOT DETECTED NOT DETECTED Final   Streptococcus species NOT DETECTED NOT DETECTED Final   Streptococcus agalactiae NOT DETECTED NOT DETECTED Final   Streptococcus pneumoniae NOT DETECTED NOT DETECTED Final   Streptococcus pyogenes NOT DETECTED NOT DETECTED Final   A.calcoaceticus-baumannii NOT DETECTED NOT DETECTED Final   Bacteroides fragilis NOT DETECTED NOT DETECTED Final   Enterobacterales DETECTED (A) NOT DETECTED Final    Comment: Enterobacterales represent a large order of gram negative bacteria, not a single organism. Refer to culture for further identification. CRITICAL RESULT CALLED TO, READ BACK BY AND VERIFIED WITH: PHARMD ANH PHAM [phone] [id number] BY J [id number], MT    Enterobacter cloacae  complex NOT DETECTED NOT DETECTED Final   Escherichia coli NOT DETECTED NOT DETECTED Final   Klebsiella aerogenes NOT DETECTED NOT DETECTED Final   Klebsiella oxytoca NOT DETECTED NOT DETECTED Final   Klebsiella pneumoniae NOT DETECTED NOT DETECTED Final   Proteus species NOT DETECTED NOT DETECTED Final   Salmonella species NOT DETECTED NOT DETECTED Final   Serratia marcescens NOT DETECTED NOT DETECTED Final   Haemophilus influenzae NOT DETECTED NOT DETECTED Final   Neisseria meningitidis NOT DETECTED NOT DETECTED Final   Pseudomonas aeruginosa NOT DETECTED NOT DETECTED Final   Stenotrophomonas maltophilia NOT DETECTED NOT DETECTED Final   Candida albicans NOT DETECTED NOT DETECTED Final   Candida auris NOT DETECTED NOT DETECTED Final   Candida glabrata NOT DETECTED NOT DETECTED Final   Candida krusei NOT DETECTED NOT DETECTED Final   Candida parapsilosis NOT DETECTED NOT DETECTED Final   Candida tropicalis NOT DETECTED NOT  DETECTED Final   Cryptococcus neoformans/gattii NOT DETECTED NOT DETECTED Final   CTX-M ESBL NOT DETECTED NOT DETECTED Final   Carbapenem resistance IMP NOT DETECTED NOT DETECTED Final   Carbapenem resistance KPC NOT DETECTED NOT DETECTED Final   Carbapenem resistance NDM NOT DETECTED NOT DETECTED Final   Carbapenem resist OXA 48 LIKE NOT DETECTED NOT DETECTED Final   Carbapenem resistance VIM NOT DETECTED NOT DETECTED Final    Comment: Performed at [facility], [address] [place], [place] [address]  Blood Culture (routine x 2)     Status: Abnormal   Collection Time: 06/11/23  4:08 PM   Specimen: BLOOD LEFT FOREARM  Result Value Ref Range Status   Specimen Description   Final    BLOOD LEFT FOREARM Performed at [facility], [address], [place], [place] [address]    Special Requests   Final    BOTTLES DRAWN AEROBIC AND ANAEROBIC Blood Culture adequate volume Performed at [facility], [address] [place], [place] [address]    Culture  Setup Time   Final    GRAM NEGATIVE RODS IN BOTH AEROBIC AND ANAEROBIC BOTTLES CRITICAL VALUE NOTED.  VALUE IS CONSISTENT WITH PREVIOUSLY REPORTED AND CALLED VALUE.    Culture (A)  Final    CITROBACTER FREUNDII SUSCEPTIBILITIES PERFORMED ON PREVIOUS CULTURE WITHIN THE LAST 5 DAYS. Performed at [facility], [address] [place], [place] [address]    Report Status 06/16/2023 FINAL  Final  Urine Culture     Status: Abnormal   Collection Time: 06/11/23  6:14 PM   Specimen: Urine, Random  Result Value Ref Range Status   Specimen Description   Final    URINE, RANDOM Performed at [facility], [address], [place], [place] [address]    Special Requests   Final    NONE Reflexed from [id number] Performed at [facility], [address], [place], [place] [address]    Culture >=100,000 COLONIES/mL CITROBACTER FREUNDII (A)  Final   Report Status 06/13/2023 FINAL   Final   Organism ID, Bacteria CITROBACTER FREUNDII (A)  Final      Susceptibility   Citrobacter freundii - MIC*    CEFEPIME <=0.12 SENSITIVE Sensitive     CEFTRIAXONE <=0.25 SENSITIVE Sensitive     CIPROFLOXACIN <=0.25 SENSITIVE Sensitive     GENTAMICIN <=1 SENSITIVE Sensitive     IMIPENEM 1 SENSITIVE Sensitive     NITROFURANTOIN <=16 SENSITIVE Sensitive     TRIMETH/SULFA <=20 SENSITIVE Sensitive     PIP/TAZO <=  4 SENSITIVE Sensitive     * >=100,000 COLONIES/mL CITROBACTER FREUNDII     Labs:  CBC: Recent Labs  Lab 06/15/23 1115 06/17/23 0019 06/17/23 2008 06/18/23 0419 06/19/23 0420 06/20/23 0332  WBC 9.1 6.3 5.9 4.7 4.4 12.3*  NEUTROABS 6.3  --   --   --   --   --   HGB 10.5* 10.8* 11.1* 10.8* 10.4* 9.6*  HCT 34.1* 34.0* 35.8* 33.4* 32.1* 29.8*  MCV 98.3 93.9 96.0 93.0 93.3 92.3  PLT 160 198 193 224 245 267   BMP &GFR Recent Labs  Lab 06/15/23 1115 06/16/23 1032 06/17/23 0019 06/17/23 2008 06/18/23 0419 06/19/23 0420 06/20/23 0332  NA 138 139 137 137 137 134* 134*  K 3.5 3.8 3.9 4.1 4.2 4.9 4.5  CL 107 103 102 103 103 103 105  CO2 21* 26 27 19* 24 20* 22  GLUCOSE 150* 104* 84 97 91 133* 132*  BUN 18 13 9 11 12 16 21   CREATININE 0.98 0.96 1.12 1.10 1.02 1.02 0.85  CALCIUM 7.8* 8.1* 8.1* 7.9* 8.1* 8.0* 8.2*  MG 1.4* 2.1 1.7  --   --  1.6* 2.0  PHOS 2.9 2.3* 2.1*  --   --  2.9 2.9   Estimated Creatinine Clearance: 64 mL/min (by C-G formula based on SCr of 0.85 mg/dL). Liver & Pancreas: Recent Labs  Lab 06/15/23 1115 06/16/23 1032 06/17/23 0019 06/17/23 2008 06/18/23 0419 06/19/23 0420 06/20/23 0332  AST 22 32  --  24 19  --   --   ALT 17 24  --  17 17  --   --   ALKPHOS 52 47  --  47 49  --   --   BILITOT 1.0 0.2*  --  0.8 0.7  --   --   PROT 5.4* 5.2*  --  5.5* 5.4*  --   --   ALBUMIN 2.7* 2.5* 2.5* 2.7* 2.6* 2.6* 2.6*   No results for input(s): "LIPASE", "AMYLASE" in the last 168 hours. Recent Labs  Lab 06/16/23 0241  AMMONIA 37*    Diabetic: No results for input(s): "HGBA1C" in the last 72 hours. Recent Labs  Lab 06/15/23 1107 06/16/23 2105 06/17/23 1912 06/18/23 1148  GLUCAP 150* 102* 111* 118*   Cardiac Enzymes: Recent Labs  Lab 06/16/23 1032  CKTOTAL 19*   No results for input(s): "PROBNP" in the last 8760 hours. Coagulation Profile: No results for input(s): "INR", "PROTIME" in the last 168 hours. Thyroid Function Tests: Recent Labs    06/18/23 1500  TSH 0.536   Lipid Profile: No results for input(s): "CHOL", "HDL", "LDLCALC", "TRIG", "CHOLHDL", "LDLDIRECT" in the last 72 hours. Anemia Panel: No results for input(s): "VITAMINB12", "FOLATE", "FERRITIN", "TIBC", "IRON", "RETICCTPCT" in the last 72 hours. Urine analysis:    Component Value Date/Time   COLORURINE YELLOW 06/11/2023 1814   APPEARANCEUR CLEAR 06/11/2023 1814   LABSPEC 1.010 06/11/2023 1814   PHURINE 5.0 06/11/2023 1814   GLUCOSEU NEGATIVE 06/11/2023 1814   HGBUR LARGE (A) 06/11/2023 1814   BILIRUBINUR NEGATIVE 06/11/2023 1814   KETONESUR NEGATIVE 06/11/2023 1814   PROTEINUR NEGATIVE 06/11/2023 1814   NITRITE POSITIVE (A) 06/11/2023 1814   LEUKOCYTESUR MODERATE (A) 06/11/2023 1814   Sepsis Labs: Invalid input(s): "PROCALCITONIN", "LACTICIDVEN"   SIGNED:  Almon Hercules, MD  [facility] 06/21/2023, 10:05 AM

## 2023-06-21 NOTE — Progress Notes (Signed)
[facility] Liaison Note  Referral received for patient/family interest in hospice at home. [facility] liaison spoke with patient's daughter Efraim Kaufmann and son Trey Paula to confirm interest. Interest confirmed.   Plan is to discharge home when medically ready.   No DME needs at this time.   Please send comfort medications/prescriptions with patient at discharge.   Please call with any questions or concerns. Thank you  Dionicio Stall, LCSW [facility] hospital liaison [phone]

## 2023-06-22 ENCOUNTER — Other Ambulatory Visit: Payer: Self-pay | Admitting: Student

## 2023-06-22 MED ORDER — OLANZAPINE 5 MG PO TBDP: 5.0000 mg | ORAL_TABLET | Freq: Every day | ORAL | 2 refills | Status: DC

## 2023-06-22 MED ORDER — OLANZAPINE 2.5 MG PO TABS: ORAL_TABLET | ORAL | 2 refills | Status: AC

## 2023-06-22 NOTE — Progress Notes (Signed)
"  Pharmacy needing clarification on Rx: OLANZapine zydis (ZYPREXA) 5 MG disintegrating tablet [[id number]] -stated it is a disintegrating tablet can not be cut in half, and his insurance holds him to a max of 30 tablets in 30 days" Patient does on Zyprexa disintegrating tablet 2.5 mg in the morning and 5 mg at night.  Change at disintegrating tablet to regular tablet.  Sent new prescription to the pharmacy electronically.

## 2023-07-22 ENCOUNTER — Other Ambulatory Visit (HOSPITAL_COMMUNITY): Payer: Self-pay

## 2023-07-26 ENCOUNTER — Ambulatory Visit: Payer: PPO | Admitting: Neurology

## 2023-08-23 ENCOUNTER — Encounter: Payer: Self-pay | Admitting: Podiatry

## 2023-08-23 ENCOUNTER — Ambulatory Visit: Payer: PPO | Admitting: Podiatry

## 2023-08-23 DIAGNOSIS — M79674 Pain in right toe(s): Secondary | ICD-10-CM | POA: Diagnosis not present

## 2023-08-23 DIAGNOSIS — B351 Tinea unguium: Secondary | ICD-10-CM

## 2023-08-23 NOTE — Progress Notes (Signed)
   Chief Complaint  Patient presents with   Toe Pain    PATIENT STATES HE HAS A LOT OF PAIN IN  RIGHT HALLUX'S , FOR ABOUT A MONTH , PATIENT HAS NOT TAKEN ANY MEDICATION FOR PAIN, PATIENT RIGHT ANKLE IS SWELLED UP ALSO    HPI: 87 y.o. male on hospice care presenting today with his spouse as a new patient for evaluation of pain and tenderness associated to the right great toenail plate.  They have not done anything for treatment.  Gradual onset over the last few months  Past Medical History:  Diagnosis Date   Blood clots in brain    Tumors    in head    History reviewed. No pertinent surgical history.  Allergies  Allergen Reactions   Iodinated Contrast Media Itching   Tamsulosin Other (See Comments)    Upsets stomach   Valproic Acid And Related     Hyperammonemia     Physical Exam: General: The patient is alert and oriented x3 in no acute distress.  Dermatology: Skin is warm, dry and supple bilateral lower extremities.  Hyperkeratotic dystrophic nail plate noted to the right hallux nail plate  Vascular: Palpable pedal pulses bilaterally. Capillary refill within normal limits.  No appreciable edema.  No erythema.  Neurological: Grossly intact via light touch  Musculoskeletal Exam: No pedal deformities noted   Assessment/Plan of Care: 1.  Pain due to onychomycosis of toenail right hallux nail plate  -Patient evaluated -Mechanical debridement of the right hallux nail plate was performed today using a nail nipper without incident or bleeding.  The patient did feel relief -Maintain good foot hygiene -Return to clinic as needed       Felecia Shelling, DPM [facility]  Dr. Felecia Shelling, DPM    [address] [place], [place] [address]                Office [phone]  Fax [phone]

## 2024-05-02 DEATH — deceased
